# Patient Record
Sex: Female | Born: 1965 | Race: White | Hispanic: No | Smoking: Never smoker
Health system: Southern US, Community
[De-identification: ages and names within clinical notes are randomized; demographics above are authoritative.]

## PROBLEM LIST (undated history)

## (undated) DIAGNOSIS — D649 Anemia, unspecified: Secondary | ICD-10-CM

## (undated) DIAGNOSIS — E119 Type 2 diabetes mellitus without complications: Secondary | ICD-10-CM

## (undated) DIAGNOSIS — E785 Hyperlipidemia, unspecified: Secondary | ICD-10-CM

## (undated) DIAGNOSIS — Z9889 Other specified postprocedural states: Secondary | ICD-10-CM

## (undated) DIAGNOSIS — Z5189 Encounter for other specified aftercare: Secondary | ICD-10-CM

## (undated) DIAGNOSIS — K219 Gastro-esophageal reflux disease without esophagitis: Secondary | ICD-10-CM

## (undated) DIAGNOSIS — E282 Polycystic ovarian syndrome: Secondary | ICD-10-CM

## (undated) DIAGNOSIS — G4730 Sleep apnea, unspecified: Secondary | ICD-10-CM

## (undated) DIAGNOSIS — I10 Essential (primary) hypertension: Secondary | ICD-10-CM

## (undated) DIAGNOSIS — R112 Nausea with vomiting, unspecified: Secondary | ICD-10-CM

## (undated) HISTORY — DX: Hyperlipidemia, unspecified: E78.5

## (undated) HISTORY — DX: Sleep apnea, unspecified: G47.30

## (undated) HISTORY — DX: Gastro-esophageal reflux disease without esophagitis: K21.9

## (undated) HISTORY — PX: CHOLECYSTECTOMY: SHX55

## (undated) HISTORY — DX: Polycystic ovarian syndrome: E28.2

## (undated) HISTORY — DX: Type 2 diabetes mellitus without complications: E11.9

## (undated) HISTORY — DX: Essential (primary) hypertension: I10

## (undated) HISTORY — PX: APPENDECTOMY: SHX54

---

## 1989-05-18 HISTORY — PX: CHOLECYSTECTOMY: SHX55

## 1997-07-01 ENCOUNTER — Observation Stay (HOSPITAL_COMMUNITY): Admission: AD | Admit: 1997-07-01 | Discharge: 1997-07-01 | Payer: Self-pay | Admitting: Obstetrics and Gynecology

## 1997-09-11 ENCOUNTER — Encounter (HOSPITAL_COMMUNITY): Admission: RE | Admit: 1997-09-11 | Discharge: 1997-10-10 | Payer: Self-pay | Admitting: Obstetrics and Gynecology

## 1997-10-09 ENCOUNTER — Inpatient Hospital Stay (HOSPITAL_COMMUNITY): Admission: AD | Admit: 1997-10-09 | Discharge: 1997-10-12 | Payer: Self-pay | Admitting: Obstetrics and Gynecology

## 2001-08-14 ENCOUNTER — Encounter: Payer: Self-pay | Admitting: Emergency Medicine

## 2001-08-14 ENCOUNTER — Emergency Department (HOSPITAL_COMMUNITY): Admission: EM | Admit: 2001-08-14 | Discharge: 2001-08-14 | Payer: Self-pay | Admitting: Emergency Medicine

## 2005-08-05 ENCOUNTER — Encounter: Admission: RE | Admit: 2005-08-05 | Discharge: 2005-09-03 | Payer: Self-pay | Admitting: Orthopedic Surgery

## 2007-11-07 ENCOUNTER — Emergency Department (HOSPITAL_COMMUNITY): Admission: EM | Admit: 2007-11-07 | Discharge: 2007-11-07 | Payer: Self-pay | Admitting: Emergency Medicine

## 2007-12-07 ENCOUNTER — Emergency Department (HOSPITAL_COMMUNITY): Admission: EM | Admit: 2007-12-07 | Discharge: 2007-12-07 | Payer: Self-pay | Admitting: Family Medicine

## 2011-02-12 LAB — POCT PREGNANCY, URINE
Operator id: 239701
Preg Test, Ur: NEGATIVE

## 2013-04-17 ENCOUNTER — Encounter: Payer: Self-pay | Admitting: Obstetrics and Gynecology

## 2013-04-17 ENCOUNTER — Ambulatory Visit (INDEPENDENT_AMBULATORY_CARE_PROVIDER_SITE_OTHER): Payer: No Typology Code available for payment source | Admitting: Obstetrics and Gynecology

## 2013-04-17 ENCOUNTER — Other Ambulatory Visit (HOSPITAL_COMMUNITY)
Admission: RE | Admit: 2013-04-17 | Discharge: 2013-04-17 | Disposition: A | Discharge status: Home / Self Care | Payer: No Typology Code available for payment source | Source: Ambulatory Visit | Attending: Obstetrics and Gynecology | Admitting: Obstetrics and Gynecology

## 2013-04-17 VITALS — BP 136/90 | HR 85 | Temp 97.6°F | Ht 61.0 in | Wt 221.5 lb

## 2013-04-17 DIAGNOSIS — N939 Abnormal uterine and vaginal bleeding, unspecified: Secondary | ICD-10-CM | POA: Insufficient documentation

## 2013-04-17 DIAGNOSIS — N926 Irregular menstruation, unspecified: Secondary | ICD-10-CM | POA: Insufficient documentation

## 2013-04-17 DIAGNOSIS — N946 Dysmenorrhea, unspecified: Secondary | ICD-10-CM | POA: Insufficient documentation

## 2013-04-17 DIAGNOSIS — Z23 Encounter for immunization: Secondary | ICD-10-CM

## 2013-04-17 LAB — POCT PREGNANCY, URINE: Preg Test, Ur: NEGATIVE

## 2013-04-17 MED ORDER — MEGESTROL ACETATE 20 MG PO TABS: 20.0000 mg | ORAL_TABLET | Freq: Every day | ORAL | Status: DC

## 2013-04-17 NOTE — Addendum Note (Signed)
Addended by: Louanna Raw on: 04/17/2013 04:05 PM   Modules accepted: Orders

## 2013-04-17 NOTE — Progress Notes (Signed)
   Subjective:    Patient ID: Teresa Camacho, female    DOB: 03/28/1966, 47 y.o.   MRN: [id number]  HPI 47 yo G4P5 with BMI 41 presenting today for evaluation of abnormal vaginal bleeding. Patient states that over the past several months her menses has lasted longer (7-10 days) and are much heavier with passage of large clots accompanied by severe cramping pain. Patient states her last period lasted 3 weeks. Patient reports being diagnosed with PCOS in the past and required infertility treatment to conceive.   Past Medical History  Diagnosis Date  . PCOS (polycystic ovarian syndrome)   . Sleep apnea    Past Surgical History  Procedure Laterality Date  . Appendectomy    . Cholecystectomy    . Cesarean section      3x   Family History  Problem Relation Age of Onset  . Cancer Mother   . Depression Mother   . Diabetes Mother   . Arthritis Mother   . Heart disease Mother   . Learning disabilities Mother   . Mental illness Mother   . Cancer Father   . Arthritis Father   . Cancer Maternal Aunt     x 2 Aunts   History  Substance Use Topics  . Smoking status: Never Smoker   . Smokeless tobacco: Not on file  . Alcohol Use: No      Review of Systems     Objective:   Physical Exam  GENERAL: Well-developed, well-nourished female in no acute distress. obese  ABDOMEN: Soft, nontender, nondistended. Obese PELVIC: Normal external female genitalia. Vagina is pink and rugated.  Normal discharge. Normal appearing cervix. Bimanual exam limted secondary to size EXTREMITIES: No cyanosis, clubbing, or edema, 2+ distal pulses.      Assessment & Plan:  47 yo with abnormal uterine bleeding - Discussed the need for endometrial biopsy ENDOMETRIAL BIOPSY     The indications for endometrial biopsy were reviewed.   Risks of the biopsy including cramping, bleeding, infection, uterine perforation, inadequate specimen and need for additional procedures  were discussed. The patient states she  understands and agrees to undergo procedure today. Consent was signed. Time out was performed. Urine HCG was negative. A sterile speculum was placed in the patient's vagina and the cervix was prepped with Betadine. A single-toothed tenaculum was placed on the anterior lip of the cervix to stabilize it. The uterine cavity was sounded to a depth of 7 cm using the uterine sound. The 3 mm pipelle was introduced into the endometrial cavity without difficulty, 2 passes were made.  A  moderate amount of tissue was  sent to pathology. The instruments were removed from the patient's vagina. Minimal bleeding from the cervix was noted. The patient tolerated the procedure well.  Routine post-procedure instructions were given to the patient.   - Pelvic ultrasound ordered - RX megace provided - RTC in 2 weeks for results and further management

## 2013-04-17 NOTE — Progress Notes (Signed)
Pt states she bleed for 3 weeks on last period

## 2013-04-18 ENCOUNTER — Ambulatory Visit (HOSPITAL_COMMUNITY)
Admission: RE | Admit: 2013-04-18 | Discharge: 2013-04-18 | Disposition: A | Discharge status: Home / Self Care | Payer: No Typology Code available for payment source | Source: Ambulatory Visit | Attending: Obstetrics and Gynecology | Admitting: Obstetrics and Gynecology

## 2013-04-18 DIAGNOSIS — N938 Other specified abnormal uterine and vaginal bleeding: Secondary | ICD-10-CM | POA: Insufficient documentation

## 2013-04-18 DIAGNOSIS — N939 Abnormal uterine and vaginal bleeding, unspecified: Secondary | ICD-10-CM

## 2013-04-18 DIAGNOSIS — N925 Other specified irregular menstruation: Secondary | ICD-10-CM | POA: Insufficient documentation

## 2013-04-18 DIAGNOSIS — N949 Unspecified condition associated with female genital organs and menstrual cycle: Secondary | ICD-10-CM | POA: Insufficient documentation

## 2013-04-18 DIAGNOSIS — D250 Submucous leiomyoma of uterus: Secondary | ICD-10-CM | POA: Insufficient documentation

## 2013-04-19 ENCOUNTER — Encounter: Payer: Self-pay | Admitting: *Deleted

## 2013-04-19 ENCOUNTER — Telehealth: Payer: Self-pay | Admitting: *Deleted

## 2013-04-19 NOTE — Telephone Encounter (Signed)
[employer] and explained results per Dr. Jolayne Panther and reviewed appointment with her to discuss plan of care for her bleeding.  Teresa Camacho voices understanding. Teresa Camacho also states she did start taking the megace because she started bleeding, but was still bleeding. Reviewed with her to continue taking one a day as prescribed and if that does not help reduce her bleeding within a few days to call back as we may need to adjust dosage. She also asked if she would need to have another biopsy and I informed her to discuss that with Dr. Jolayne Panther at her visit as it may depend on how her bleeding has been ,etc.

## 2013-04-19 NOTE — Telephone Encounter (Signed)
Message copied by Gerome Apley on Wed Apr 19, 2013  4:21 PM ------      Message from: CONSTANT, PEGGY      Created: Wed Apr 19, 2013 12:16 PM       Please inform patient of inadequate biopsy results- no evidence of cancer but too small of a sample to be fully evaluated.            Ultrasound demonstrated the presence of a small fibroid which may be responsible for her abnormal bleeding.            Patient should keep follow up appointment to discuss further management of her bleeding            Thanks            Peggy ------

## 2013-04-20 ENCOUNTER — Telehealth: Payer: Self-pay

## 2013-04-20 NOTE — Telephone Encounter (Signed)
Pt. Called front desk. Called pt. Back. Pt. Stated she wants to have endometrial biopsy repeated prior to her next appointment. Informed pt. That unfortunately we are booked and the best option would be to keep her appointment, talk with Dr. Jolayne Panther and that appointment and discuss doing another endometrial biopsy then-- stated there would probably be no problem with repeating it. Pt. Wondered if there was a cancellation list. Informed pt. That I can put her on one, however, it is best for her to see Dr. Jolayne Panther whose true availability for GYN clinic is not until the date of pt. Appointment. Explained we would do what we could. Pt. Verbalized understanding and gratitude and had no other questions or concerns.

## 2013-04-20 NOTE — Telephone Encounter (Signed)
Called pt. Home phone. Unable to leave message. Left message on cell phone stating we are returning her call and to call clinic.

## 2013-04-20 NOTE — Telephone Encounter (Signed)
Pt. Called stating that a nurse had called her about some results but she has some more questions. Requesting a nurse to call back.

## 2013-04-27 ENCOUNTER — Other Ambulatory Visit: Payer: Self-pay | Admitting: Obstetrics and Gynecology

## 2013-04-27 ENCOUNTER — Ambulatory Visit (HOSPITAL_COMMUNITY)
Admission: RE | Admit: 2013-04-27 | Discharge: 2013-04-27 | Disposition: A | Discharge status: Home / Self Care | Payer: No Typology Code available for payment source | Source: Ambulatory Visit | Attending: Obstetrics and Gynecology | Admitting: Obstetrics and Gynecology

## 2013-04-27 DIAGNOSIS — N939 Abnormal uterine and vaginal bleeding, unspecified: Secondary | ICD-10-CM

## 2013-04-27 DIAGNOSIS — Z1231 Encounter for screening mammogram for malignant neoplasm of breast: Secondary | ICD-10-CM

## 2013-05-03 ENCOUNTER — Encounter: Payer: Self-pay | Admitting: Obstetrics and Gynecology

## 2013-05-03 ENCOUNTER — Ambulatory Visit (INDEPENDENT_AMBULATORY_CARE_PROVIDER_SITE_OTHER): Payer: No Typology Code available for payment source | Admitting: Obstetrics and Gynecology

## 2013-05-03 ENCOUNTER — Other Ambulatory Visit (HOSPITAL_COMMUNITY)
Admission: RE | Admit: 2013-05-03 | Discharge: 2013-05-03 | Disposition: A | Discharge status: Home / Self Care | Payer: No Typology Code available for payment source | Source: Ambulatory Visit | Attending: Obstetrics and Gynecology | Admitting: Obstetrics and Gynecology

## 2013-05-03 VITALS — BP 119/73 | HR 91 | Temp 98.2°F | Ht 65.0 in | Wt 214.7 lb

## 2013-05-03 DIAGNOSIS — D250 Submucous leiomyoma of uterus: Secondary | ICD-10-CM

## 2013-05-03 DIAGNOSIS — N926 Irregular menstruation, unspecified: Secondary | ICD-10-CM

## 2013-05-03 DIAGNOSIS — Z3202 Encounter for pregnancy test, result negative: Secondary | ICD-10-CM

## 2013-05-03 DIAGNOSIS — N939 Abnormal uterine and vaginal bleeding, unspecified: Secondary | ICD-10-CM

## 2013-05-03 HISTORY — DX: Submucous leiomyoma of uterus: D25.0

## 2013-05-03 LAB — POCT PREGNANCY, URINE: Preg Test, Ur: NEGATIVE

## 2013-05-03 MED ORDER — MEGESTROL ACETATE 20 MG PO TABS: 20.0000 mg | ORAL_TABLET | Freq: Two times a day (BID) | ORAL | Status: DC

## 2013-05-03 NOTE — Patient Instructions (Addendum)
Uterine Fibroid A uterine fibroid is a growth (tumor) that occurs in a woman's uterus. This type of tumor is not cancerous and does not spread out of the uterus. A woman can have one or many fibroids, and the fiboid(s) can become quite large. A fibroid can vary in size, weight, and where it grows in the uterus. Most fibroids do not require medical treatment, but some can cause pain or heavy bleeding during and between periods. CAUSES  A fibroid is the result of a single uterine cell that keeps growing (unregulated), which is different than most cells in the human body. Most cells have a control mechanism that keeps them from reproducing without control.  SYMPTOMS   Bleeding.  Pelvic pain and pressure.  Bladder problems due to the size of the fibroid.  Infertility and miscarriages depending on the size and location of the fibroid. DIAGNOSIS  A diagnosis is made by physical exam. Your caregiver may feel the lumpy tumors during a pelvic exam. Important information regarding size, location, and number of tumors can be gained by having an ultrasound. It is rare that other tests, such as a CT scan or MRI, are needed. TREATMENT   Your caregiver may recommend watchful waiting. This involves getting the fibroid checked by your caregiver to see if the fibroids grow or shrink.   Hormonal treatment or an intrauterine device (IUD) may be prescribed.   Surgery may be needed to remove the fibroids (myomectomy) or the uterus (hysterectomy). This depends on your situation. When fibroids interfere with fertility and a woman wants to become pregnant, a caregiver may recommend having the fibroids removed.  HOME CARE INSTRUCTIONS  Home care depends on how you were treated. In general:   Keep all follow-up appointments with your caregiver.   Only take medicine as told by your caregiver. Do not take aspirin. It can cause bleeding.   If you have excessive periods and soak tampons or pads in a half hour or  less, contact your caregiver immediately. If your periods are troublesome but not so heavy, lie down with your feet raised slightly above your heart. Place cold packs on your lower abdomen.   If your periods are heavy, write down the number of pads or tampons you use per month. Bring this information to your caregiver.   Talk to your caregiver about taking iron pills.   Include green vegetables in your diet.   If you were prescribed a hormonal treatment, take the hormonal medicines as directed.   If you need surgery, ask your caregiver for information on your specific surgery.  SEEK IMMEDIATE MEDICAL CARE IF:  You have pelvic pain or cramps not controlled with medicines.   You have a sudden increase in pelvic pain.   You have an increase of bleeding between and during periods.   You feel lightheaded or have fainting episodes.  MAKE SURE YOU:  Understand these instructions.  Will watch your condition.  Will get help right away if you are not doing well or get worse. Document Released: 05/01/2000 Document Revised: 07/27/2011 Document Reviewed: 12/01/2012 [facility] Patient Information 2014 [place], [place]. Medroxyprogesterone injection [Contraceptive] What is this medicine? MEDROXYPROGESTERONE (me DROX ee proe JES te rone) contraceptive injections prevent pregnancy. They provide effective birth control for 3 months. Depo-subQ Provera 104 is also used for treating pain related to endometriosis. This medicine may be used for other purposes; ask your health care provider or pharmacist if you have questions. COMMON BRAND NAME(S): Depo-Provera, Depo-subQ Provera 104 What  should I tell my health care provider before I take this medicine? They need to know if you have any of these conditions: -frequently drink alcohol -asthma -blood vessel disease or a history of a blood clot in the lungs or legs -bone disease such as osteoporosis -breast cancer -diabetes -eating disorder  (anorexia nervosa or bulimia) -high blood pressure -HIV infection or AIDS -kidney disease -liver disease -mental depression -migraine -seizures (convulsions) -stroke -tobacco smoker -vaginal bleeding -an unusual or allergic reaction to medroxyprogesterone, other hormones, medicines, foods, dyes, or preservatives -pregnant or trying to get pregnant -breast-feeding How should I use this medicine? Depo-Provera Contraceptive injection is given into a muscle. Depo-subQ Provera 104 injection is given under the skin. These injections are given by a health care professional. You must not be pregnant before getting an injection. The injection is usually given during the first 5 days after the start of a menstrual period or 6 weeks after delivery of a baby. Talk to your pediatrician regarding the use of this medicine in children. Special care may be needed. These injections have been used in female children who have started having menstrual periods. Overdosage: If you think you have taken too much of this medicine contact a poison control center or emergency room at once. NOTE: This medicine is only for you. Do not share this medicine with others. What if I miss a dose? Try not to miss a dose. You must get an injection once every 3 months to maintain birth control. If you cannot keep an appointment, call and reschedule it. If you wait longer than 13 weeks between Depo-Provera contraceptive injections or longer than 14 weeks between Depo-subQ Provera 104 injections, you could get pregnant. Use another method for birth control if you miss your appointment. You may also need a pregnancy test before receiving another injection. What may interact with this medicine? Do not take this medicine with any of the following medications: -bosentan This medicine may also interact with the following medications: -aminoglutethimide -antibiotics or medicines for infections, especially rifampin, rifabutin,  rifapentine, and griseofulvin -aprepitant -barbiturate medicines such as phenobarbital or primidone -bexarotene -carbamazepine -medicines for seizures like ethotoin, felbamate, oxcarbazepine, phenytoin, topiramate -modafinil -St. John's wort This list may not describe all possible interactions. Give your health care provider a list of all the medicines, herbs, non-prescription drugs, or dietary supplements you use. Also tell them if you smoke, drink alcohol, or use illegal drugs. Some items may interact with your medicine. What should I watch for while using this medicine? This drug does not protect you against HIV infection (AIDS) or other sexually transmitted diseases. Use of this product may cause you to lose calcium from your bones. Loss of calcium may cause weak bones (osteoporosis). Only use this product for more than 2 years if other forms of birth control are not right for you. The longer you use this product for birth control the more likely you will be at risk for weak bones. Ask your health care professional how you can keep strong bones. You may have a change in bleeding pattern or irregular periods. Many females stop having periods while taking this drug. If you have received your injections on time, your chance of being pregnant is very low. If you think you may be pregnant, see your health care professional as soon as possible. Tell your health care professional if you want to get pregnant within the next year. The effect of this medicine may last a long time after you get your  last injection. What side effects may I notice from receiving this medicine? Side effects that you should report to your doctor or health care professional as soon as possible: -allergic reactions like skin rash, itching or hives, swelling of the face, lips, or tongue -breast tenderness or discharge -breathing problems -changes in vision -depression -feeling faint or lightheaded, falls -fever -pain in  the abdomen, chest, groin, or leg -problems with balance, talking, walking -unusually weak or tired -yellowing of the eyes or skin Side effects that usually do not require medical attention (report to your doctor or health care professional if they continue or are bothersome): -acne -fluid retention and swelling -headache -irregular periods, spotting, or absent periods -temporary pain, itching, or skin reaction at site where injected -weight gain This list may not describe all possible side effects. Call your doctor for medical advice about side effects. You may report side effects to FDA at 1-800-FDA-1088. Where should I keep my medicine? This does not apply. The injection will be given to you by a health care professional. NOTE: This sheet is a summary. It may not cover all possible information. If you have questions about this medicine, talk to your doctor, pharmacist, or health care provider.  2014, Elsevier/Gold Standard. (2008-05-25 18:37:56) Levonorgestrel intrauterine device (IUD) What is this medicine? LEVONORGESTREL IUD (LEE voe nor jes trel) is a contraceptive (birth control) device. The device is placed inside the uterus by a healthcare professional. It is used to prevent pregnancy and can also be used to treat heavy bleeding that occurs during your period. Depending on the device, it can be used for 3 to 5 years. This medicine may be used for other purposes; ask your health care provider or pharmacist if you have questions. COMMON BRAND NAME(S): Gretta Cool What should I tell my health care provider before I take this medicine? They need to know if you have any of these conditions: -abnormal Pap smear -cancer of the breast, uterus, or cervix -diabetes -endometritis -genital or pelvic infection now or in the past -have more than one sexual partner or your partner has more than one partner -heart disease -history of an ectopic or tubal pregnancy -immune system  problems -IUD in place -liver disease or tumor -problems with blood clots or take blood-thinners -use intravenous drugs -uterus of unusual shape -vaginal bleeding that has not been explained -an unusual or allergic reaction to levonorgestrel, other hormones, silicone, or polyethylene, medicines, foods, dyes, or preservatives -pregnant or trying to get pregnant -breast-feeding How should I use this medicine? This device is placed inside the uterus by a health care professional. Talk to your pediatrician regarding the use of this medicine in children. Special care may be needed. Overdosage: If you think you have taken too much of this medicine contact a poison control center or emergency room at once. NOTE: This medicine is only for you. Do not share this medicine with others. What if I miss a dose? This does not apply. What may interact with this medicine? Do not take this medicine with any of the following medications: -amprenavir -bosentan -fosamprenavir This medicine may also interact with the following medications: -aprepitant -barbiturate medicines for inducing sleep or treating seizures -bexarotene -griseofulvin -medicines to treat seizures like carbamazepine, ethotoin, felbamate, oxcarbazepine, phenytoin, topiramate -modafinil -pioglitazone -rifabutin -rifampin -rifapentine -some medicines to treat HIV infection like atazanavir, indinavir, lopinavir, nelfinavir, tipranavir, ritonavir -St. John's wort -warfarin This list may not describe all possible interactions. Give your health care provider a list of all the  medicines, herbs, non-prescription drugs, or dietary supplements you use. Also tell them if you smoke, drink alcohol, or use illegal drugs. Some items may interact with your medicine. What should I watch for while using this medicine? Visit your doctor or health care professional for regular check ups. See your doctor if you or your partner has sexual contact with  others, becomes HIV positive, or gets a sexual transmitted disease. This product does not protect you against HIV infection (AIDS) or other sexually transmitted diseases. You can check the placement of the IUD yourself by reaching up to the top of your vagina with clean fingers to feel the threads. Do not pull on the threads. It is a good habit to check placement after each menstrual period. Call your doctor right away if you feel more of the IUD than just the threads or if you cannot feel the threads at all. The IUD may come out by itself. You may become pregnant if the device comes out. If you notice that the IUD has come out use a backup birth control method like condoms and call your health care provider. Using tampons will not change the position of the IUD and are okay to use during your period. What side effects may I notice from receiving this medicine? Side effects that you should report to your doctor or health care professional as soon as possible: -allergic reactions like skin rash, itching or hives, swelling of the face, lips, or tongue -fever, flu-like symptoms -genital sores -high blood pressure -no menstrual period for 6 weeks during use -pain, swelling, warmth in the leg -pelvic pain or tenderness -severe or sudden headache -signs of pregnancy -stomach cramping -sudden shortness of breath -trouble with balance, talking, or walking -unusual vaginal bleeding, discharge -yellowing of the eyes or skin Side effects that usually do not require medical attention (report to your doctor or health care professional if they continue or are bothersome): -acne -breast pain -change in sex drive or performance -changes in weight -cramping, dizziness, or faintness while the device is being inserted -headache -irregular menstrual bleeding within first 3 to 6 months of use -nausea This list may not describe all possible side effects. Call your doctor for medical advice about side  effects. You may report side effects to FDA at 1-800-FDA-1088. Where should I keep my medicine? This does not apply. NOTE: This sheet is a summary. It may not cover all possible information. If you have questions about this medicine, talk to your doctor, pharmacist, or health care provider.  2014, Elsevier/Gold Standard. (2011-06-04 13:54:04) Endometrial Ablation Endometrial ablation removes the lining of the uterus (endometrium). It is usually a same-day, outpatient treatment. Ablation helps avoid major surgery, such as surgery to remove the cervix and uterus (hysterectomy). After endometrial ablation, you will have little or no menstrual bleeding and may not be able to have children. However, if you are premenopausal, you will need to use a reliable method of birth control following the procedure because of the small chance that pregnancy can occur. There are different reasons to have this procedure, which include:  Heavy periods.  Bleeding that is causing anemia.  Irregular bleeding.  Bleeding fibroids on the lining inside the uterus if they are smaller than 3 centimeters. This procedure should not be done if:  You want children in the future.  You have severe cramps with your menstrual period.  You have precancerous or cancerous cells in your uterus.  You were recently pregnant.  You have gone through  menopause.  You have had major surgery on the uterus, such as a cesarean delivery. LET [facility] CARE PROVIDER KNOW ABOUT:  Any allergies you have.  All medicines you are taking, including vitamins, herbs, eye drops, creams, and over-the-counter medicines.  Previous problems you or members of your family have had with the use of anesthetics.  Any blood disorders you have.  Previous surgeries you have had.  Medical conditions you have. RISKS AND COMPLICATIONS  Generally, this is a safe procedure. However, as with any procedure, complications can occur. Possible  complications include:  Perforation of the uterus.  Bleeding.  Infection of the uterus, bladder, or vagina.  Injury to surrounding organs.  An air bubble to the lung (air embolus).  Pregnancy following the procedure.  Failure of the procedure to help the problem, requiring hysterectomy.  Decreased ability to diagnose cancer in the lining of the uterus. BEFORE THE PROCEDURE  The lining of the uterus must be tested to make sure there is no pre-cancerous or cancer cells present.  An ultrasound may be performed to look at the size of the uterus and to check for abnormalities.  Medicines may be given to thin the lining of the uterus. PROCEDURE  During the procedure, your health care provider will use a tool called a resectoscope to help see inside your uterus. There are different ways to remove the lining of your uterus.   Radiofrequency  This method uses a radiofrequency-alternating electric current to remove the lining of the uterus.  Cryotherapy This method uses extreme cold to freeze the lining of the uterus.  Heated-Free Liquid  This method uses heated salt (saline) solution to remove the lining of the uterus.  Microwave This method uses high-energy microwaves to heat up the lining of the uterus to remove it.  Thermal balloon  This method involves inserting a catheter with a balloon tip into the uterus. The balloon tip is filled with heated fluid to remove the lining of the uterus. AFTER THE PROCEDURE  After your procedure, do not have sexual intercourse or insert anything into your vagina until permitted by your health care provider. After the procedure, you may experience:  Cramps.  Vaginal discharge.  Frequent urination. Document Released: 03/13/2004 Document Revised: 01/04/2013 Document Reviewed: 10/05/2012 [facility] Patient Information 2014 [place], [place].

## 2013-05-03 NOTE — Progress Notes (Signed)
Patient ID: Teresa Camacho, female   DOB: 1965/08/27, 47 y.o.   MRN: [id number] Patient here for repeat endometrial biopsy secondary to insufficient specimen last time. Results of the ultrasound were also reviewed and explained  FINDINGS:  Uterus  Measurements: 13.4 x 8.1 x 8.2 cm. Prior C-section scar noted. A  submucosal fibroid is seen in the right uterine corpus which indents  the endometrial stripe. This measures 2.7 x 2.7 x 2.1 cm.  Endometrium  Thickness: 16 mm. No focal abnormality visualized other than  submucosal fibroid noted above.  Right ovary  Measurements: Not directly visualized by transabdominal or  transvaginal sonography, however no adnexal mass identified.  Left ovary  Measurements: Not directly visualized by transabdominal or  transvaginal sonography, however no adnexal mass identified.  Other findings  No free fluid.  IMPRESSION:  2.7 cm submucosal fibroid in the right uterine corpus, which indents  the endometrial stripe.  Endometrial thickness measures 16 mm. If bleeding remains  unresponsive to hormonal or medical therapy, focal lesion work-up  with sonohysterogram should be considered. Endometrial biopsy should  also be considered in pre-menopausal patients at high risk for  endometrial carcinoma. (Ref: Radiological Reasoning: Algorithmic  Workup of Abnormal Vaginal Bleeding with Endovaginal Sonography and  Sonohysterography. AJR 2008; 295:M84-13).  Nonvisualization of ovaries, however no adnexal mass identified.   ENDOMETRIAL BIOPSY     The indications for endometrial biopsy were reviewed.   Risks of the biopsy including cramping, bleeding, infection, uterine perforation, inadequate specimen and need for additional procedures  were discussed. The patient states she understands and agrees to undergo procedure today. Consent was signed. Time out was performed. Urine HCG was negative. A sterile speculum was placed in the patient's vagina and the cervix was  prepped with Betadine. A single-toothed tenaculum was placed on the anterior lip of the cervix to stabilize it. The uterine cavity was sounded to a depth of 12 cm using the uterine sound. The 3 mm pipelle was introduced into the endometrial cavity without difficulty, 2 passes were made.  A  moderate amount of tissue was  sent to pathology. The instruments were removed from the patient's vagina. Minimal bleeding from the cervix was noted. The patient tolerated the procedure well.  Routine post-procedure instructions were given to the patient. The patient will follow up in two weeks to review the results and for further management.   A/P 47 yo with abnormal uterine bleeding and submucosal fibroid - Repeat endometrial biopsy performed today - Discussed medical management with depo-Provera, Mirena IUD or endometrial ablation. Reading information provided - patient will be contacted with any abnormal results and will let us know what her management plan will be after reviewing her options

## 2013-05-05 ENCOUNTER — Telehealth: Payer: Self-pay | Admitting: *Deleted

## 2013-05-05 ENCOUNTER — Encounter: Payer: Self-pay | Admitting: *Deleted

## 2013-05-05 NOTE — Telephone Encounter (Addendum)
Message copied by Jill Side on Fri May 05, 2013 12:12 PM ------      Message from: Catalina Antigua      Created: Fri May 05, 2013  7:57 AM       Please inform patient of negative endometrial biopsy results. Patient was counseled last time regarding medical management (depo-provera or Mirena IUD) vs surgical management with endometrial ablation.      If patient voices a preference for endometrial ablation, please let me know so that I can schedule her with [place]      If she chooses depo-provera, please schedule her for nursing visit at her convenience      If she choosed IUD, she may keep her 2 week f/u appointment for IUD insertion            Thanks            Peggy            ----- Message -----         From: [facility] [place] [facility]         Sent: 05/03/2013   2:01 PM           To: Catalina Antigua, MD                   ------ Called pt and informed her of negative endometrial biopsy results. I asked if she has decided on a treatment option for her abnormal bleeding. She stated that she is still thinking about it. I reviewed the options again per Dr. Deretha Emory note and pt had no questions. She will call for appt after making her decision.   Diane Day RNC

## 2013-05-05 NOTE — Telephone Encounter (Signed)
Erroneous encounter

## 2013-05-15 ENCOUNTER — Encounter: Payer: Self-pay | Admitting: *Deleted

## 2013-05-25 ENCOUNTER — Other Ambulatory Visit: Payer: Self-pay | Admitting: Advanced Practice Midwife

## 2013-05-25 ENCOUNTER — Encounter (HOSPITAL_COMMUNITY): Payer: Self-pay | Admitting: Emergency Medicine

## 2013-05-25 ENCOUNTER — Observation Stay (HOSPITAL_COMMUNITY)
Admission: EM | Admit: 2013-05-25 | Discharge: 2013-05-27 | Disposition: A | Discharge status: Home / Self Care | Payer: No Typology Code available for payment source | Attending: Obstetrics & Gynecology | Admitting: Obstetrics & Gynecology

## 2013-05-25 DIAGNOSIS — N390 Urinary tract infection, site not specified: Secondary | ICD-10-CM | POA: Insufficient documentation

## 2013-05-25 DIAGNOSIS — N939 Abnormal uterine and vaginal bleeding, unspecified: Secondary | ICD-10-CM

## 2013-05-25 DIAGNOSIS — D250 Submucous leiomyoma of uterus: Secondary | ICD-10-CM | POA: Insufficient documentation

## 2013-05-25 DIAGNOSIS — N920 Excessive and frequent menstruation with regular cycle: Principal | ICD-10-CM | POA: Insufficient documentation

## 2013-05-25 DIAGNOSIS — N949 Unspecified condition associated with female genital organs and menstrual cycle: Secondary | ICD-10-CM | POA: Insufficient documentation

## 2013-05-25 DIAGNOSIS — N925 Other specified irregular menstruation: Secondary | ICD-10-CM | POA: Insufficient documentation

## 2013-05-25 DIAGNOSIS — D649 Anemia, unspecified: Secondary | ICD-10-CM | POA: Insufficient documentation

## 2013-05-25 DIAGNOSIS — R55 Syncope and collapse: Secondary | ICD-10-CM | POA: Insufficient documentation

## 2013-05-25 DIAGNOSIS — N938 Other specified abnormal uterine and vaginal bleeding: Secondary | ICD-10-CM | POA: Diagnosis present

## 2013-05-25 LAB — COMPREHENSIVE METABOLIC PANEL
ALT: 15 U/L (ref 0–35)
AST: 15 U/L (ref 0–37)
Albumin: 3.8 g/dL (ref 3.5–5.2)
Alkaline Phosphatase: 61 U/L (ref 39–117)
BILIRUBIN TOTAL: 0.7 mg/dL (ref 0.3–1.2)
BUN: 11 mg/dL (ref 6–23)
CHLORIDE: 104 meq/L (ref 96–112)
CO2: 21 mEq/L (ref 19–32)
Calcium: 9 mg/dL (ref 8.4–10.5)
Creatinine, Ser: 0.59 mg/dL (ref 0.50–1.10)
GFR calc non Af Amer: >90 mL/min (ref >90)
GLUCOSE: 201 mg/dL — AB (ref 70–99)
Potassium: 3.6 mEq/L — ABNORMAL LOW (ref 3.7–5.3)
Sodium: 141 mEq/L (ref 137–147)
TOTAL PROTEIN: 7.5 g/dL (ref 6.0–8.3)

## 2013-05-25 LAB — CBC WITH DIFFERENTIAL/PLATELET
BASOS PCT: 0 % (ref 0–1)
Basophils Absolute: 0 10*3/uL (ref 0.0–0.1)
EOS ABS: 0.3 10*3/uL (ref 0.0–0.7)
Eosinophils Relative: 2 % (ref 0–5)
HCT: 24.8 % — ABNORMAL LOW (ref 36.0–46.0)
HEMOGLOBIN: 7.6 g/dL — AB (ref 12.0–15.0)
Lymphocytes Relative: 30 % (ref 12–46)
Lymphs Abs: 3.3 10*3/uL (ref 0.7–4.0)
MCH: 23.4 pg — AB (ref 26.0–34.0)
MCHC: 30.6 g/dL (ref 30.0–36.0)
MCV: 76.3 fL — AB (ref 78.0–100.0)
MONO ABS: 0.6 10*3/uL (ref 0.1–1.0)
MONOS PCT: 6 % (ref 3–12)
Neutro Abs: 6.9 10*3/uL (ref 1.7–7.7)
Neutrophils Relative %: 62 % (ref 43–77)
Platelets: 480 10*3/uL — ABNORMAL HIGH (ref 150–400)
RBC: 3.25 MIL/uL — ABNORMAL LOW (ref 3.87–5.11)
RDW: 15 % (ref 11.5–15.5)
WBC: 11 10*3/uL — ABNORMAL HIGH (ref 4.0–10.5)

## 2013-05-25 LAB — TYPE AND SCREEN
ABO/RH(D): O POS
Antibody Screen: NEGATIVE

## 2013-05-25 LAB — URINALYSIS, ROUTINE W REFLEX MICROSCOPIC
BILIRUBIN URINE: NEGATIVE
GLUCOSE, UA: 100 mg/dL — AB
KETONES UR: NEGATIVE mg/dL
Nitrite: POSITIVE — AB
PROTEIN: NEGATIVE mg/dL
Specific Gravity, Urine: 1.022 (ref 1.005–1.030)
Urobilinogen, UA: 0.2 mg/dL (ref 0.0–1.0)
pH: 5 (ref 5.0–8.0)

## 2013-05-25 LAB — URINE MICROSCOPIC-ADD ON

## 2013-05-25 LAB — PREGNANCY, URINE: Preg Test, Ur: NEGATIVE

## 2013-05-25 LAB — ABO/RH: ABO/RH(D): O POS

## 2013-05-25 MED ORDER — SODIUM CHLORIDE 0.9 % IV SOLN
INTRAVENOUS | Status: DC
  Administered 2013-05-26: 01:00:00 via INTRAVENOUS

## 2013-05-25 MED ORDER — ZOLPIDEM TARTRATE 5 MG PO TABS: 5.0000 mg | ORAL_TABLET | Freq: Every evening | ORAL | Status: DC | PRN

## 2013-05-25 MED ORDER — SULFAMETHOXAZOLE-TMP DS 800-160 MG PO TABS
1.0000 | ORAL_TABLET | Freq: Two times a day (BID) | ORAL | Status: DC
  Administered 2013-05-26 – 2013-05-27 (×4): 1 via ORAL
  Filled 2013-05-25 (×4): qty 1

## 2013-05-25 MED ORDER — ACETAMINOPHEN 325 MG PO TABS
650.0000 mg | ORAL_TABLET | ORAL | Status: DC | PRN
  Administered 2013-05-26: 650 mg via ORAL
  Filled 2013-05-25: qty 2

## 2013-05-25 MED ORDER — MEGESTROL ACETATE 40 MG PO TABS
40.0000 mg | ORAL_TABLET | Freq: Three times a day (TID) | ORAL | Status: DC
  Administered 2013-05-26 – 2013-05-27 (×5): 40 mg via ORAL
  Filled 2013-05-25 (×6): qty 1

## 2013-05-25 MED ORDER — SODIUM CHLORIDE 0.9 % IV BOLUS (SEPSIS)
1000.0000 mL | Freq: Once | INTRAVENOUS | Status: AC
  Administered 2013-05-25: 1000 mL via INTRAVENOUS

## 2013-05-25 NOTE — ED Provider Notes (Signed)
CSN: [id number]     Arrival date & time 05/25/13  1726 History   First MD Initiated Contact with Patient 05/25/13 2048     Chief Complaint  Patient presents with  . Vaginal Bleeding   (Consider location/radiation/quality/duration/timing/severity/associated sxs/prior Treatment) HPI Comments: 48 year old female presents with worsening vaginal bleeding for the past 3 months. Before this she was having heavy irregular periods. She's been seen by 2 different gynecologists, one on [address] and 1 at [facility]. She had 2 endometrial biopsies which were both inconclusive and she has not received the results of the second test. She's also been nauseous recently and has had multiple syncopal episodes. He seemed to be what she is getting up or walking around. She feels constantly lightheaded, worse with standing. Friend states she's been paler. She's not any diarrhea. No urinary symptoms. She's having clots with the vaginal bleeding. She's currently using tampons because the clots and rolling off of her pads. She states she is between 10-16 tampons per day. She's currently on Megace but does not seem to notice any difference. She is also on iron. She's not know what her normal hemoglobin is. She has a new appointment to with a new gynecologist but not for another couple weeks.   Past Medical History  Diagnosis Date  . PCOS (polycystic ovarian syndrome)   . Sleep apnea    Past Surgical History  Procedure Laterality Date  . Appendectomy    . Cholecystectomy    . Cesarean section      3x   Family History  Problem Relation Age of Onset  . Cancer Mother   . Depression Mother   . Diabetes Mother   . Arthritis Mother   . Heart disease Mother   . Learning disabilities Mother   . Mental illness Mother   . Cancer Father   . Arthritis Father   . Cancer Maternal Aunt     x 2 Aunts   History  Substance Use Topics  . Smoking status: Never Smoker   . Smokeless tobacco: Not on file  . Alcohol  Use: No   OB History   Grav Para Term Preterm Abortions TAB SAB Ect Mult Living                 Review of Systems  Constitutional: Positive for fatigue. Negative for fever.  Respiratory: Negative for shortness of breath.   Cardiovascular: Negative for chest pain.  Gastrointestinal: Positive for nausea and abdominal pain. Negative for vomiting.  Genitourinary: Positive for vaginal bleeding. Negative for dysuria and frequency.  Neurological: Positive for syncope, weakness and light-headedness.  All other systems reviewed and are negative.    Allergies  Codeine  Home Medications   Current Outpatient Rx  Name  Route  Sig  Dispense  Refill  . Cyanocobalamin (VITAMIN B-12 IJ)   Injection   Inject 1 Applicatorful as directed See admin instructions. Had one shot on 05/19/13 and 05/23/13.         . ferrous fumarate (HEMOCYTE - 106 MG FE) 325 (106 FE) MG TABS tablet   Oral   Take 1 tablet by mouth daily.         . megestrol (MEGACE) 20 MG tablet   Oral   Take 1 tablet (20 mg total) by mouth 2 (two) times daily.   60 tablet   1   . Multiple Vitamins-Minerals (MULTIVITAMIN WITH MINERALS) tablet   Oral   Take 1 tablet by mouth daily.  BP 131/65  Pulse 106  Temp(Src) 97.1 F (36.2 C) (Oral)  Resp 28  Wt 215 lb (97.523 kg)  SpO2 99%  LMP 04/17/2013 Physical Exam  Nursing note and vitals reviewed. Constitutional: She is oriented to person, place, and time. She appears well-developed and well-nourished. No distress.  HENT:  Head: Normocephalic and atraumatic.  Right Ear: External ear normal.  Left Ear: External ear normal.  Nose: Nose normal.  Eyes: Right eye exhibits no discharge. Left eye exhibits no discharge.  Cardiovascular: Regular rhythm and normal heart sounds.  Tachycardia present.   No murmur heard. Pulmonary/Chest: Effort normal and breath sounds normal.  Abdominal: Soft. There is tenderness in the suprapubic area.  Genitourinary: Uterus is not  tender. Cervix exhibits no motion tenderness. There is bleeding (minimal) around the vagina.  Neurological: She is alert and oriented to person, place, and time.  Skin: Skin is warm and dry. There is pallor.    ED Course  Procedures (including critical care time) Labs Review Labs Reviewed  URINALYSIS, ROUTINE W REFLEX MICROSCOPIC - Abnormal; Notable for the following:    APPearance HAZY (*)    Glucose, UA 100 (*)    Hgb urine dipstick LARGE (*)    Nitrite POSITIVE (*)    Leukocytes, UA SMALL (*)    All other components within normal limits  CBC WITH DIFFERENTIAL - Abnormal; Notable for the following:    WBC 11.0 (*)    RBC 3.25 (*)    Hemoglobin 7.6 (*)    HCT 24.8 (*)    MCV 76.3 (*)    MCH 23.4 (*)    Platelets 480 (*)    All other components within normal limits  COMPREHENSIVE METABOLIC PANEL - Abnormal; Notable for the following:    Potassium 3.6 (*)    Glucose, Bld 201 (*)    All other components within normal limits  URINE MICROSCOPIC-ADD ON - Abnormal; Notable for the following:    Squamous Epithelial / LPF FEW (*)    Bacteria, UA FEW (*)    All other components within normal limits  URINE CULTURE  PREGNANCY, URINE  TYPE AND SCREEN  ABO/RH   Imaging Review No results found.  EKG Interpretation   None       MDM   1. Abnormal uterine bleeding   2. Anemia   3. Syncope    Patient is symptomatically anemic with her multiple syncopal and near-syncopal episodes. Does not perfuse bleeding on my exam, but given her history I feel she needs urgent gynecologic eval and likely blood transfusion. Her hemoglobin may be falsely elevated she's also likely dehydrated. We'll give fluids and after talking with Dr. Purvis Kilts will transfer to [facility] hospital for their evaluation.    Ephraim Hamburger, MD 05/25/13 [phone]

## 2013-05-25 NOTE — ED Notes (Signed)
Vaginal bleeding for several months.lmp normal unable to say.  Nausea diarrhea

## 2013-05-25 NOTE — ED Notes (Signed)
Pt c/o vaginal bleeding x2 weeks, condition worsening and energy level decreased

## 2013-05-26 ENCOUNTER — Encounter (HOSPITAL_COMMUNITY): Payer: Self-pay

## 2013-05-26 DIAGNOSIS — N949 Unspecified condition associated with female genital organs and menstrual cycle: Secondary | ICD-10-CM

## 2013-05-26 DIAGNOSIS — D250 Submucous leiomyoma of uterus: Secondary | ICD-10-CM

## 2013-05-26 DIAGNOSIS — N925 Other specified irregular menstruation: Secondary | ICD-10-CM

## 2013-05-26 DIAGNOSIS — N920 Excessive and frequent menstruation with regular cycle: Secondary | ICD-10-CM

## 2013-05-26 DIAGNOSIS — N938 Other specified abnormal uterine and vaginal bleeding: Secondary | ICD-10-CM

## 2013-05-26 DIAGNOSIS — D649 Anemia, unspecified: Secondary | ICD-10-CM

## 2013-05-26 LAB — CBC
HCT: 21.5 % — ABNORMAL LOW (ref 36.0–46.0)
HCT: 28.2 % — ABNORMAL LOW (ref 36.0–46.0)
Hemoglobin: 6.6 g/dL — CL (ref 12.0–15.0)
Hemoglobin: 8.9 g/dL — ABNORMAL LOW (ref 12.0–15.0)
MCH: 23.1 pg — AB (ref 26.0–34.0)
MCH: 23.9 pg — ABNORMAL LOW (ref 26.0–34.0)
MCHC: 30.7 g/dL (ref 30.0–36.0)
MCHC: 31.6 g/dL (ref 30.0–36.0)
MCV: 75.2 fL — ABNORMAL LOW (ref 78.0–100.0)
MCV: 75.6 fL — AB (ref 78.0–100.0)
PLATELETS: 376 10*3/uL (ref 150–400)
Platelets: 379 10*3/uL (ref 150–400)
RBC: 2.86 MIL/uL — ABNORMAL LOW (ref 3.87–5.11)
RBC: 3.73 MIL/uL — ABNORMAL LOW (ref 3.87–5.11)
RDW: 14.9 % (ref 11.5–15.5)
RDW: 15.1 % (ref 11.5–15.5)
WBC: 10.2 10*3/uL (ref 4.0–10.5)
WBC: 9 10*3/uL (ref 4.0–10.5)

## 2013-05-26 LAB — HEMOGLOBIN A1C
Hgb A1c MFr Bld: 6.6 % — ABNORMAL HIGH (ref <5.7)
Mean Plasma Glucose: 143 mg/dL — ABNORMAL HIGH (ref <117)

## 2013-05-26 LAB — PREPARE RBC (CROSSMATCH)

## 2013-05-26 LAB — ABO/RH: ABO/RH(D): O POS

## 2013-05-26 LAB — GLUCOSE, CAPILLARY: GLUCOSE-CAPILLARY: 138 mg/dL — AB (ref 70–99)

## 2013-05-26 MED ORDER — ESTROGENS CONJUGATED 25 MG IJ SOLR
25.0000 mg | Freq: Once | INTRAMUSCULAR | Status: AC
  Administered 2013-05-26: 25 mg via INTRAVENOUS
  Filled 2013-05-26: qty 25

## 2013-05-26 MED ORDER — OXYCODONE-ACETAMINOPHEN 5-325 MG PO TABS
2.0000 | ORAL_TABLET | Freq: Four times a day (QID) | ORAL | Status: DC | PRN
  Administered 2013-05-26 (×3): 1 via ORAL
  Filled 2013-05-26 (×4): qty 2

## 2013-05-26 MED ORDER — ACETAMINOPHEN 325 MG PO TABS
650.0000 mg | ORAL_TABLET | Freq: Once | ORAL | Status: AC
  Administered 2013-05-26: 650 mg via ORAL
  Filled 2013-05-26 (×2): qty 2

## 2013-05-26 NOTE — Progress Notes (Signed)
UR completed 

## 2013-05-26 NOTE — H&P (Signed)
Teresa Camacho is an 48 y.o. female.[id number] (twin pregnancy with death of 46 and 2nd twin pregnancy) Presents with c/o heavy bleeding for 2 years that became much worse over the past 2 1/2 months.  During the last 2 1/2 months the bleeding has been continuous with clots.  Last night pt presented to [facility] due to passing out.  She reports weakness and partial fainting spells for 2 months.  She was seen intermittently by different providers.  End of Oct she reports 'full out hemorrhaging.'   Pt had Endobx in Dec of 2014.  She has been on Megace.  Has a f/u appt with Dr. Corinna Capra on 05/31/2013.  The bleeding is assoc with pain.   Pertinent Gynecological History: Menses: continuous for the past 2 1/2 months Bleeding: dysfunctional uterine bleeding Contraception: vasectomy DES exposure: unknown Blood transfusions: possibly after a c-section Sexually transmitted diseases: no past history Previous GYN Procedures: c-section x3  Last mammogram: normal Date: 04/2013 Last pap: normal Date: 2000   Menstrual History:  Patient's last menstrual period was 04/17/2013.    Past Medical History  Diagnosis Date  . PCOS (polycystic ovarian syndrome)   . Sleep apnea     Past Surgical History  Procedure Laterality Date  . Appendectomy    . Cholecystectomy    . Cesarean section      3x  . Cholecystectomy  1991    Family History  Problem Relation Age of Onset  . Cancer Mother   . Depression Mother   . Diabetes Mother   . Arthritis Mother   . Heart disease Mother   . Learning disabilities Mother   . Mental illness Mother   . Cancer Father   . Arthritis Father   . Cancer Maternal Aunt     x 2 Aunts    Social History:  reports that she has never smoked. She does not have any smokeless tobacco history on file. She reports that she does not drink alcohol or use illicit drugs.  Allergies:  Allergies  Allergen Reactions  . Codeine Nausea And Vomiting    Prescriptions prior to admission   Medication Sig Dispense Refill  . Cyanocobalamin (VITAMIN B-12 IJ) Inject 1 Applicatorful as directed See admin instructions. Had one shot on 05/19/13 and 05/23/13.      . ferrous fumarate (HEMOCYTE - 106 MG FE) 325 (106 FE) MG TABS tablet Take 1 tablet by mouth daily.      . megestrol (MEGACE) 20 MG tablet Take 1 tablet (20 mg total) by mouth 2 (two) times daily.  60 tablet  1  . Multiple Vitamins-Minerals (MULTIVITAMIN WITH MINERALS) tablet Take 1 tablet by mouth daily.        ROS  Blood pressure 106/66, pulse 88, temperature 98.3 F (36.8 C), temperature source Oral, resp. rate 18, height 5\' 1"  (1.549 m), weight 215 lb (97.523 kg), last menstrual period 04/17/2013, SpO2 99.00%. Physical ExamPt in NAD Lungs: CTA CV:  RRR Abd: obese, multiple well healed incisions; NT, ND GU: not done   Results for orders placed during the hospital encounter of 05/25/13 (from the past 24 hour(s))  CBC WITH DIFFERENTIAL     Status: Abnormal   Collection Time    05/25/13  5:38 PM      Result Value Range   WBC 11.0 (*) 4.0 - 10.5 K/uL   RBC 3.25 (*) 3.87 - 5.11 MIL/uL   Hemoglobin 7.6 (*) 12.0 - 15.0 g/dL   HCT 24.8 (*) 36.0 - 46.0 %  MCV 76.3 (*) 78.0 - 100.0 fL   MCH 23.4 (*) 26.0 - 34.0 pg   MCHC 30.6  30.0 - 36.0 g/dL   RDW 15.0  11.5 - 15.5 %   Platelets 480 (*) 150 - 400 K/uL   Neutrophils Relative % 62  43 - 77 %   Neutro Abs 6.9  1.7 - 7.7 K/uL   Lymphocytes Relative 30  12 - 46 %   Lymphs Abs 3.3  0.7 - 4.0 K/uL   Monocytes Relative 6  3 - 12 %   Monocytes Absolute 0.6  0.1 - 1.0 K/uL   Eosinophils Relative 2  0 - 5 %   Eosinophils Absolute 0.3  0.0 - 0.7 K/uL   Basophils Relative 0  0 - 1 %   Basophils Absolute 0.0  0.0 - 0.1 K/uL  COMPREHENSIVE METABOLIC PANEL     Status: Abnormal   Collection Time    05/25/13  5:38 PM      Result Value Range   Sodium 141  137 - 147 mEq/L   Potassium 3.6 (*) 3.7 - 5.3 mEq/L   Chloride 104  96 - 112 mEq/L   CO2 21  19 - 32 mEq/L   Glucose, Bld  201 (*) 70 - 99 mg/dL   BUN 11  6 - 23 mg/dL   Creatinine, Ser 0.59  0.50 - 1.10 mg/dL   Calcium 9.0  8.4 - 10.5 mg/dL   Total Protein 7.5  6.0 - 8.3 g/dL   Albumin 3.8  3.5 - 5.2 g/dL   AST 15  0 - 37 U/L   ALT 15  0 - 35 U/L   Alkaline Phosphatase 61  39 - 117 U/L   Total Bilirubin 0.7  0.3 - 1.2 mg/dL   GFR calc non Af Amer >90  >90 mL/min   GFR calc Af Amer >90  >90 mL/min  URINALYSIS, ROUTINE W REFLEX MICROSCOPIC     Status: Abnormal   Collection Time    05/25/13  5:43 PM      Result Value Range   Color, Urine YELLOW  YELLOW   APPearance HAZY (*) CLEAR   Specific Gravity, Urine 1.022  1.005 - 1.030   pH 5.0  5.0 - 8.0   Glucose, UA 100 (*) NEGATIVE mg/dL   Hgb urine dipstick LARGE (*) NEGATIVE   Bilirubin Urine NEGATIVE  NEGATIVE   Ketones, ur NEGATIVE  NEGATIVE mg/dL   Protein, ur NEGATIVE  NEGATIVE mg/dL   Urobilinogen, UA 0.2  0.0 - 1.0 mg/dL   Nitrite POSITIVE (*) NEGATIVE   Leukocytes, UA SMALL (*) NEGATIVE  PREGNANCY, URINE     Status: None   Collection Time    05/25/13  5:43 PM      Result Value Range   Preg Test, Ur NEGATIVE  NEGATIVE  URINE MICROSCOPIC-ADD ON     Status: Abnormal   Collection Time    05/25/13  5:43 PM      Result Value Range   Squamous Epithelial / LPF FEW (*) RARE   WBC, UA 7-10  <3 WBC/hpf   RBC / HPF 21-50  <3 RBC/hpf   Bacteria, UA FEW (*) RARE   Urine-Other MUCOUS PRESENT    TYPE AND SCREEN     Status: None   Collection Time    05/25/13  9:08 PM      Result Value Range   ABO/RH(D) O POS     Antibody Screen NEG  Sample Expiration 05/28/2013    ABO/RH     Status: None   Collection Time    05/25/13  9:35 PM      Result Value Range   ABO/RH(D) O POS    CBC     Status: Abnormal   Collection Time    05/26/13 12:59 AM      Result Value Range   WBC 10.2  4.0 - 10.5 K/uL   RBC 2.86 (*) 3.87 - 5.11 MIL/uL   Hemoglobin 6.6 (*) 12.0 - 15.0 g/dL   HCT 21.5 (*) 36.0 - 46.0 %   MCV 75.2 (*) 78.0 - 100.0 fL   MCH 23.1 (*) 26.0 -  34.0 pg   MCHC 30.7  30.0 - 36.0 g/dL   RDW 14.9  11.5 - 15.5 %   Platelets 376  150 - 400 K/uL  PREPARE RBC (CROSSMATCH)     Status: None   Collection Time    05/26/13  2:00 AM      Result Value Range   Order Confirmation ORDER PROCESSED BY BLOOD BANK    TYPE AND SCREEN     Status: None   Collection Time    05/26/13  2:40 AM      Result Value Range   ABO/RH(D) O POS     Antibody Screen NEG     Sample Expiration 05/29/2013     Unit Number [id number]     Blood Component Type RED CELLS,LR     Unit division 00     Status of Unit ISSUED     Transfusion Status OK TO TRANSFUSE     Crossmatch Result Compatible     Unit Number [id number]     Blood Component Type RED CELLS,LR     Unit division 00     Status of Unit ISSUED     Transfusion Status OK TO TRANSFUSE     Crossmatch Result Compatible    ABO/RH     Status: None   Collection Time    05/26/13  2:40 AM      Result Value Range   ABO/RH(D) O POS    05/03/2013 Diagnosis Endometrium, biopsy - INACTIVE ENDOMETRIUM WITH PROGESTATIONAL CHANGES. NO HYPERPLASIA OR CARCINOMA.  04/18/2013 EXAM:  TRANSABDOMINAL AND TRANSVAGINAL ULTRASOUND OF PELVIS  TECHNIQUE:  Both transabdominal and transvaginal ultrasound examinations of the  pelvis were performed. Transabdominal technique was performed for  global imaging of the pelvis including uterus, ovaries, adnexal  regions, and pelvic cul-de-sac. It was necessary to proceed with  endovaginal exam following the transabdominal exam to visualize the  endometrium and ovaries.  COMPARISON: None  FINDINGS:  Uterus  Measurements: 13.4 x 8.1 x 8.2 cm. Prior C-section scar noted. A  submucosal fibroid is seen in the right uterine corpus which indents  the endometrial stripe. This measures 2.7 x 2.7 x 2.1 cm.  Endometrium  Thickness: 16 mm. No focal abnormality visualized other than  submucosal fibroid noted above.  Right ovary  Measurements: Not directly visualized by transabdominal  or  transvaginal sonography, however no adnexal mass identified.  Left ovary  Measurements: Not directly visualized by transabdominal or  transvaginal sonography, however no adnexal mass identified.  Other findings  No free fluid.  IMPRESSION:  2.7 cm submucosal fibroid in the right uterine corpus, which indents  the endometrial stripe.  Endometrial thickness measures 16 mm. If bleeding remains  unresponsive to hormonal or medical therapy, focal lesion work-up  with sonohysterogram should be considered. Endometrial biopsy should  also  be considered in pre-menopausal patients at high risk for  endometrial carcinoma. (Ref: Radiological Reasoning: Algorithmic  Workup of Abnormal Vaginal Bleeding with Endovaginal Sonography and  Sonohysterography. AJR 2008; 355:H74-16).  Nonvisualization of ovaries, however no adnexal mass identified.  No results found.  Assessment/Plan: Menometrorrhagia thought due to submucosal fibroid- d/w pt treatment options including hysteroscopic fibroid resection with endometrial ablation following. Pt is high risk for surgery due to multiple prev surgeries and obesity.    Admit for blood transfusion HgA1C Repeat CBC 4 hours after transfusion Discharge to home later today after transfusion Increase Meagce to 40mg  tid Bactrim for UTI   HARRAWAY-SMITH, Dorthula Bier 05/26/2013, 7:06 AM

## 2013-05-26 NOTE — Progress Notes (Signed)
CRITICAL VALUE ALERT  Critical value received:  Hemoglobin 6.6  Date of notification:   05/26/13  Time of notification:  0115  Critical value read back: yes Nurse who received alert:  Mardene Sayer, RN  MD notified (1st page):   Ria Bush. CNM  Time of first page:   0120  MD notified (2nd page): n/a  Time of second page: n/a  Responding MD:   Ria Bush, CNM  Time MD responded:  [phone]

## 2013-05-26 NOTE — Progress Notes (Signed)
Inpatient Diabetes Program Recommendations  AACE/ADA: New Consensus Statement on Inpatient Glycemic Control (2013)  Target Ranges:  Prepandial:   less than 140 mg/dL      Peak postprandial:   less than 180 mg/dL (1-2 hours)      Critically ill patients:  140 - 180 mg/dL   Results for Teresa Camacho, Teresa Camacho (MRN [id number]) as of 05/26/2013 08:13  Ref. Range 05/25/2013 17:38  Glucose Latest Range: 70-99 mg/dL 201 (H)    Inpatient Diabetes Program Recommendations Correction (SSI): No history of diabetes.  However, initial lab glucose 201 mg/dl on 05/25/13@17 :38.  May want to monitor CBGs and order Novolog correction if necessary. HgbA1C: May want to consider ordering an A1C to determine glycemic control over the past 2-3 months.  Thanks, Barnie Alderman, RN, MSN, CCRN Diabetes Coordinator Inpatient Diabetes Program [phone] (Team Pager) [phone] ([facility] office) [phone] [facility] office)

## 2013-05-27 LAB — CBC
HCT: 30.8 % — ABNORMAL LOW (ref 36.0–46.0)
Hemoglobin: 9.8 g/dL — ABNORMAL LOW (ref 12.0–15.0)
MCH: 24.1 pg — AB (ref 26.0–34.0)
MCHC: 31.8 g/dL (ref 30.0–36.0)
MCV: 75.9 fL — ABNORMAL LOW (ref 78.0–100.0)
PLATELETS: 361 10*3/uL (ref 150–400)
RBC: 4.06 MIL/uL (ref 3.87–5.11)
RDW: 15.2 % (ref 11.5–15.5)
WBC: 11 10*3/uL — ABNORMAL HIGH (ref 4.0–10.5)

## 2013-05-27 LAB — TYPE AND SCREEN
ABO/RH(D): O POS
Antibody Screen: NEGATIVE
UNIT DIVISION: 0
UNIT DIVISION: 0
Unit division: 0

## 2013-05-27 LAB — URINE CULTURE: Colony Count: >=100000

## 2013-05-27 MED ORDER — PROMETHAZINE HCL 25 MG PO TABS
25.0000 mg | ORAL_TABLET | Freq: Once | ORAL | Status: AC
  Administered 2013-05-27: 25 mg via ORAL
  Filled 2013-05-27: qty 1

## 2013-05-27 MED ORDER — MEGESTROL ACETATE 40 MG PO TABS: 40.0000 mg | ORAL_TABLET | Freq: Two times a day (BID) | ORAL | Status: DC

## 2013-05-27 NOTE — Discharge Summary (Signed)
Physician Discharge Summary  Patient ID: Teresa Camacho MRN: [id number] DOB/AGE: [birth year] 47 y.o.  Admit date: 05/25/2013 Discharge date: 05/27/2013  Admission Diagnoses: menometrorrhagia, uterine fibroid, anemia  Discharge Diagnoses: same Active Problems:   Dysfunctional uterine bleeding   Discharged Condition: fair  Hospital Course: 48 y.o. [id number] with heavy DUB with submucosal fibroid despite megace Tx. Scheduled to see Dr. Corinna Capra next week.    Consults: None  Significant Diagnostic Studies: labs:  CBC    Component Value Date/Time   WBC 11.0* 05/27/2013 0515   RBC 4.06 05/27/2013 0515   HGB 9.8* 05/27/2013 0515   HCT 30.8* 05/27/2013 0515   PLT 361 05/27/2013 0515   MCV 75.9* 05/27/2013 0515   MCH 24.1* 05/27/2013 0515   MCHC 31.8 05/27/2013 0515   RDW 15.2 05/27/2013 0515   LYMPHSABS 3.3 05/25/2013 1738   MONOABS 0.6 05/25/2013 1738   EOSABS 0.3 05/25/2013 1738   BASOSABS 0.0 05/25/2013 1738      Treatments: IV hydration and Megace, Premarin and transfusion 3 units PRBC  Discharge Exam: Blood pressure 123/57, pulse 96, temperature 97.7 F (36.5 C), temperature source Oral, resp. rate 18, height 5\' 1"  (1.549 m), weight 215 lb (97.523 kg), last menstrual period 04/17/2013, SpO2 100.00%. NAD, alert not pale  Disposition:   Discharge Orders   Future Orders Complete By Expires   Discharge patient  As directed    Comments:     To home       Medication List         ferrous fumarate 325 (106 FE) MG Tabs tablet  Commonly known as:  HEMOCYTE - 106 mg FE  Take 1 tablet by mouth daily.     megestrol 20 MG tablet  Commonly known as:  MEGACE  Take 1 tablet (20 mg total) by mouth 2 (two) times daily.     multivitamin with minerals tablet  Take 1 tablet by mouth daily.     VITAMIN B-12 IJ  Inject 1 Applicatorful as directed See admin instructions. Had one shot on 05/19/13 and 05/23/13.           Follow-up Information   Follow up with LOWE,DAVID C, MD On 05/31/2013.   Specialty:  Obstetrics and Gynecology   Contact information:   [address], [place] [address] [phone]       Signed: Emeterio Reeve 05/27/2013, 7:14 AM

## 2013-05-27 NOTE — Progress Notes (Signed)
Discharge instructions provided to patient at bedside.  Follow up appointments, activity, medications, when to call the doctor and community resources discussed.  No questions at this time.  Patient left unit in stable condition with all personal belongings accompanied by staff.  Leighton Roach, RN---------------

## 2013-05-27 NOTE — Discharge Instructions (Signed)
Uterine Fibroid A uterine fibroid is a growth (tumor) that occurs in your uterus. This type of tumor is not cancerous and does not spread out of the uterus. You can have one or many fibroids. Fibroids can vary in size, weight, and where they grow in the uterus. Some can become quite large. Most fibroids do not require medical treatment, but some can cause pain or heavy bleeding during and between periods. CAUSES  A fibroid is the result of a single uterine cell that keeps growing (unregulated), which is different than most cells in the human body. Most cells have a control mechanism that keeps them from reproducing without control.  SIGNS AND SYMPTOMS   Bleeding.  Pelvic pain and pressure.  Bladder problems due to the size of the fibroid.  Infertility and miscarriages depending on the size and location of the fibroid. DIAGNOSIS  Uterine fibroids are diagnosed through a physical exam. Your health care provider may feel the lumpy tumors during a pelvic exam. Ultrasonography may be done to get information regarding size, location, and number of tumors.  TREATMENT   Your health care provider may recommend watchful waiting. This involves getting the fibroid checked by your health care provider to see if it grows or shrinks.   Hormone treatment or an intrauterine device (IUD) may be prescribed.   Surgery may be needed to remove the fibroids (myomectomy) or the uterus (hysterectomy). This depends on your situation. When fibroids interfere with fertility and a woman wants to become pregnant, a health care provider may recommend having the fibroids removed.  HOME CARE INSTRUCTIONS  Home care depends on how you were treated. In general:   Keep all follow-up appointments with your health care provider.   Only take over-the-counter or prescription medicines as directed by your health care provider. If you were prescribed a hormone treatment, take the hormone medicines exactly as directed. Do not  take aspirin. It can cause bleeding.   Talk to your health care provider about taking iron pills.  If your periods are troublesome but not so heavy, lie down with your feet raised slightly above your heart. Place cold packs on your lower abdomen.   If your periods are heavy, write down the number of pads or tampons you use per month. Bring this information to your health care provider.   Include green vegetables in your diet.  SEEK IMMEDIATE MEDICAL CARE IF:  You have pelvic pain or cramps not controlled with medicines.   You have a sudden increase in pelvic pain.   You have an increase in bleeding between and during periods.   You have excessive periods and soak tampons or pads in a half hour or less.  You feel lightheaded or have fainting episodes. Document Released: 05/01/2000 Document Revised: 02/22/2013 Document Reviewed: 12/01/2012 Exit[employer] Patient Information 2014 [employer].  

## 2013-06-04 ENCOUNTER — Encounter (HOSPITAL_COMMUNITY): Payer: Self-pay | Admitting: *Deleted

## 2013-06-04 ENCOUNTER — Inpatient Hospital Stay (HOSPITAL_COMMUNITY)
Admission: AD | Admit: 2013-06-04 | Discharge: 2013-06-04 | Disposition: A | Discharge status: Home / Self Care | Payer: No Typology Code available for payment source | Source: Ambulatory Visit | Attending: Obstetrics and Gynecology | Admitting: Obstetrics and Gynecology

## 2013-06-04 DIAGNOSIS — N949 Unspecified condition associated with female genital organs and menstrual cycle: Secondary | ICD-10-CM | POA: Insufficient documentation

## 2013-06-04 DIAGNOSIS — D250 Submucous leiomyoma of uterus: Secondary | ICD-10-CM | POA: Insufficient documentation

## 2013-06-04 DIAGNOSIS — N925 Other specified irregular menstruation: Secondary | ICD-10-CM | POA: Insufficient documentation

## 2013-06-04 DIAGNOSIS — N938 Other specified abnormal uterine and vaginal bleeding: Secondary | ICD-10-CM

## 2013-06-04 HISTORY — DX: Anemia, unspecified: D64.9

## 2013-06-04 HISTORY — DX: Encounter for other specified aftercare: Z51.89

## 2013-06-04 LAB — URINALYSIS, ROUTINE W REFLEX MICROSCOPIC
Bilirubin Urine: NEGATIVE
Glucose, UA: NEGATIVE mg/dL
Ketones, ur: 40 mg/dL — AB
Leukocytes, UA: NEGATIVE
Nitrite: POSITIVE — AB
PH: 5 (ref 5.0–8.0)
Protein, ur: 100 mg/dL — AB
Urobilinogen, UA: 1 mg/dL (ref 0.0–1.0)

## 2013-06-04 LAB — URINE MICROSCOPIC-ADD ON

## 2013-06-04 LAB — HEMOGLOBIN AND HEMATOCRIT, BLOOD
HCT: 34.2 % — ABNORMAL LOW (ref 36.0–46.0)
Hemoglobin: 10.8 g/dL — ABNORMAL LOW (ref 12.0–15.0)

## 2013-06-04 MED ORDER — ESTROGENS CONJUGATED 25 MG IJ SOLR
25.0000 mg | Freq: Once | INTRAMUSCULAR | Status: AC
  Administered 2013-06-04: 25 mg via INTRAVENOUS
  Filled 2013-06-04: qty 25

## 2013-06-04 MED ORDER — KETOROLAC TROMETHAMINE 60 MG/2ML IM SOLN
60.0000 mg | Freq: Once | INTRAMUSCULAR | Status: AC
  Administered 2013-06-04: 60 mg via INTRAMUSCULAR
  Filled 2013-06-04: qty 2

## 2013-06-04 MED ORDER — ONDANSETRON 8 MG PO TBDP
8.0000 mg | ORAL_TABLET | Freq: Once | ORAL | Status: AC
  Administered 2013-06-04: 8 mg via ORAL
  Filled 2013-06-04: qty 1

## 2013-06-04 MED ORDER — LACTATED RINGERS IV BOLUS (SEPSIS)
1000.0000 mL | Freq: Once | INTRAVENOUS | Status: AC
  Administered 2013-06-04: 1000 mL via INTRAVENOUS

## 2013-06-04 MED ORDER — ONDANSETRON 8 MG PO TBDP: 8.0000 mg | ORAL_TABLET | Freq: Three times a day (TID) | ORAL | Status: DC | PRN

## 2013-06-04 NOTE — MAU Provider Note (Signed)
History     CSN: [id number]  Arrival date and time: 06/04/13 1338   First Provider Initiated Contact with Patient 06/04/13 1520      Chief Complaint  Patient presents with  . Vaginal Bleeding   HPI Comments: Teresa Camacho 48 y.o. [id number] presents to MAU for ongoing vaginal bleeding. She has been bleeding abnormally for the last 6 months and in the last 3 months it has become so bad she required a 3 unit transfusion. Her H/H on Jan 10th was 6.6 and today it is 10.8. She has been given estrogen and megace for bleeding that helps somewhat. She has an appointment with Dr Corinna Capra for further studies next week. She has had heavy bleeding changing a pad every 1.5 hours and passing large clots. She was diagnosed with 2.7 submucosal fibroid.    Vaginal Bleeding Associated symptoms include nausea. Pertinent negatives include no headaches.      Past Medical History  Diagnosis Date  . PCOS (polycystic ovarian syndrome)   . Sleep apnea   . Blood transfusion without reported diagnosis   . Anemia     Past Surgical History  Procedure Laterality Date  . Appendectomy    . Cholecystectomy    . Cesarean section      3x  . Cholecystectomy  1991    Family History  Problem Relation Age of Onset  . Cancer Mother   . Depression Mother   . Diabetes Mother   . Arthritis Mother   . Heart disease Mother   . Learning disabilities Mother   . Mental illness Mother   . Cancer Father   . Arthritis Father   . Cancer Maternal Aunt     x 2 Aunts    History  Substance Use Topics  . Smoking status: Never Smoker   . Smokeless tobacco: Not on file  . Alcohol Use: No    Allergies:  Allergies  Allergen Reactions  . Codeine Nausea And Vomiting    Prescriptions prior to admission  Medication Sig Dispense Refill  . ciprofloxacin (CIPRO) 500 MG tablet Take 500 mg by mouth 2 (two) times daily.      . Cyanocobalamin (VITAMIN B-12 IJ) Inject 1 Applicatorful as directed See admin instructions. Had  one shot on 05/19/13 and 05/23/13.      Teresa Camacho Calcium (STOOL SOFTENER PO) Take 1 capsule by mouth daily.      . ferrous fumarate (HEMOCYTE - 106 MG FE) 325 (106 FE) MG TABS tablet Take 1 tablet by mouth daily.      . megestrol (MEGACE) 20 MG tablet Take 1 tablet (20 mg total) by mouth 2 (two) times daily.  60 tablet  1  . MELATONIN PO Take 1 tablet by mouth as needed (sleep).      . Multiple Vitamins-Minerals (MULTIVITAMIN WITH MINERALS) tablet Take 1 tablet by mouth daily.      . ondansetron (ZOFRAN) 4 MG tablet Take 4 mg by mouth every 8 (eight) hours as needed for nausea or vomiting.        Review of Systems  Eyes: Negative.   Respiratory: Negative.   Cardiovascular: Negative.   Gastrointestinal: Positive for nausea.  Genitourinary: Negative.        Heavy vaginal bleeding  Musculoskeletal: Negative.   Skin: Negative.   Neurological: Positive for dizziness and weakness. Negative for headaches.  Psychiatric/Behavioral: Negative.    Physical Exam   Blood pressure 140/87, pulse 93, temperature 98.7 F (37.1 C), temperature source Oral, resp.  rate 16, last menstrual period 03/13/2013.  Physical Exam  Constitutional: She is oriented to person, place, and time. She appears well-developed and well-nourished.  PCOS appearance   HENT:  Head: Normocephalic and atraumatic.  Cardiovascular: Normal rate, regular rhythm and normal heart sounds.   Respiratory: Effort normal and breath sounds normal. No respiratory distress. She has no wheezes. She has no rales. She exhibits no tenderness.  GI: Soft. Bowel sounds are normal. She exhibits no distension. There is no tenderness.  Genitourinary:  Genital: External : negative Vaginal: one large clot in vault Cervix: 1cm Bimanual: slight tender with exam   Musculoskeletal: Normal range of motion.  Neurological: She is alert and oriented to person, place, and time.  Skin: Skin is warm and dry.  Psychiatric: She has a normal mood and affect.  Her behavior is normal. Judgment and thought content normal.    MAU Course  Procedures  MDM  H/H, Toradol 60 mg IM, Zofran 8 mg ODT Spoke with Dr Radene Knee who advised 25 mg Premarin with IVF ( LR) Pt at discharge is feeling much better, pain gone, nausea gone, bleeding feels like its slowing down  Assessment and Plan   A: DUB  P: Premarin 25 mg IV push slowly IV LR Follow up with Dr Corinna Capra Zofran 8 mg BID PRN nausea  Teresa Camacho 06/04/2013, 3:48 PM

## 2013-06-04 NOTE — Discharge Instructions (Signed)

## 2013-06-04 NOTE — MAU Note (Signed)
Pt presents with complaints of vaginal bleeding with clots that started on Friday. Pt received 3 units of blood on January 10th and was sent home to follow up on Wednesday and has an ultrasound scheduled for next week.

## 2013-06-08 ENCOUNTER — Encounter (HOSPITAL_COMMUNITY): Payer: Self-pay | Admitting: Pharmacist

## 2013-06-11 NOTE — H&P (Addendum)
Teresa Camacho presents today for follow-up evaluation of abnormal bleeding, menorrhagia and anemia.  Since I last saw her last week, she presented to the hospital with heavy bleeding and was given IV Premarin which did help stop the bleeding.  Blood count had dropped slightly.  She continues to have bleeding at this time.  It is not as heavy as it was previously.  Teresa Camacho is continuing to have a lot of problems from bleeding, has been in and out of the hospital twice now, having been recently transfused and requiring IV Premarin to stop the bleeding.  After her hemoglobin was down to 6, she got 3 units of blood.  Hemoglobin last week was up to 11.1.  In the hospital, it had dropped to 8.8 and required IV Premarin just three days ago.  Currently her bleeding has slowed slightly but she is continuing to bleed and her hemoglobin stat CBC was done today and hemoglobin is up to 9.2.  Discussed options with her at length.  Obviously she needs surgical evaluation and treatment of these fibroids.   O:  BP 110/72 Wt 211 CV RRR Lungs CTAb Abd Obese, nt, Appx and pfannesteil incisions well healed Uterus AV mobile 8 weeks size She underwent saline infusion ultrasound today which is carried out without difficulty.  She did have some clots noted in the cervix that were easily removed.  Saline infusion ultrasound shows a large intracavitary fibroid measuring 5.3 cm in size.  Normal-appearing ovaries.  She has other small 2-3 cm fibroids within the endometrial wall as well.    A&P: Abnormal bleeding, intracavitary fibroids and other fibroids, anemia.  I had a lengthy discussion with Teresa Camacho and her friend who is with her today.  Discussed the possibility of hysteroscopic resection of the intracavitary fibroid, even potential endometrial ablation versus hysterectomy.  Due to the fact that she has no further childbearing desires and she also has several fibroids, she desires hysterectomy.  We reviewed her previous labs which  showed that she is not menopausal, so at this time she is more inclined to keep her ovaries.  Discussed laparoscopically assisted vaginal hysterectomy versus abdominal hysterectomy.  In the past, she has had a ruptured appendix.  She also had gallbladder problems which required reoperation.  She has also had several Cesarean sections, so most of these would preclude an attempt at laparoscopically assisted vaginal hysterectomy, so she wants to proceed with total abdominal hysterectomy.  Discussed different options with Teresa Camacho as far as timing of this.  Because she continues to bleed on a regular basis and has been to the hospital twice in the last week, I think this needs to be more urgently.  We are going to try to schedule this in the next few days.  Another consideration certainly if she begins frank bleeding, is to simply admit her and possibly transfuse here and proceed on a more urgent or emergent basis.  Discussed the pros and cons, the risks and benefits of hysterectomy including but not limited to risk of infection, bleeding, damage to bowel, bladder, ureters, ovaries, risk of blood transfusion.  She does have a history of complications from most of her surgeries but did do well with her Cesarean section.  She does want to proceed as soon as possible.  I also went ahead and placed her on estradiol 2 mg daily in the meantime to see if we can control the bleeding for the next several days, since she did respond well to Premarin.  In the meantime,  if she does have heavy or uncontrolled bleeding, she is to present immediately to the emergency room.  Continue iron in the meantime and we will    06/13/13 0710 This patient has been seen and examined.   All of her questions were answered.  Labs and vital signs reviewed.  Informed consent has been obtained.  The History and Physical is current. Pt additionally wants two vaginal skin tags evaluated and removed.  Consent updated.  Plan to transfuse 2 U pRBCs due  to hgb 7.7 and fatigue and weakness.  Pt agrees DL

## 2013-06-12 ENCOUNTER — Encounter (HOSPITAL_COMMUNITY)
Admission: RE | Admit: 2013-06-12 | Discharge: 2013-06-12 | Disposition: A | Discharge status: Home / Self Care | Payer: No Typology Code available for payment source | Source: Ambulatory Visit | Attending: Obstetrics and Gynecology | Admitting: Obstetrics and Gynecology

## 2013-06-12 ENCOUNTER — Encounter (HOSPITAL_COMMUNITY): Payer: Self-pay

## 2013-06-12 HISTORY — DX: Nausea with vomiting, unspecified: R11.2

## 2013-06-12 HISTORY — DX: Other specified postprocedural states: Z98.890

## 2013-06-12 LAB — CBC
HEMATOCRIT: 24.9 % — AB (ref 36.0–46.0)
HEMOGLOBIN: 7.7 g/dL — AB (ref 12.0–15.0)
MCH: 23.3 pg — ABNORMAL LOW (ref 26.0–34.0)
MCHC: 30.9 g/dL (ref 30.0–36.0)
MCV: 75.5 fL — ABNORMAL LOW (ref 78.0–100.0)
Platelets: 483 10*3/uL — ABNORMAL HIGH (ref 150–400)
RBC: 3.3 MIL/uL — ABNORMAL LOW (ref 3.87–5.11)
RDW: 15.9 % — AB (ref 11.5–15.5)
WBC: 11.9 10*3/uL — ABNORMAL HIGH (ref 4.0–10.5)

## 2013-06-12 MED ORDER — DEXTROSE 5 % IV SOLN
2.0000 g | INTRAVENOUS | Status: AC
  Administered 2013-06-13: 2 g via INTRAVENOUS
  Filled 2013-06-12: qty 2

## 2013-06-12 NOTE — Patient Instructions (Signed)
20 Teresa Camacho  06/12/2013   Your procedure is scheduled on:  06/13/13  Enter through the Main Entrance of [facility] at [place] up the phone at the desk and dial [phone].   Call this number if you have problems the morning of surgery: [phone]   Remember:   Do not eat food:After Midnight.  Do not drink clear liquids: After Midnight.  Take these medicines the morning of surgery with A SIP OF WATER: NA   Do not wear jewelry, make-up or nail polish.  Do not wear lotions, powders, or perfumes. You may wear deodorant.  Do not shave 48 hours prior to surgery.  Do not bring valuables to the hospital.  [facility] is not   responsible for any belongings or valuables brought to the hospital.  Contacts, dentures or bridgework may not be worn into surgery.  Leave suitcase in the car. After surgery it may be brought to your room.  For patients admitted to the hospital, checkout time is 11:00 AM the day of              discharge.   Patients discharged the day of surgery will not be allowed to drive             home.  Name and phone number of your driver: NA  Special Instructions:   Shower using CHG 2 nights before surgery and the night before surgery.  If you shower the day of surgery use CHG.  Use special wash - you have one bottle of CHG for all showers.  You should use approximately 1/3 of the bottle for each shower.   Please read over the following fact sheets that you were given:   Surgical Site Infection Prevention

## 2013-06-13 ENCOUNTER — Inpatient Hospital Stay (HOSPITAL_COMMUNITY)
Admission: RE | Admit: 2013-06-13 | Discharge: 2013-06-18 | DRG: 743 | Disposition: A | Discharge status: Home / Self Care | Payer: No Typology Code available for payment source | Source: Ambulatory Visit | Attending: Obstetrics and Gynecology | Admitting: Obstetrics and Gynecology

## 2013-06-13 ENCOUNTER — Inpatient Hospital Stay (HOSPITAL_COMMUNITY): Payer: No Typology Code available for payment source | Admitting: Certified Registered Nurse Anesthetist

## 2013-06-13 ENCOUNTER — Encounter (HOSPITAL_COMMUNITY): Payer: No Typology Code available for payment source | Admitting: Certified Registered Nurse Anesthetist

## 2013-06-13 ENCOUNTER — Encounter (HOSPITAL_COMMUNITY): Payer: Self-pay | Admitting: *Deleted

## 2013-06-13 ENCOUNTER — Encounter (HOSPITAL_COMMUNITY)
Admission: RE | Disposition: A | Discharge status: Home / Self Care | Payer: Self-pay | Source: Ambulatory Visit | Attending: Obstetrics and Gynecology

## 2013-06-13 DIAGNOSIS — Z90710 Acquired absence of both cervix and uterus: Secondary | ICD-10-CM | POA: Diagnosis present

## 2013-06-13 DIAGNOSIS — R110 Nausea: Secondary | ICD-10-CM | POA: Diagnosis not present

## 2013-06-13 DIAGNOSIS — D251 Intramural leiomyoma of uterus: Secondary | ICD-10-CM | POA: Diagnosis present

## 2013-06-13 DIAGNOSIS — N72 Inflammatory disease of cervix uteri: Secondary | ICD-10-CM | POA: Diagnosis present

## 2013-06-13 DIAGNOSIS — N8000 Endometriosis of the uterus, unspecified: Secondary | ICD-10-CM | POA: Diagnosis present

## 2013-06-13 DIAGNOSIS — N949 Unspecified condition associated with female genital organs and menstrual cycle: Secondary | ICD-10-CM | POA: Diagnosis present

## 2013-06-13 DIAGNOSIS — N920 Excessive and frequent menstruation with regular cycle: Principal | ICD-10-CM | POA: Diagnosis present

## 2013-06-13 DIAGNOSIS — N842 Polyp of vagina: Secondary | ICD-10-CM | POA: Diagnosis present

## 2013-06-13 DIAGNOSIS — D252 Subserosal leiomyoma of uterus: Secondary | ICD-10-CM | POA: Diagnosis present

## 2013-06-13 DIAGNOSIS — G8918 Other acute postprocedural pain: Secondary | ICD-10-CM | POA: Diagnosis present

## 2013-06-13 DIAGNOSIS — N938 Other specified abnormal uterine and vaginal bleeding: Secondary | ICD-10-CM | POA: Diagnosis present

## 2013-06-13 DIAGNOSIS — D649 Anemia, unspecified: Secondary | ICD-10-CM | POA: Diagnosis present

## 2013-06-13 DIAGNOSIS — R252 Cramp and spasm: Secondary | ICD-10-CM | POA: Diagnosis not present

## 2013-06-13 DIAGNOSIS — N925 Other specified irregular menstruation: Secondary | ICD-10-CM | POA: Diagnosis present

## 2013-06-13 DIAGNOSIS — N289 Disorder of kidney and ureter, unspecified: Secondary | ICD-10-CM | POA: Diagnosis present

## 2013-06-13 DIAGNOSIS — N840 Polyp of corpus uteri: Secondary | ICD-10-CM | POA: Diagnosis present

## 2013-06-13 HISTORY — PX: ABDOMINAL HYSTERECTOMY: SHX81

## 2013-06-13 LAB — PREPARE RBC (CROSSMATCH)

## 2013-06-13 SURGERY — HYSTERECTOMY, ABDOMINAL
Anesthesia: General | Site: Abdomen

## 2013-06-13 MED ORDER — NALOXONE HCL 0.4 MG/ML IJ SOLN: 0.4000 mg | INTRAMUSCULAR | Status: DC | PRN

## 2013-06-13 MED ORDER — HYDROMORPHONE HCL PF 1 MG/ML IJ SOLN
0.2000 mg | INTRAMUSCULAR | Status: DC | PRN
  Administered 2013-06-14: 0.6 mg via INTRAVENOUS
  Filled 2013-06-13: qty 1

## 2013-06-13 MED ORDER — MEPERIDINE HCL 25 MG/ML IJ SOLN
INTRAMUSCULAR | Status: AC
  Filled 2013-06-13: qty 1

## 2013-06-13 MED ORDER — DEXTROSE 5 % IV SOLN
2.0000 g | Freq: Two times a day (BID) | INTRAVENOUS | Status: AC
  Administered 2013-06-13 – 2013-06-14 (×2): 2 g via INTRAVENOUS
  Filled 2013-06-13 (×2): qty 2

## 2013-06-13 MED ORDER — MIDAZOLAM HCL 2 MG/2ML IJ SOLN
INTRAMUSCULAR | Status: DC | PRN
  Administered 2013-06-13: 1 mg via INTRAVENOUS

## 2013-06-13 MED ORDER — SCOPOLAMINE 1 MG/3DAYS TD PT72
MEDICATED_PATCH | TRANSDERMAL | Status: AC
  Filled 2013-06-13: qty 1

## 2013-06-13 MED ORDER — GLYCOPYRROLATE 0.2 MG/ML IJ SOLN
INTRAMUSCULAR | Status: AC
  Filled 2013-06-13: qty 2

## 2013-06-13 MED ORDER — LIDOCAINE HCL (CARDIAC) 20 MG/ML IV SOLN
INTRAVENOUS | Status: DC | PRN
  Administered 2013-06-13: 100 mg via INTRAVENOUS

## 2013-06-13 MED ORDER — ONDANSETRON HCL 4 MG/2ML IJ SOLN
INTRAMUSCULAR | Status: AC
  Filled 2013-06-13: qty 2

## 2013-06-13 MED ORDER — FENTANYL CITRATE 0.05 MG/ML IJ SOLN
INTRAMUSCULAR | Status: AC
  Administered 2013-06-13: 50 ug via INTRAVENOUS
  Filled 2013-06-13: qty 2

## 2013-06-13 MED ORDER — HYDROMORPHONE 0.3 MG/ML IV SOLN
INTRAVENOUS | Status: DC
  Administered 2013-06-13: 6.17 mg via INTRAVENOUS
  Administered 2013-06-13: 18:00:00 via INTRAVENOUS
  Administered 2013-06-14: 0.799 mg via INTRAVENOUS
  Administered 2013-06-14: 2.59 mg via INTRAVENOUS
  Administered 2013-06-14: 2.39 mg via INTRAVENOUS
  Administered 2013-06-14: 07:00:00 via INTRAVENOUS
  Filled 2013-06-13 (×2): qty 25

## 2013-06-13 MED ORDER — FENTANYL CITRATE 0.05 MG/ML IJ SOLN
25.0000 ug | INTRAMUSCULAR | Status: DC | PRN
  Administered 2013-06-13 (×2): 50 ug via INTRAVENOUS

## 2013-06-13 MED ORDER — MEPERIDINE HCL 25 MG/ML IJ SOLN
6.2500 mg | INTRAMUSCULAR | Status: DC | PRN
  Administered 2013-06-13: 12.5 mg via INTRAVENOUS

## 2013-06-13 MED ORDER — LACTATED RINGERS IV SOLN
INTRAVENOUS | Status: DC | PRN
  Administered 2013-06-13: 08:00:00 via INTRAVENOUS

## 2013-06-13 MED ORDER — SODIUM CHLORIDE 0.9 % IV SOLN
INTRAVENOUS | Status: DC | PRN
  Administered 2013-06-13: 08:00:00 via INTRAVENOUS

## 2013-06-13 MED ORDER — KETOROLAC TROMETHAMINE 30 MG/ML IJ SOLN: 15.0000 mg | Freq: Once | INTRAMUSCULAR | Status: DC | PRN

## 2013-06-13 MED ORDER — SUFENTANIL CITRATE 50 MCG/ML IV SOLN
INTRAVENOUS | Status: AC
  Filled 2013-06-13: qty 1

## 2013-06-13 MED ORDER — ONDANSETRON HCL 4 MG/2ML IJ SOLN
INTRAMUSCULAR | Status: DC | PRN
  Administered 2013-06-13 (×2): 2 mg via INTRAVENOUS

## 2013-06-13 MED ORDER — ZOLPIDEM TARTRATE 5 MG PO TABS
5.0000 mg | ORAL_TABLET | Freq: Every evening | ORAL | Status: DC | PRN
  Administered 2013-06-15 – 2013-06-18 (×3): 5 mg via ORAL
  Filled 2013-06-13 (×3): qty 1

## 2013-06-13 MED ORDER — DEXAMETHASONE SODIUM PHOSPHATE 10 MG/ML IJ SOLN
INTRAMUSCULAR | Status: AC
  Filled 2013-06-13: qty 1

## 2013-06-13 MED ORDER — LACTATED RINGERS IV SOLN
INTRAVENOUS | Status: DC
  Administered 2013-06-13 (×2): via INTRAVENOUS

## 2013-06-13 MED ORDER — PROMETHAZINE HCL 25 MG/ML IJ SOLN
INTRAMUSCULAR | Status: AC
  Filled 2013-06-13: qty 1

## 2013-06-13 MED ORDER — SCOPOLAMINE 1 MG/3DAYS TD PT72
1.0000 | MEDICATED_PATCH | TRANSDERMAL | Status: DC
  Administered 2013-06-13: 1.5 mg via TRANSDERMAL

## 2013-06-13 MED ORDER — NEOSTIGMINE METHYLSULFATE 1 MG/ML IJ SOLN
INTRAMUSCULAR | Status: DC | PRN
  Administered 2013-06-13: 5 mg via INTRAVENOUS

## 2013-06-13 MED ORDER — GLYCOPYRROLATE 0.2 MG/ML IJ SOLN
INTRAMUSCULAR | Status: DC | PRN
  Administered 2013-06-13: 0.1 mg via INTRAVENOUS
  Administered 2013-06-13: 0.6 mg via INTRAVENOUS

## 2013-06-13 MED ORDER — PROPOFOL 10 MG/ML IV EMUL
INTRAVENOUS | Status: AC
  Filled 2013-06-13: qty 20

## 2013-06-13 MED ORDER — DIPHENHYDRAMINE HCL 12.5 MG/5ML PO ELIX
12.5000 mg | ORAL_SOLUTION | Freq: Four times a day (QID) | ORAL | Status: DC | PRN
  Filled 2013-06-13: qty 5

## 2013-06-13 MED ORDER — DIPHENHYDRAMINE HCL 12.5 MG/5ML PO ELIX: 12.5000 mg | ORAL_SOLUTION | Freq: Four times a day (QID) | ORAL | Status: DC | PRN

## 2013-06-13 MED ORDER — HYDROMORPHONE 0.3 MG/ML IV SOLN
INTRAVENOUS | Status: DC
  Administered 2013-06-13: 2.19 mg via INTRAVENOUS
  Administered 2013-06-13: 13:00:00 via INTRAVENOUS
  Filled 2013-06-13: qty 25

## 2013-06-13 MED ORDER — DEXTROSE-NACL 5-0.45 % IV SOLN
INTRAVENOUS | Status: DC
  Administered 2013-06-13 – 2013-06-14 (×3): via INTRAVENOUS

## 2013-06-13 MED ORDER — ONDANSETRON HCL 4 MG/2ML IJ SOLN
4.0000 mg | Freq: Four times a day (QID) | INTRAMUSCULAR | Status: DC | PRN
  Administered 2013-06-14: 4 mg via INTRAVENOUS
  Filled 2013-06-13: qty 2

## 2013-06-13 MED ORDER — PROMETHAZINE HCL 25 MG/ML IJ SOLN
6.2500 mg | INTRAMUSCULAR | Status: DC | PRN
Start: 2013-06-13 — End: 2013-06-13
  Administered 2013-06-13: 6.25 mg via INTRAVENOUS

## 2013-06-13 MED ORDER — GLYCOPYRROLATE 0.2 MG/ML IJ SOLN
INTRAMUSCULAR | Status: AC
  Filled 2013-06-13: qty 3

## 2013-06-13 MED ORDER — DEXAMETHASONE SODIUM PHOSPHATE 10 MG/ML IJ SOLN
INTRAMUSCULAR | Status: DC | PRN
  Administered 2013-06-13: 10 mg via INTRAVENOUS

## 2013-06-13 MED ORDER — SODIUM CHLORIDE 0.9 % IJ SOLN: 9.0000 mL | INTRAMUSCULAR | Status: DC | PRN

## 2013-06-13 MED ORDER — ONDANSETRON HCL 4 MG/2ML IJ SOLN
4.0000 mg | Freq: Four times a day (QID) | INTRAMUSCULAR | Status: DC | PRN
  Administered 2013-06-13: 4 mg via INTRAVENOUS
  Filled 2013-06-13: qty 2

## 2013-06-13 MED ORDER — OXYCODONE-ACETAMINOPHEN 5-325 MG PO TABS
1.0000 | ORAL_TABLET | ORAL | Status: DC | PRN
  Administered 2013-06-14 – 2013-06-16 (×7): 2 via ORAL
  Filled 2013-06-13 (×7): qty 2

## 2013-06-13 MED ORDER — MENTHOL 3 MG MT LOZG: 1.0000 | LOZENGE | OROMUCOSAL | Status: DC | PRN

## 2013-06-13 MED ORDER — NEOSTIGMINE METHYLSULFATE 1 MG/ML IJ SOLN
INTRAMUSCULAR | Status: AC
  Filled 2013-06-13: qty 1

## 2013-06-13 MED ORDER — MIDAZOLAM HCL 2 MG/2ML IJ SOLN
INTRAMUSCULAR | Status: AC
  Filled 2013-06-13: qty 2

## 2013-06-13 MED ORDER — PROPOFOL 10 MG/ML IV BOLUS
INTRAVENOUS | Status: DC | PRN
  Administered 2013-06-13: 200 mg via INTRAVENOUS
  Administered 2013-06-13: 100 mg via INTRAVENOUS

## 2013-06-13 MED ORDER — FENTANYL CITRATE 0.05 MG/ML IJ SOLN
INTRAMUSCULAR | Status: AC
  Filled 2013-06-13: qty 2

## 2013-06-13 MED ORDER — ROCURONIUM BROMIDE 100 MG/10ML IV SOLN
INTRAVENOUS | Status: DC | PRN
  Administered 2013-06-13: 60 mg via INTRAVENOUS

## 2013-06-13 MED ORDER — SUFENTANIL CITRATE 50 MCG/ML IV SOLN
INTRAVENOUS | Status: DC | PRN
  Administered 2013-06-13: 10 ug via INTRAVENOUS
  Administered 2013-06-13 (×2): 5 ug via INTRAVENOUS
  Administered 2013-06-13 (×3): 10 ug via INTRAVENOUS

## 2013-06-13 MED ORDER — LIDOCAINE HCL (CARDIAC) 20 MG/ML IV SOLN
INTRAVENOUS | Status: AC
  Filled 2013-06-13: qty 5

## 2013-06-13 MED ORDER — DIPHENHYDRAMINE HCL 50 MG/ML IJ SOLN
12.5000 mg | Freq: Four times a day (QID) | INTRAMUSCULAR | Status: DC | PRN
  Administered 2013-06-13 – 2013-06-14 (×4): 12.5 mg via INTRAVENOUS
  Filled 2013-06-13 (×4): qty 1

## 2013-06-13 MED ORDER — KETOROLAC TROMETHAMINE 30 MG/ML IJ SOLN
INTRAMUSCULAR | Status: AC
  Filled 2013-06-13: qty 1

## 2013-06-13 MED ORDER — IBUPROFEN 600 MG PO TABS
600.0000 mg | ORAL_TABLET | Freq: Four times a day (QID) | ORAL | Status: DC | PRN
  Administered 2013-06-14 – 2013-06-16 (×6): 600 mg via ORAL
  Filled 2013-06-13 (×6): qty 1

## 2013-06-13 MED ORDER — HYDROMORPHONE HCL PF 1 MG/ML IJ SOLN
0.5000 mg | Freq: Once | INTRAMUSCULAR | Status: AC
  Administered 2013-06-13: 0.5 mg via INTRAVENOUS

## 2013-06-13 MED ORDER — DIPHENHYDRAMINE HCL 50 MG/ML IJ SOLN: 12.5000 mg | Freq: Four times a day (QID) | INTRAMUSCULAR | Status: DC | PRN

## 2013-06-13 MED ORDER — HYDROMORPHONE HCL PF 1 MG/ML IJ SOLN
INTRAMUSCULAR | Status: AC
  Administered 2013-06-13: 0.5 mg via INTRAVENOUS
  Filled 2013-06-13: qty 1

## 2013-06-13 SURGICAL SUPPLY — 41 items
CANISTER SUCT 3000ML (MISCELLANEOUS) ×2 IMPLANT
CELLS DAT CNTRL 66122 CELL SVR (MISCELLANEOUS) IMPLANT
CLOTH BEACON ORANGE TIMEOUT ST (SAFETY) ×2 IMPLANT
DECANTER SPIKE VIAL GLASS SM (MISCELLANEOUS) IMPLANT
DRAPE WARM FLUID 44X44 (DRAPE) ×2 IMPLANT
DRSG OPSITE POSTOP 4X10 (GAUZE/BANDAGES/DRESSINGS) ×2 IMPLANT
ELECT LIGASURE LONG (ELECTRODE) ×2 IMPLANT
GLOVE BIO SURGEON STRL SZ8 (GLOVE) ×4 IMPLANT
GLOVE SURG ORTHO 8.0 STRL STRW (GLOVE) ×4 IMPLANT
GOWN PREVENTION PLUS LG XLONG (DISPOSABLE) IMPLANT
GOWN STRL REUS W/ TWL XL LVL3 (GOWN DISPOSABLE) ×2 IMPLANT
GOWN STRL REUS W/TWL LRG LVL3 (GOWN DISPOSABLE) ×4 IMPLANT
GOWN STRL REUS W/TWL XL LVL3 (GOWN DISPOSABLE) ×2
HEMOSTAT SURGICEL 4X8 (HEMOSTASIS) ×2 IMPLANT
NEEDLE HYPO 25X1 1.5 SAFETY (NEEDLE) ×2 IMPLANT
NS IRRIG 1000ML POUR BTL (IV SOLUTION) ×2 IMPLANT
PACK ABDOMINAL GYN (CUSTOM PROCEDURE TRAY) ×2 IMPLANT
PAD OB MATERNITY 4.3X12.25 (PERSONAL CARE ITEMS) ×2 IMPLANT
PROTECTOR NERVE ULNAR (MISCELLANEOUS) ×2 IMPLANT
RETRACTOR WND ALEXIS 25 LRG (MISCELLANEOUS) ×1 IMPLANT
RTRCTR WOUND ALEXIS 18CM MED (MISCELLANEOUS)
RTRCTR WOUND ALEXIS 25CM LRG (MISCELLANEOUS) ×2
SPONGE LAP 18X18 X RAY DECT (DISPOSABLE) ×6 IMPLANT
STAPLER VISISTAT 35W (STAPLE) ×2 IMPLANT
STRIP CLOSURE SKIN 1/4X4 (GAUZE/BANDAGES/DRESSINGS) IMPLANT
SUT CHROMIC 3 0 SH 27 (SUTURE) IMPLANT
SUT MNCRL 0 MO-4 VIOLET 18 CR (SUTURE) ×1 IMPLANT
SUT MNCRL 0 VIOLET 6X18 (SUTURE) ×1 IMPLANT
SUT MONOCRYL 0 6X18 (SUTURE) ×1
SUT MONOCRYL 0 MO 4 18  CR/8 (SUTURE) ×1
SUT PDS AB 0 CT1 27 (SUTURE) IMPLANT
SUT PDS AB 0 CTX 60 (SUTURE) ×2 IMPLANT
SUT VIC AB 0 CT1 18XCR BRD8 (SUTURE) IMPLANT
SUT VIC AB 0 CT1 8-18 (SUTURE)
SUT VIC AB 1 CTX 36 (SUTURE)
SUT VIC AB 1 CTX36XBRD ANBCTRL (SUTURE) IMPLANT
SUT VIC AB 2-0 CT1 (SUTURE) ×2 IMPLANT
SYR CONTROL 10ML LL (SYRINGE) IMPLANT
TOWEL OR 17X24 6PK STRL BLUE (TOWEL DISPOSABLE) ×4 IMPLANT
TRAY FOLEY CATH 14FR (SET/KITS/TRAYS/PACK) ×2 IMPLANT
WATER STERILE IRR 1000ML POUR (IV SOLUTION) IMPLANT

## 2013-06-13 NOTE — Progress Notes (Signed)
Called back to pt room in order to instruct family on how to place nasal mask back on patient when she gets up. RT instructed pt and family on how to use the straps on the mask and get the maximum benefits of the CPAP. Pt has on end title monitor nasal cannula at 2L of oxygen running. Pt was comfortable when RT left the room and family was confident in operating CPAP and mask. RT will monitor.

## 2013-06-13 NOTE — Brief Op Note (Signed)
06/13/2013  8:53 AM  PATIENT:  Teresa Camacho  48 y.o. female  PRE-OPERATIVE DIAGNOSIS:  anemia, AUB, menorrhagia,fibroids  POST-OPERATIVE DIAGNOSIS:  anemia, AUB, menorrhagia,fibroids  PROCEDURE:  Procedure(s): HYSTERECTOMY ABDOMINAL and removal of vaginal skin tag (N/A)  SURGEON:  Surgeon(s) and Role:    * Luz Lex, MD - Primary    * W Evette Cristal, MD - Assisting  PHYSICIAN ASSISTANT:   ASSISTANTS: neal   ANESTHESIA:   general  EBL:  Total I/O In: 8242 [I.V.:1000; Blood:328] Out: 350 [Urine:50; Blood:300]  BLOOD ADMINISTERED:2 units CC PRBC  DRAINS: Urinary Catheter (Foley)   LOCAL MEDICATIONS USED:  NONE  SPECIMEN:  Source of Specimen:  uterus  DISPOSITION OF SPECIMEN:  PATHOLOGY  COUNTS:  YES  TOURNIQUET:  * No tourniquets in log *  DICTATION: .Other Dictation: Dictation Number 1  PLAN OF CARE: Admit to inpatient   PATIENT DISPOSITION:  PACU - hemodynamically stable.   Delay start of Pharmacological VTE agent (>24hrs) due to surgical blood loss or risk of bleeding: no

## 2013-06-13 NOTE — Transfer of Care (Signed)
Immediate Anesthesia Transfer of Care Note  Patient: Teresa Camacho  Procedure(s) Performed: Procedure(s): HYSTERECTOMY ABDOMINAL and removal of vaginal skin tag (N/A)  Patient Location: PACU  Anesthesia Type:General  Level of Consciousness: awake, alert  and oriented  Airway & Oxygen Therapy: Patient Spontanous Breathing, Patient connected to nasal cannula oxygen and Patient connected to face mask oxygen  Post-op Assessment: Report given to PACU RN, Post -op Vital signs reviewed and stable and Patient moving all extremities X 4  Post vital signs: Reviewed and stable  Complications: No apparent anesthesia complications

## 2013-06-13 NOTE — Preoperative (Signed)
Beta Blockers   Reason not to administer Beta Blockers:Not Applicable 

## 2013-06-13 NOTE — Progress Notes (Signed)
Per MD order RT placed pt on nasal CPAP of +8. Pt wears CPAP at home with home machine but did not know her settings. RT tried different settings until pt was comfortable with the +8 CPAP. Pt is ready to rest and RT instructed family and pt on how to cut machine on and off if needed and gave family instructions to call RT if there are any problems. RT will monitor.

## 2013-06-13 NOTE — Anesthesia Preprocedure Evaluation (Signed)
Anesthesia Evaluation  Patient identified by MRN, date of birth, ID band Patient awake    Reviewed: Allergy & Precautions, H&P , NPO status , Patient's Chart, lab work & pertinent test results  Airway Mallampati: I TM Distance: >3 FB Neck ROM: full    Dental no notable dental hx. (+) Teeth Intact   Pulmonary  breath sounds clear to auscultation  Pulmonary exam normal       Cardiovascular negative cardio ROS      Neuro/Psych negative neurological ROS  negative psych ROS   GI/Hepatic negative GI ROS, Neg liver ROS,   Endo/Other    Renal/GU negative Renal ROS     Musculoskeletal   Abdominal Normal abdominal exam  (+)   Peds  Hematology   Anesthesia Other Findings   Reproductive/Obstetrics negative OB ROS                           Anesthesia Physical Anesthesia Plan  ASA: II  Anesthesia Plan: General   Post-op Pain Management:    Induction: Intravenous  Airway Management Planned: Oral ETT  Additional Equipment:   Intra-op Plan:   Post-operative Plan: Extubation in OR  Informed Consent: I have reviewed the patients History and Physical, chart, labs and discussed the procedure including the risks, benefits and alternatives for the proposed anesthesia with the patient or authorized representative who has indicated his/her understanding and acceptance.   Dental Advisory Given  Plan Discussed with: CRNA, Anesthesiologist and Surgeon  Anesthesia Plan Comments: (Scop disc for PONV)        Anesthesia Quick Evaluation

## 2013-06-13 NOTE — Anesthesia Postprocedure Evaluation (Signed)
  Anesthesia Post Note  Patient: Teresa Camacho  Procedure(s) Performed: Procedure(s) (LRB): HYSTERECTOMY ABDOMINAL and removal of vaginal skin tag (N/A)  Anesthesia type: GA  Patient location: PACU  Post pain: Pain level controlled  Post assessment: Post-op Vital signs reviewed  Last Vitals:  Filed Vitals:   06/13/13 1006  BP:   Pulse: 98  Temp:   Resp: 19    Post vital signs: Reviewed  Level of consciousness: sedated  Complications: No apparent anesthesia complications

## 2013-06-13 NOTE — Anesthesia Postprocedure Evaluation (Signed)
  Anesthesia Post-op Note  Patient: Teresa Camacho  Procedure(s) Performed: Procedure(s): HYSTERECTOMY ABDOMINAL and removal of vaginal skin tag (N/A)  Patient Location: PACU and Women's Unit  Anesthesia Type:General  Level of Consciousness: awake, alert  and oriented  Airway and Oxygen Therapy: Patient Spontanous Breathing  Post-op Pain: mild  Post-op Assessment: Patient's Cardiovascular Status Stable, Respiratory Function Stable, No signs of Nausea or vomiting and Pain level controlled  Post-op Vital Signs: stable  Complications: No apparent anesthesia complications

## 2013-06-13 NOTE — OR Nursing (Signed)
09:50 blood infusion complete. Blood set d/c> [employer],

## 2013-06-14 ENCOUNTER — Inpatient Hospital Stay (HOSPITAL_COMMUNITY): Payer: No Typology Code available for payment source

## 2013-06-14 ENCOUNTER — Encounter (HOSPITAL_COMMUNITY): Payer: Self-pay | Admitting: Obstetrics and Gynecology

## 2013-06-14 LAB — CBC WITH DIFFERENTIAL/PLATELET
Basophils Absolute: 0 10*3/uL (ref 0.0–0.1)
Basophils Relative: 0 % (ref 0–1)
EOS ABS: 0.4 10*3/uL (ref 0.0–0.7)
EOS PCT: 3 % (ref 0–5)
HEMATOCRIT: 25 % — AB (ref 36.0–46.0)
HEMOGLOBIN: 7.8 g/dL — AB (ref 12.0–15.0)
LYMPHS PCT: 25 % (ref 12–46)
Lymphs Abs: 3.2 10*3/uL (ref 0.7–4.0)
MCH: 24.5 pg — AB (ref 26.0–34.0)
MCHC: 31.2 g/dL (ref 30.0–36.0)
MCV: 78.6 fL (ref 78.0–100.0)
MONO ABS: 1 10*3/uL (ref 0.1–1.0)
MONOS PCT: 8 % (ref 3–12)
Neutro Abs: 8.5 10*3/uL — ABNORMAL HIGH (ref 1.7–7.7)
Neutrophils Relative %: 65 % (ref 43–77)
Platelets: 311 10*3/uL (ref 150–400)
RBC: 3.18 MIL/uL — AB (ref 3.87–5.11)
RDW: 16.8 % — ABNORMAL HIGH (ref 11.5–15.5)
WBC: 13.1 10*3/uL — AB (ref 4.0–10.5)

## 2013-06-14 LAB — COMPREHENSIVE METABOLIC PANEL
ALT: 11 U/L (ref 0–35)
AST: 11 U/L (ref 0–37)
Albumin: 2.5 g/dL — ABNORMAL LOW (ref 3.5–5.2)
Alkaline Phosphatase: 47 U/L (ref 39–117)
BUN: 5 mg/dL — AB (ref 6–23)
CALCIUM: 7.7 mg/dL — AB (ref 8.4–10.5)
CO2: 25 meq/L (ref 19–32)
Chloride: 102 mEq/L (ref 96–112)
Creatinine, Ser: 0.75 mg/dL (ref 0.50–1.10)
GFR calc non Af Amer: >90 mL/min (ref >90)
GLUCOSE: 189 mg/dL — AB (ref 70–99)
Potassium: 3.5 mEq/L — ABNORMAL LOW (ref 3.7–5.3)
Sodium: 138 mEq/L (ref 137–147)
TOTAL PROTEIN: 5.5 g/dL — AB (ref 6.0–8.3)
Total Bilirubin: 0.4 mg/dL (ref 0.3–1.2)

## 2013-06-14 LAB — TYPE AND SCREEN
ABO/RH(D): O POS
Antibody Screen: NEGATIVE
UNIT DIVISION: 0
Unit division: 0

## 2013-06-14 LAB — CBC
HCT: 24.3 % — ABNORMAL LOW (ref 36.0–46.0)
HEMATOCRIT: 24.7 % — AB (ref 36.0–46.0)
Hemoglobin: 7.7 g/dL — ABNORMAL LOW (ref 12.0–15.0)
Hemoglobin: 7.9 g/dL — ABNORMAL LOW (ref 12.0–15.0)
MCH: 24.6 pg — AB (ref 26.0–34.0)
MCH: 24.9 pg — ABNORMAL LOW (ref 26.0–34.0)
MCHC: 31.7 g/dL (ref 30.0–36.0)
MCHC: 32 g/dL (ref 30.0–36.0)
MCV: 77.6 fL — ABNORMAL LOW (ref 78.0–100.0)
MCV: 77.9 fL — ABNORMAL LOW (ref 78.0–100.0)
PLATELETS: 338 10*3/uL (ref 150–400)
Platelets: 322 10*3/uL (ref 150–400)
RBC: 3.13 MIL/uL — AB (ref 3.87–5.11)
RBC: 3.17 MIL/uL — AB (ref 3.87–5.11)
RDW: 16.6 % — ABNORMAL HIGH (ref 11.5–15.5)
RDW: 16.6 % — ABNORMAL HIGH (ref 11.5–15.5)
WBC: 12.1 10*3/uL — AB (ref 4.0–10.5)
WBC: 14.4 10*3/uL — ABNORMAL HIGH (ref 4.0–10.5)

## 2013-06-14 MED ORDER — FENTANYL 10 MCG/ML IV SOLN
INTRAVENOUS | Status: DC
  Administered 2013-06-14: 22:00:00 via INTRAVENOUS
  Administered 2013-06-15: 165 ug via INTRAVENOUS
  Administered 2013-06-15: 04:00:00 via INTRAVENOUS
  Administered 2013-06-15 (×2): 300 ug via INTRAVENOUS
  Filled 2013-06-14 (×2): qty 50

## 2013-06-14 MED ORDER — DIPHENHYDRAMINE HCL 12.5 MG/5ML PO ELIX: 12.5000 mg | ORAL_SOLUTION | Freq: Four times a day (QID) | ORAL | Status: DC | PRN

## 2013-06-14 MED ORDER — ONDANSETRON HCL 4 MG/2ML IJ SOLN
4.0000 mg | Freq: Four times a day (QID) | INTRAMUSCULAR | Status: DC | PRN
Start: 2013-06-14 — End: 2013-06-15

## 2013-06-14 MED ORDER — DOCUSATE SODIUM 100 MG PO CAPS
100.0000 mg | ORAL_CAPSULE | Freq: Every day | ORAL | Status: DC
  Administered 2013-06-15 – 2013-06-17 (×3): 100 mg via ORAL
  Filled 2013-06-14 (×3): qty 1

## 2013-06-14 MED ORDER — DIPHENHYDRAMINE HCL 12.5 MG/5ML PO ELIX
12.5000 mg | ORAL_SOLUTION | Freq: Four times a day (QID) | ORAL | Status: DC | PRN
  Administered 2013-06-15: 12.5 mg via ORAL
  Filled 2013-06-14: qty 5

## 2013-06-14 MED ORDER — SODIUM CHLORIDE 0.9 % IJ SOLN: 9.0000 mL | INTRAMUSCULAR | Status: DC | PRN

## 2013-06-14 MED ORDER — ONDANSETRON HCL 4 MG/2ML IJ SOLN
4.0000 mg | Freq: Four times a day (QID) | INTRAMUSCULAR | Status: DC | PRN
  Administered 2013-06-14: 4 mg via INTRAVENOUS
  Filled 2013-06-14: qty 2

## 2013-06-14 MED ORDER — HYDROMORPHONE 0.3 MG/ML IV SOLN
INTRAVENOUS | Status: DC
  Administered 2013-06-14: 1.39 mg via INTRAVENOUS
  Administered 2013-06-14: 15:00:00 via INTRAVENOUS
  Administered 2013-06-14: 4.79 mg via INTRAVENOUS
  Filled 2013-06-14: qty 25

## 2013-06-14 MED ORDER — HYDROMORPHONE HCL 2 MG PO TABS
2.0000 mg | ORAL_TABLET | ORAL | Status: DC | PRN
  Administered 2013-06-14: 2 mg via ORAL
  Filled 2013-06-14: qty 1

## 2013-06-14 MED ORDER — IOHEXOL 300 MG/ML  SOLN
50.0000 mL | INTRAMUSCULAR | Status: AC
  Administered 2013-06-14 (×2): 50 mL via ORAL

## 2013-06-14 MED ORDER — NALOXONE HCL 0.4 MG/ML IJ SOLN: 0.4000 mg | INTRAMUSCULAR | Status: DC | PRN

## 2013-06-14 MED ORDER — DIPHENHYDRAMINE HCL 50 MG/ML IJ SOLN
12.5000 mg | Freq: Four times a day (QID) | INTRAMUSCULAR | Status: DC | PRN
  Administered 2013-06-14 (×3): 12.5 mg via INTRAVENOUS
  Filled 2013-06-14 (×2): qty 1

## 2013-06-14 MED ORDER — DIPHENHYDRAMINE HCL 50 MG/ML IJ SOLN
12.5000 mg | Freq: Four times a day (QID) | INTRAMUSCULAR | Status: DC | PRN
  Administered 2013-06-15 (×3): 12.5 mg via INTRAVENOUS
  Filled 2013-06-14 (×2): qty 1

## 2013-06-14 NOTE — Op Note (Signed)
NAMEHONORA, Teresa Camacho                 ACCOUNT NO.:  [id number]  MEDICAL RECORD NO.:  [id number]  LOCATION:  9317                          FACILITY:  [facility]  PHYSICIAN:  Monia Sabal. Corinna Capra, M.D.    DATE OF BIRTH:  08-28-1965  DATE OF PROCEDURE:  06/13/2013 DATE OF DISCHARGE:                              OPERATIVE REPORT   PREOPERATIVE DIAGNOSIS: Abnormal uterine bleeding, menorrhagia, uterine fibroids, anemia, and vaginal skin tag which should be removed  POSTOPERATIVE DIAGNOSIS:Abnormal uterine bleeding, menorrhagia,  uterine fibroids, anemia and vaginal skin tag which should be removed  PROCEDURE:  Total abdominal hysterectomy and excision of vaginal skin tag.  SURGEON:  Monia Sabal. Corinna Capra, M.D.  ASSISTANTMaisie Fus, M.D.  ANESTHESIA:  General.  INDICATIONS:  Ms. Haughton presented with vaginal bleeding and fibroids, she presented two weeks ago, she was transferred from another doctor having been transfused 3 units of blood with severe anemia to hgb 6 after given IV Premarin. She presents to office for evaluation.  She had a 2nd admission to women's for heavy bleeding requiring IV premarin.   She desired definitive surgical intervention and requested hysterectomy.  Also has large vaginal skin tag, which she wants excised.  Since planning the surgery, she had one other evaluation in the emergency room with increasing vaginal bleeding, which required IV premarin  She is otherwise stable as on 3 times daily birth control pills for the last 5 days.  She Underwent US with largest fibroid measured 7.7, mild vaginal bleeding.  We plan to procede with the procedure and transfuse with 2 more units of packed red blood cells.  The patient given written informed consent for this.  We also discussed the risks and benefits of the hysterectomy including, but not limited to, infection, bleeding, damage to adjacent organs, possibility of bleeding,risks of infection, complications associated with anesthesia,  she gives informed consent and wished to proceed.  FINDINGS AT THE TIME OF SURGERY:  At the time of surgery 8 weeks sized fibroid uterus, scar tissue from previous C/S  to the bladder and the pelvic sidewall, otherwise normal-appearing ovaries.  DESCRIPTION OF PROCEDURE:  After adequate analgesia, the patient was placed in supine position.  She was sterilely prepped and draped.  A large 65mm skin tag removed from the left labia minora minimal bleeding was noted and this was sent to pathology.  A Pfannenstiel skin incision was made 2 fingerbreadths above the pubic symphysis, taken down sharply.  The fascia was incised transversely, extended superiorly and inferiorly off the bellies of rectus muscle. Rectus muscle were separated sharply in the midline.  Peritoneum was then grasped sharply entered a and a retractor was placed the uterus  was elevated with a thyroid tenaculum.  LigaSure instrument was used to ligate across the left and right utero-ovarian ligaments bilaterally down across the round ligament down to the uterine vasculature bilaterally with the ovaries falling laterally.  Uterine vasculature was then ligated bilaterally with the LigaSure instrument. LigaSure instrument was used to down to the utero sacral ligament.  The uterus and cervix were noted to be intact and cervix completely removed with the specimen. The vagina was then closed also  with figure-of-eight with 0-Monocryl suture and plicated in the midline.  A small amount of bleeding noted on the right side.  Good hemostasis achieved with noted cautery. bilaterally due to scar tissue in that area Small edges of the adhesions did not coagulate with pressure Bovie cautery.  So, A small amount of Surgicel was placed in the with good hemostasis was achieved.  After careful  Evaluation and irrigation the retractor was As was the  packing.  The peritoneum was then closed with 2-0 Vicryl in running fashion.  The rectus muscle  was plicated in the midline.  The fascia was closed with 0-PDS double loop suture with good hemostasis, skin stapled. Steri-Strips applied. Sponge, instrument, and needle count was normal x3.  Estimated blood loss was 500 mL. The patient received 2 g of cefotetan preoperatively.     Monia Sabal Corinna Capra, M.D.     DCL/MEDQ  D:  06/13/2013  T:  06/14/2013  Job:  [id number]

## 2013-06-14 NOTE — Progress Notes (Signed)
Patient continues to c/o pain after receiving 2 percocet @ 1115.  Vital signs stable.  Abd. Soft to palpation.  Dr. Corinna Capra notified and orders received.

## 2013-06-14 NOTE — Progress Notes (Signed)
Patient ID: Teresa Camacho, female   DOB: Feb 20, 1966, 48 y.o.   MRN: [id number] Pt c/o worsening abd pain, intermittant and stabbing.  Voiding without difficulty No bleeding, tolerating po but not hungry No flatus VSSAF UOP good Repeat CBC hgb increased to 7.9  Abd  Obese, diffusely tender (mild)  Incision CD and Intact BS +  Bil flank nt  Post op pain - doesn't seem to be improving.  Now requiring IV pain med.  Plan CBC, C met pkg, and discussed with Radiology and plan CT pelvis with contrast to evaluate pelvis, bladder and ureters. DL

## 2013-06-14 NOTE — Progress Notes (Signed)
1 Day Post-Op Procedure(s) (LRB): HYSTERECTOMY ABDOMINAL and removal of vaginal skin tag (N/A)  Subjective: Patient reports nausea and no problems voiding.  Some itching Pain well controlled.  Has only nippled at breakfast  Objective: I have reviewed patient's vital signs, intake and output, medications and labs.  General: alert, cooperative, appears stated age and no distress GI: soft, non-tender; bowel sounds normal; no masses,  no organomegaly and incision: bandage dry Vaginal Bleeding: none  Assessment: s/p Procedure(s): HYSTERECTOMY ABDOMINAL and removal of vaginal skin tag (N/A): stable and progressing well  Plan: Advance diet Encourage ambulation Advance to PO medication  LOS: 1 day    Tyjae Shvartsman C 06/14/2013, 8:56 AM

## 2013-06-14 NOTE — Progress Notes (Signed)
Ur chart review completed.  

## 2013-06-14 NOTE — Progress Notes (Signed)
Patient continues to c/o abd. Pain. Vital signs remain stable.  Dr. Corinna Capra notified and orders received.

## 2013-06-15 ENCOUNTER — Encounter (HOSPITAL_COMMUNITY): Payer: Self-pay

## 2013-06-15 MED ORDER — OXYCODONE HCL 5 MG PO TABS
10.0000 mg | ORAL_TABLET | ORAL | Status: DC | PRN
  Administered 2013-06-15 (×2): 10 mg via ORAL
  Filled 2013-06-15 (×2): qty 2

## 2013-06-15 MED ORDER — IOHEXOL 300 MG/ML  SOLN
100.0000 mL | Freq: Once | INTRAMUSCULAR | Status: AC | PRN
  Administered 2013-06-15: 100 mL via INTRAVENOUS

## 2013-06-15 MED ORDER — OXYCODONE HCL 5 MG PO TABS
5.0000 mg | ORAL_TABLET | ORAL | Status: DC | PRN
  Administered 2013-06-15 – 2013-06-16 (×3): 10 mg via ORAL
  Filled 2013-06-15 (×3): qty 2

## 2013-06-15 NOTE — Progress Notes (Signed)
2 Days Post-Op Procedure(s) (LRB): HYSTERECTOMY ABDOMINAL and removal of vaginal skin tag (N/A)  Subjective: Patient reports nausea and no problems voiding. Pain improved from last night.  Fentanyl much better relief and no itching. I discussed results of CT with patient from last night.  Objective: I have reviewed patient's vital signs, intake and output, medications and CT results.  General: alert, cooperative, appears stated age and no distress Resp: clear to auscultation bilaterally GI: soft, non-tender; bowel sounds normal; no masses,  no organomegaly Vaginal Bleeding: none Incision, CDI - tegaderm removed which had small to moderate blood on honeycomb.  No evidence of bleeding or seperation noted  Assessment: s/p Procedure(s): HYSTERECTOMY ABDOMINAL and removal of vaginal skin tag (N/A): stable  Plan: Advance diet Encourage ambulation Advance to PO medication CT showed normal post op changes without ureter or bladder or bowel complications Did show right renal lesion which will need MRI for better clarification at some point.  Family aware and will schedule as outpatient  LOS: 2 days    Teresa Camacho 06/15/2013, 11:00 AM

## 2013-06-15 NOTE — Progress Notes (Signed)
Pt states after switching to Fentanyl PCA from Dilaudid, more comfortable with pain and itching is less severe at this time

## 2013-06-16 MED ORDER — PROMETHAZINE HCL 25 MG/ML IJ SOLN
12.5000 mg | Freq: Four times a day (QID) | INTRAMUSCULAR | Status: DC | PRN
  Administered 2013-06-16 (×2): 25 mg via INTRAVENOUS
  Filled 2013-06-16 (×2): qty 1

## 2013-06-16 MED ORDER — DIPHENHYDRAMINE HCL 25 MG PO CAPS
25.0000 mg | ORAL_CAPSULE | Freq: Four times a day (QID) | ORAL | Status: DC | PRN
  Administered 2013-06-16 – 2013-06-18 (×4): 25 mg via ORAL
  Filled 2013-06-16 (×4): qty 1

## 2013-06-16 MED ORDER — METOCLOPRAMIDE HCL 10 MG PO TABS
10.0000 mg | ORAL_TABLET | Freq: Three times a day (TID) | ORAL | Status: DC
  Administered 2013-06-16 – 2013-06-18 (×6): 10 mg via ORAL
  Filled 2013-06-16 (×6): qty 1

## 2013-06-16 MED ORDER — FAMOTIDINE IN NACL 20-0.9 MG/50ML-% IV SOLN
20.0000 mg | Freq: Two times a day (BID) | INTRAVENOUS | Status: DC
  Administered 2013-06-16: 20 mg via INTRAVENOUS
  Filled 2013-06-16 (×2): qty 50

## 2013-06-16 MED ORDER — ONDANSETRON 8 MG PO TBDP
8.0000 mg | ORAL_TABLET | Freq: Three times a day (TID) | ORAL | Status: DC | PRN
  Administered 2013-06-16: 8 mg via ORAL
  Filled 2013-06-16: qty 1

## 2013-06-16 MED ORDER — LACTATED RINGERS IV SOLN
INTRAVENOUS | Status: DC
  Administered 2013-06-16: 16:00:00 via INTRAVENOUS

## 2013-06-16 MED ORDER — LACTATED RINGERS IV BOLUS (SEPSIS)
500.0000 mL | Freq: Once | INTRAVENOUS | Status: AC
  Administered 2013-06-16: 500 mL via INTRAVENOUS

## 2013-06-16 MED ORDER — KETOROLAC TROMETHAMINE 30 MG/ML IJ SOLN
30.0000 mg | Freq: Four times a day (QID) | INTRAMUSCULAR | Status: DC
  Administered 2013-06-16 – 2013-06-17 (×3): 30 mg via INTRAVENOUS
  Filled 2013-06-16 (×3): qty 1

## 2013-06-16 NOTE — Progress Notes (Signed)
3 Days Post-Op Procedure(s) (LRB): HYSTERECTOMY ABDOMINAL and removal of vaginal skin tag (N/A)  Subjective: Patient reports nausea, + flatus and no problems voiding.    Objective: I have reviewed patient's vital signs, intake and output, medications and labs.  General: alert, cooperative, appears stated age and mild distress GI: soft, non-tender; bowel sounds normal; no masses,  no organomegaly and incision: clean, dry and serous drainage present  Assessment: s/p Procedure(s): HYSTERECTOMY ABDOMINAL and removal of vaginal skin tag (N/A): stable, not tolerating diet and nausea is worseining  Plan: I had a long discussion wht the Sina with her sister present Her main pain and problem seems to be intermittant Abdominal pain, with occassional stabbing pains.  Incisional pain is sore, but not the problem.  She passes flatus but doesn't have much of an appetite.  She is very sensitvive to pain meds, and has only really had relief with fentanyl PCA.   I discussed different pain meds, she can't take ultram, and the oxycodone makes her itch and doesn't provide much relief.  Dilaudid IV and po had similar responses. I suggested we discontinue all pain meds and start Toradol, IV pepcid, change from zofran to Phenergan, and begin reglan q AC.  I want to aggressively manage the nausea, but avoid anything that may make her nauseated..  If nausea and abd pain persists, consider GI referral.   LOS: 3 days    Teresa Camacho 06/16/2013, 3:12 PM

## 2013-06-16 NOTE — Progress Notes (Signed)
Went to check on patient. RN said she is sleeping but family member in room. Went to room (patient asleep) and quietly Introduced myself and let family know i can be called any time tonight for any issue with Respiratory.

## 2013-06-16 NOTE — Progress Notes (Signed)
06/16/13 1200  Clinical Encounter Type  Visited With Patient and family together  Visit Type Initial  Referral From Nurse  Spiritual Encounters  Spiritual Needs (not yet assessed)   Attempted visit per RN referral, but, per pt's sister, pt and sister were busy ordering lunch and this was not a good time.  Briefly introduced chaplain availability.  Plan to attempt follow-up visit later this afternoon, but please also page as timing would be appropriate (this afternoon or over weekend if needed):  [phone].  Thank you.  [place], [place]

## 2013-06-16 NOTE — Progress Notes (Signed)
Pt and sister voiced concern about beeping pulse ox machine... Pt and sister both stated that machine is bothersome due to the constant beeping it makes when pt falls asleep... Sister states that pt, "drops her sats into the 80's and the machine constantly beeps." Pt and sister both requesting for respiratory to come and check the settings on the CPAP machine, and pulse ox machine... Respiratory notified and Kennyth Lose, RT on floor at 1015 to assess pt.

## 2013-06-16 NOTE — Progress Notes (Signed)
Called at approx 10 am by RN Teresa Camacho) to assess [name] for CPAP settings and desaturation episodes.  I entered the room to find pt with her CPAP properly placed on her face with nasal pillows in proper position and the NELCOR pulse oximeter turned off.  I introduced myself and asked the pt to describe to me what problems she was having.  Her family members immediately interrupted and told me that over the course of the night and this morning, her pulse oximeter had been alarming constantly with saturations in the upper 80s and that they had turned it off for annoyance reasons.  They asked me if I could make adjustments to her CPAP settings or start her back on oxygen.  I responded by turning on her pulse oximeter and listening to her breath sounds.  Her Sats were consistently >91%, even while off of the CPAP to speak and her breath sounds were slightly diminished in the bases but clear bilaterally.  I told her that I could not start oxygen or make setting changes without physician orders but would consult with RN and physician about a plan.  I asked pt the name of her Pulmonologist that is following her regarding her home CPAP.  She stated that she hasn't followed up with the Pulmonologist since the inception of her CPAP 8 years ago because she does not have the money to do so.  I explained to her the importance of good deep breathing and ambulatory activity to keep her lung fields open and clear and also to leave the pulse oximeter on at all times in order for [place] to track desaturation trends.  From what I understand from the RN and the family members, her desaturations occur when she is still and in a deep sleep....possibly secondary to pain medications.  Will continue to follow.

## 2013-06-17 ENCOUNTER — Inpatient Hospital Stay (HOSPITAL_COMMUNITY): Payer: No Typology Code available for payment source

## 2013-06-17 DIAGNOSIS — M79609 Pain in unspecified limb: Secondary | ICD-10-CM

## 2013-06-17 MED ORDER — FAMOTIDINE 20 MG PO TABS
20.0000 mg | ORAL_TABLET | Freq: Two times a day (BID) | ORAL | Status: DC
  Administered 2013-06-17 – 2013-06-18 (×3): 20 mg via ORAL
  Filled 2013-06-17 (×3): qty 1

## 2013-06-17 MED ORDER — KETOROLAC TROMETHAMINE 10 MG PO TABS
10.0000 mg | ORAL_TABLET | Freq: Four times a day (QID) | ORAL | Status: DC
  Administered 2013-06-17 – 2013-06-18 (×5): 10 mg via ORAL
  Filled 2013-06-17 (×11): qty 1

## 2013-06-17 MED ORDER — PROMETHAZINE HCL 25 MG RE SUPP: 25.0000 mg | Freq: Four times a day (QID) | RECTAL | Status: DC | PRN

## 2013-06-17 MED ORDER — PROMETHAZINE HCL 25 MG PO TABS
25.0000 mg | ORAL_TABLET | Freq: Four times a day (QID) | ORAL | Status: DC | PRN
  Administered 2013-06-17 – 2013-06-18 (×5): 25 mg via ORAL
  Filled 2013-06-17 (×5): qty 1

## 2013-06-17 MED ORDER — HYDROMORPHONE HCL PF 1 MG/ML IJ SOLN
2.0000 mg | INTRAMUSCULAR | Status: DC | PRN
  Administered 2013-06-17: 2 mg via INTRAVENOUS
  Filled 2013-06-17 (×2): qty 2

## 2013-06-17 MED ORDER — HYDROMORPHONE HCL 2 MG PO TABS
2.0000 mg | ORAL_TABLET | ORAL | Status: DC | PRN
  Administered 2013-06-17 – 2013-06-18 (×8): 2 mg via ORAL
  Filled 2013-06-17 (×8): qty 1

## 2013-06-17 MED ORDER — PROMETHAZINE HCL 25 MG/ML IJ SOLN: 25.0000 mg | Freq: Four times a day (QID) | INTRAMUSCULAR | Status: DC | PRN

## 2013-06-17 MED ORDER — DIPHENHYDRAMINE HCL 50 MG/ML IJ SOLN
12.5000 mg | Freq: Four times a day (QID) | INTRAMUSCULAR | Status: DC | PRN
  Administered 2013-06-17: 12.5 mg via INTRAVENOUS
  Filled 2013-06-17: qty 1

## 2013-06-17 NOTE — Progress Notes (Signed)
Pt c/o pain in the back of both of her calves. Rates the pain 3 out of 10 and constant. Describes it as a "straight down the back of her calves and feels like its tight like swelling, and that it  hurts more when I am walking.". Repositioned pt and elevated her lower extremeties. +1 BLE pitting edema, +2 dorsalis pedal pulses, negative Homan's sign,  skin warm to touch, no redness noted. Dr. Corinna Capra notified and orders given for BLE venous doppler studies. Will continue to monitor.

## 2013-06-17 NOTE — Progress Notes (Signed)
VASCULAR LAB PRELIMINARY  PRELIMINARY  PRELIMINARY  PRELIMINARY  Bilateral lower extremity venous Dopplers completed.    Preliminary report:  There is no DVT or SVT noted in the bilateral lower extremities.  Chade Pitner, RVT 06/17/2013, 6:11 PM

## 2013-06-17 NOTE — Progress Notes (Signed)
4 Days Post-Op Procedure(s) (LRB): HYSTERECTOMY ABDOMINAL and removal of vaginal skin tag (N/A)  Subjective: Patient reports nausea, vomiting, + flatus and + BM.   Pt reports still with some constant and some stabbing abd pain.  Not incisional.  Nausea has improved since off percocet but pain relief at first was better with toradol but required IV dilaudid this am for pain and had some relief.  Has been able to eat, +flatus and +BM    Objective: I have reviewed patient's vital signs, intake and output and medications.  General: alert, cooperative, appears stated age and mild distress GI: soft, non-tender; bowel sounds normal; no masses,  no organomegaly and incision: clean, dry and intact Area of pain is both R and L upper abd and not reproducable.  Incision and area around it is not tender, and not red  Assessment: s/p Procedure(s): HYSTERECTOMY ABDOMINAL and removal of vaginal skin tag (N/A): stable and Continued pain management issues  Plan: Plan to switch IV meds back to oralToradol, Pepcid, Reglan, and the patient and her family want to try oral dilaudid as needed for pain today Consider GI eval.  Plan Abd xray for ? Partial SBO (but nausea better and pain not worse with Reglan and she wishes to continue with it)   LOS: 4 days    Akoni Parton C 06/17/2013, 10:02 AM

## 2013-06-17 NOTE — Progress Notes (Signed)
Dr. Corinna Capra notified of doppler results, negative for DVT. No new orders at this time. Will continue to monitor.

## 2013-06-17 NOTE — Progress Notes (Signed)
Dr. Corinna Capra notified of x-ray results. No new orders given. Will continue to monitor pt.

## 2013-06-17 NOTE — Progress Notes (Signed)
Pt's sister complains that pt has been in pain all night. When I go in to assess the pt, pt is sleeping and has to be woken up to assess pain and to be given pain medication. Pt constantly rates pain as a six even when she is falling asleep while I am present in the room. Hourly rounding was done throughout the night and pt was sleeping through the night. Pt woke up once at 0140 and states she was in pain. I gave the patient options and she decided to wait until Toradol was scheduled. Multiple pain medications have been given throughout the pt's stay due to sister's request to try something else for pain. Dr. Helane Rima was contacted at [phone]. See orders

## 2013-06-17 NOTE — Progress Notes (Signed)
Pt requesting her next does of Toradol 10 mg PO for pain, rates pain 8 out of 10. Stated she did get some mild relief from the Dilaudid 2 mg PO given @ 13:23.  Advised pt that the earliest I could give her the next does was @ 1545. Pt stated that was fine, offered to call Dr. Corinna Capra for additional pain medication. Patient stated that was not necessary. Will continue to monitor.

## 2013-06-18 MED ORDER — KETOROLAC TROMETHAMINE 10 MG PO TABS: 10.0000 mg | ORAL_TABLET | Freq: Four times a day (QID) | ORAL | Status: DC

## 2013-06-18 MED ORDER — METOCLOPRAMIDE HCL 10 MG PO TABS: 10.0000 mg | ORAL_TABLET | Freq: Three times a day (TID) | ORAL | Status: DC

## 2013-06-18 MED ORDER — PROMETHAZINE HCL 25 MG PO TABS: 25.0000 mg | ORAL_TABLET | Freq: Four times a day (QID) | ORAL | Status: DC | PRN

## 2013-06-18 MED ORDER — AMOXICILLIN 875 MG PO TABS: 875.0000 mg | ORAL_TABLET | Freq: Two times a day (BID) | ORAL | Status: DC

## 2013-06-18 MED ORDER — ONDANSETRON 8 MG PO TBDP: 8.0000 mg | ORAL_TABLET | Freq: Three times a day (TID) | ORAL | Status: DC | PRN

## 2013-06-18 MED ORDER — FAMOTIDINE 20 MG PO TABS: 20.0000 mg | ORAL_TABLET | Freq: Two times a day (BID) | ORAL | Status: DC

## 2013-06-18 MED ORDER — HYDROMORPHONE HCL 2 MG PO TABS: 2.0000 mg | ORAL_TABLET | ORAL | Status: DC | PRN

## 2013-06-18 MED ORDER — DIPHENHYDRAMINE HCL 25 MG PO CAPS: 25.0000 mg | ORAL_CAPSULE | Freq: Four times a day (QID) | ORAL | Status: DC | PRN

## 2013-06-18 NOTE — Discharge Summary (Signed)
Physician Discharge Summary  Patient ID: Teresa Camacho MRN: [id number] DOB/AGE: 1965-11-26 48 y.o.  Admit date: 06/13/2013 Discharge date: 06/18/2013  Admission Diagnoses:Anemia, fibroids, AUB  Discharge Diagnoses: Same plus Incisional separation, abdominal pain Active Problems:   S/P hysterectomy   Discharged Condition: good  Hospital Course: Pt underwent an uncomplicated TAH and received 2 u PRBCs during surgery due to her anemia preop.  Her post op care was complicated by intolerance or side effects to most pain medicines and refractory nausea.  On post op D 1 her Hgb was stable at 7.7 (same as preop but have been given 2 U prbcs).    Nausea and abdominal pain had worsened and was not controlled with PCA dilaudid, so a CT of abd and pelvis was done, the results were normal (except for ? Renal lesion that will require MRI at some point).  Fentanyl PCA was then used with good results and improvement in nausea.  The next day, we transitioned her to po percocet which caused itching but benadrl helped but she required additional oxycodone on top to get some pain relief.  She continued to have severe nausea but had normal BS and passed flatus.  POD 4 we d/c'd the narcotics and switched to toradol since most of her pain was upper GI and not incisional, and got improvement in nausea and patient could tolerate po well, pass flatus and had BM.  But, family wanted Abd Xray to R/O partial SBO, which we did, and it was negative.  She also wanted to go to dilaudid oral for pain relief, and it seemed to help.  Pt had bil lower calf cramping, so dopplers were performed and they were negative (homans were negative) and her cramping has resolved.  POD 5, patient ready to try this at home, with phenergan, reglan, pepcid, toradaol (oral) and dilaudid (oral).  Staples were removed and seperation with mild to moderate serous fluid c/w seroma.  Fascia intact, and patients sister was shown how to clean and pack incision, and  she was comfortable with this.  Consults: none  Significant Diagnostic Studies: labs: post op Hgb 7.7 (normal cmet) and radiology: KUB: normal and CT scan: normal post op but right renal lesion rec MRI  Treatments: IV hydration, antibiotics: cefotetan and surgery: TAH  Discharge Exam: Blood pressure 126/81, pulse 87, temperature 98.5 F (36.9 C), temperature source Oral, resp. rate 20, height 5\' 1"  (1.549 m), weight 95.255 kg (210 lb), last menstrual period 03/13/2013, SpO2 95.00%. General appearance: alert, cooperative, appears stated age and no distress GI: soft, non-tender; bowel sounds normal; no masses,  no organomegaly Incision/Wound:sepatation superficial with fascia intact.  Cleaned and packed  Disposition: 01-Home or Self Care  Discharge Orders   Future Orders Complete By Expires   Call MD for:  difficulty breathing, headache or visual disturbances  As directed    Call MD for:  persistant nausea and vomiting  As directed    Call MD for:  redness, tenderness, or signs of infection (pain, swelling, redness, odor or green/yellow discharge around incision site)  As directed    Call MD for:  severe uncontrolled pain  As directed    Call MD for:  temperature >100.4  As directed    Diet general  As directed    Discharge instructions  As directed    Comments:     Twice daily clean and change the incision dressing as directed Call for office appointment Tuesday this week [phone])   Driving Restrictions  As directed    Comments:     No driving for 2 weeks   Increase activity slowly  As directed    Lifting restrictions  As directed    Comments:     No lifting anything greater than 10 pounds (if you have to ask, don't lift it)   Sexual Activity Restrictions  As directed    Comments:     Nothing in the vagina for 6 weeks       Medication List    STOP taking these medications       ibuprofen 200 MG tablet  Commonly known as:  ADVIL,MOTRIN     MINASTRIN 24 FE PO      naproxen 250 MG tablet  Commonly known as:  NAPROSYN     tranexamic acid 650 MG Tabs tablet  Commonly known as:  LYSTEDA      TAKE these medications       acetaminophen 500 MG tablet  Commonly known as:  TYLENOL  Take 1,000 mg by mouth every 6 (six) hours as needed for mild pain.     amoxicillin 875 MG tablet  Commonly known as:  AMOXIL  Take 1 tablet (875 mg total) by mouth 2 (two) times daily.     diphenhydrAMINE 25 mg capsule  Commonly known as:  BENADRYL  Take 1 capsule (25 mg total) by mouth every 6 (six) hours as needed for itching.     docusate sodium 100 MG capsule  Commonly known as:  COLACE  Take 100 mg by mouth 2 (two) times daily.     famotidine 20 MG tablet  Commonly known as:  PEPCID  Take 1 tablet (20 mg total) by mouth 2 (two) times daily.     HYDROmorphone 2 MG tablet  Commonly known as:  DILAUDID  Take 1 tablet (2 mg total) by mouth every 4 (four) hours as needed for moderate pain or severe pain.     IRON PO  Take 3 tablets by mouth daily.     ketorolac 10 MG tablet  Commonly known as:  TORADOL  Take 1 tablet (10 mg total) by mouth every 6 (six) hours.     MELATONIN PO  Take 1 tablet by mouth as needed (sleep).     metoCLOPramide 10 MG tablet  Commonly known as:  REGLAN  Take 1 tablet (10 mg total) by mouth 3 (three) times daily before meals.     multivitamin with minerals tablet  Take 1 tablet by mouth daily.     ondansetron 8 MG disintegrating tablet  Commonly known as:  ZOFRAN-ODT  Take 1 tablet (8 mg total) by mouth every 8 (eight) hours as needed for nausea or vomiting.     promethazine 25 MG tablet  Commonly known as:  PHENERGAN  Take 1 tablet (25 mg total) by mouth every 6 (six) hours as needed for nausea, vomiting or refractory nausea / vomiting (may take 1/2 to 1 every 6 hours as needed).         Signed: Kahealani Yankovich C 06/18/2013, 10:01 AM

## 2013-06-18 NOTE — Progress Notes (Signed)
Pt and family educated extensively on dressing changes and wound care. Family assisted RN with dressing change. 4x4 wet to dry guaze packed with normal saline, ABD pad placed on top of incision with paper tape. Pt tolerated well. Pt sent home with a weeks supply of 4x4, Normal Saline  ABD pads, paper tape to last a week. MD recommended dressing changes x2 daily. Pt will call and schedule a f/u appt with MD on Tuesday 2-3. Discharge and prescriptions reviewed with pt and family. Pt states she will be ready to leave this afternoon. No further questions at this time.

## 2013-06-18 NOTE — Progress Notes (Signed)
D/C with family via [place], stable to private car with personal belongings.

## 2013-06-28 ENCOUNTER — Inpatient Hospital Stay (HOSPITAL_COMMUNITY)
Admission: AD | Admit: 2013-06-28 | Discharge: 2013-06-28 | Disposition: A | Discharge status: Home / Self Care | Payer: No Typology Code available for payment source | Source: Ambulatory Visit | Attending: Obstetrics and Gynecology | Admitting: Obstetrics and Gynecology

## 2013-06-28 ENCOUNTER — Encounter (HOSPITAL_COMMUNITY): Payer: Self-pay | Admitting: *Deleted

## 2013-06-28 DIAGNOSIS — Y92009 Unspecified place in unspecified non-institutional (private) residence as the place of occurrence of the external cause: Secondary | ICD-10-CM | POA: Insufficient documentation

## 2013-06-28 DIAGNOSIS — Z90710 Acquired absence of both cervix and uterus: Secondary | ICD-10-CM | POA: Insufficient documentation

## 2013-06-28 DIAGNOSIS — R42 Dizziness and giddiness: Secondary | ICD-10-CM | POA: Insufficient documentation

## 2013-06-28 DIAGNOSIS — D500 Iron deficiency anemia secondary to blood loss (chronic): Secondary | ICD-10-CM | POA: Diagnosis present

## 2013-06-28 DIAGNOSIS — R5381 Other malaise: Secondary | ICD-10-CM | POA: Insufficient documentation

## 2013-06-28 DIAGNOSIS — Y93F1 Activity, caregiving, bathing: Secondary | ICD-10-CM | POA: Insufficient documentation

## 2013-06-28 DIAGNOSIS — R5383 Other fatigue: Secondary | ICD-10-CM

## 2013-06-28 DIAGNOSIS — D509 Iron deficiency anemia, unspecified: Secondary | ICD-10-CM | POA: Insufficient documentation

## 2013-06-28 DIAGNOSIS — W010XXA Fall on same level from slipping, tripping and stumbling without subsequent striking against object, initial encounter: Secondary | ICD-10-CM | POA: Insufficient documentation

## 2013-06-28 MED ORDER — LACTATED RINGERS IV BOLUS (SEPSIS)
1000.0000 mL | Freq: Once | INTRAVENOUS | Status: AC
  Administered 2013-06-28: 1000 mL via INTRAVENOUS

## 2013-06-28 MED ORDER — PROMETHAZINE HCL 25 MG/ML IJ SOLN
12.5000 mg | INTRAMUSCULAR | Status: AC
  Administered 2013-06-28: 12.5 mg via INTRAVENOUS
  Filled 2013-06-28: qty 1

## 2013-06-28 MED ORDER — FERUMOXYTOL INJECTION 510 MG/17 ML
510.0000 mg | Freq: Once | INTRAVENOUS | Status: AC
  Administered 2013-06-28: 510 mg via INTRAVENOUS
  Filled 2013-06-28: qty 17

## 2013-06-28 NOTE — MAU Note (Signed)
Pt states she has been very poorly and is feeling weak and has not been able to keep anything down

## 2013-06-28 NOTE — Progress Notes (Signed)
Pt states she fell in the shower this after noon

## 2013-06-28 NOTE — MAU Provider Note (Signed)
Chief Complaint: Fatigue   None    SUBJECTIVE HPI: Teresa Camacho is a 48 y.o. [id number] pt 15 days post complete abdominal hysterectomy who presents to maternity admissions sent from the office for iron infusion.  She reports she feels fatigued and has passed out several times before and after her surgery, including in the shower this morning.  She had heavy bleeding with uterine fibroid prior to her surgery, and reports her bleeding has completely resolved and she has no bleeding today.  She has trouble keeping down her iron and other PO medications related to daily nausea.  She reports her hemoglobin was 8.1 in the office today.  She denies vaginal itching/burning, urinary symptoms, h/a, or fever/chills.    Pt did have an episode of dizziness while walking to the bathroom in MAU but remained conscious, did not fall, and symptoms improved when pt returned to bed.  Past Medical History  Diagnosis Date  . PCOS (polycystic ovarian syndrome)   . Sleep apnea   . Blood transfusion without reported diagnosis   . Anemia   . PONV (postoperative nausea and vomiting)    Past Surgical History  Procedure Laterality Date  . Appendectomy    . Cholecystectomy    . Cesarean section      3x  . Cholecystectomy  1991  . Abdominal hysterectomy N/A 06/13/2013    Procedure: HYSTERECTOMY ABDOMINAL and removal of vaginal skin tag;  Surgeon: Luz Lex, MD;  Location: [facility];  Service: Gynecology;  Laterality: N/A;   History   Social History  . Marital Status: Married    Spouse Name: N/A    Number of Children: N/A  . Years of Education: N/A   Occupational History  . Not on file.   Social History Main Topics  . Smoking status: Never Smoker   . Smokeless tobacco: Not on file  . Alcohol Use: No  . Drug Use: No  . Sexual Activity: No   Other Topics Concern  . Not on file   Social History Narrative  . No narrative on file   No current facility-administered medications on file prior to encounter.    Current Outpatient Prescriptions on File Prior to Encounter  Medication Sig Dispense Refill  . acetaminophen (TYLENOL) 500 MG tablet Take 1,000 mg by mouth every 6 (six) hours as needed for mild pain.      Marland Kitchen amoxicillin (AMOXIL) 875 MG tablet Take 1 tablet (875 mg total) by mouth 2 (two) times daily.  14 tablet  0  . diphenhydrAMINE (BENADRYL) 25 mg capsule Take 1 capsule (25 mg total) by mouth every 6 (six) hours as needed for itching.  30 capsule  0  . docusate sodium (COLACE) 100 MG capsule Take 100 mg by mouth 2 (two) times daily.      . famotidine (PEPCID) 20 MG tablet Take 1 tablet (20 mg total) by mouth 2 (two) times daily.  60 tablet  0  . HYDROmorphone (DILAUDID) 2 MG tablet Take 1 tablet (2 mg total) by mouth every 4 (four) hours as needed for moderate pain or severe pain.  30 tablet  0  . IRON PO Take 3 tablets by mouth daily.      Marland Kitchen ketorolac (TORADOL) 10 MG tablet Take 1 tablet (10 mg total) by mouth every 6 (six) hours.  30 tablet  0  . MELATONIN PO Take 1 tablet by mouth as needed (sleep).      . metoCLOPramide (REGLAN) 10 MG tablet Take  1 tablet (10 mg total) by mouth 3 (three) times daily before meals.  50 tablet  0  . Multiple Vitamins-Minerals (MULTIVITAMIN WITH MINERALS) tablet Take 1 tablet by mouth daily.      . ondansetron (ZOFRAN-ODT) 8 MG disintegrating tablet Take 1 tablet (8 mg total) by mouth every 8 (eight) hours as needed for nausea or vomiting.  20 tablet  0  . promethazine (PHENERGAN) 25 MG tablet Take 1 tablet (25 mg total) by mouth every 6 (six) hours as needed for nausea, vomiting or refractory nausea / vomiting (may take 1/2 to 1 every 6 hours as needed).  30 tablet  0   Allergies  Allergen Reactions  . Codeine Nausea And Vomiting    Pt is able to take percocet & vicodin    ROS: Pertinent items in HPI  OBJECTIVE Blood pressure 126/58, pulse 81, resp. rate 20, last menstrual period 03/13/2013, SpO2 100.00%. BP 126/58  Pulse 81  Resp 20  SpO2 100%   LMP 03/13/2013  GENERAL: Well-developed, well-nourished female in no acute distress.  HEENT: Normocephalic HEART: normal rate RESP: normal effort ABDOMEN: Soft, non-tender EXTREMITIES: Nontender, no edema NEURO: Alert and oriented SPECULUM EXAM: Deferred  ASSESSMENT 1. Iron deficiency anemia due to chronic blood loss   2. S/P hysterectomy     PLAN Consult Dr Virgilio Frees injection 510 mg, Phenergan 12.5 mg IV, and LR 1000 ml in MAU Discharge home Return to MAU on Saturday for second Feraheme injection F/U in office Return to MAU as needed    Medication List         acetaminophen 500 MG tablet  Commonly known as:  TYLENOL  Take 1,000 mg by mouth every 6 (six) hours as needed for mild pain.     amoxicillin 875 MG tablet  Commonly known as:  AMOXIL  Take 1 tablet (875 mg total) by mouth 2 (two) times daily.     diphenhydrAMINE 25 mg capsule  Commonly known as:  BENADRYL  Take 1 capsule (25 mg total) by mouth every 6 (six) hours as needed for itching.     docusate sodium 100 MG capsule  Commonly known as:  COLACE  Take 100 mg by mouth 2 (two) times daily.     famotidine 20 MG tablet  Commonly known as:  PEPCID  Take 1 tablet (20 mg total) by mouth 2 (two) times daily.     HYDROmorphone 2 MG tablet  Commonly known as:  DILAUDID  Take 1 tablet (2 mg total) by mouth every 4 (four) hours as needed for moderate pain or severe pain.     IRON PO  Take 3 tablets by mouth daily.     ketorolac 10 MG tablet  Commonly known as:  TORADOL  Take 1 tablet (10 mg total) by mouth every 6 (six) hours.     MELATONIN PO  Take 1 tablet by mouth as needed (sleep).     metoCLOPramide 10 MG tablet  Commonly known as:  REGLAN  Take 1 tablet (10 mg total) by mouth 3 (three) times daily before meals.     multivitamin with minerals tablet  Take 1 tablet by mouth daily.     ondansetron 8 MG disintegrating tablet  Commonly known as:  ZOFRAN-ODT  Take 1 tablet (8 mg total) by  mouth every 8 (eight) hours as needed for nausea or vomiting.     promethazine 25 MG tablet  Commonly known as:  PHENERGAN  Take 1 tablet (25 mg total)  by mouth every 6 (six) hours as needed for nausea, vomiting or refractory nausea / vomiting (may take 1/2 to 1 every 6 hours as needed).       Follow-up Information   Follow up with [facility]. (Return on Saturday for another iron infusion.  Return to MAU sooner as needed.)    Contact information:   [address] [id number] [place] [address] [phone]      Fatima Blank Certified Nurse-Midwife 06/28/2013  6:46 PM

## 2013-06-28 NOTE — Discharge Instructions (Signed)
Iron Deficiency Anemia, Adult  Anemia is a condition in which there are less red blood cells or hemoglobin in the blood than normal. Hemoglobin is this part of red blood cells that carries oxygen. Iron deficiency anemia is anemia caused by too little iron. It is the most common type of anemia. It may leave you tired and short of breath.  CAUSES    Lack of iron in the diet.   Poor absorption of iron, as seen with intestinal disorders.   Intestinal bleeding.   Heavy periods.  SIGNS AND SYMPTOMS   Mild anemia may not be noticeable. Symptoms may include:   Fatigue.   Headache.   Pale skin.   Weakness.   Tiredness.   Shortness of breath.   Dizziness.   Cold hands and feet.   Fast or irregular heartbeat.  DIAGNOSIS   Diagnosis requires a thorough evaluation and physical exam by your health care provider. Blood tests are generally used to confirm iron deficiency anemia. Additional tests may be done to find the underlying cause of your anemia. These may include:   Testing for blood in the stool (fecal occult blood test).   A procedure to see inside the colon and rectum (colonoscopy).   A procedure to see inside the esophagus and stomach (endoscopy).  TREATMENT   Iron deficiency anemia is treated by correcting the cause of the deficiency. Treatment may involve:   Adding iron-rich foods to your diet.   Taking iron supplements. Pregnant or breastfeeding women need to take extra iron, because their normal diet usually does not provide the required amount.   Taking vitamins. Vitamin C improves the absorption of iron. Your health care provider may recommend taking your iron tablets with a glass of orange juice or vitamin C supplement.   Medicines to make heavy menstrual flow lighter.   Surgery.  HOME CARE INSTRUCTIONS    Take iron as directed by your health care provider.   If you cannot tolerate taking iron supplements by mouth, talk to your health care provider about taking them through a vein  (intravenously) or an injection into a muscle.   For the best iron absorption, iron supplements should be taken on an empty stomach. If you cannot tolerate them on an empty stomach, you may need to take them with food.   Do not drink milk or take antacids at the same time as your iron supplements. Milk and antacids may interfere with the absorption of iron.   Iron supplements can cause constipation. Make sure to include fiber in your diet to prevent constipation. A stool softener may also be recommended.   Take vitamins as directed by your health care provider.   Eat a diet rich in iron. Foods high in iron include liver, lean beef, whole-grain bread, eggs, dried fruit, and dark green, leafy vegetables.  SEEK IMMEDIATE MEDICAL CARE IF:    You faint. If this happens, do not drive. Call your local emergency services (911 in [place] if no other help is available.   You have chest pain.   You feel nauseous or vomit.   You have severe or increased shortness of breath with activity.   You feel weak.   You have a rapid heartbeat.   You have unexplained sweating.   You become lightheaded when getting up from a chair or bed.  MAKE SURE YOU:    Understand these instructions.   Will watch your condition.   Will get help right away if you are not 

## 2013-07-01 ENCOUNTER — Inpatient Hospital Stay (HOSPITAL_COMMUNITY)
Admission: AD | Admit: 2013-07-01 | Discharge: 2013-07-01 | Disposition: A | Discharge status: Home / Self Care | Payer: No Typology Code available for payment source | Source: Ambulatory Visit | Attending: Obstetrics and Gynecology | Admitting: Obstetrics and Gynecology

## 2013-07-01 DIAGNOSIS — D509 Iron deficiency anemia, unspecified: Secondary | ICD-10-CM | POA: Insufficient documentation

## 2013-07-01 MED ORDER — FERUMOXYTOL INJECTION 510 MG/17 ML
510.0000 mg | Freq: Once | INTRAVENOUS | Status: AC
  Administered 2013-07-01: 510 mg via INTRAVENOUS
  Filled 2013-07-01: qty 17

## 2013-07-01 MED ORDER — LACTATED RINGERS IV BOLUS (SEPSIS)
1000.0000 mL | Freq: Once | INTRAVENOUS | Status: AC
  Administered 2013-07-01: 1000 mL via INTRAVENOUS

## 2013-07-01 NOTE — MAU Note (Signed)
Pt presents for repeat iron infusion. Pt states that she still feels weak but some better than Wednesday when she receive infusion

## 2013-09-19 ENCOUNTER — Other Ambulatory Visit: Payer: Self-pay | Admitting: Dermatology

## 2014-03-19 ENCOUNTER — Encounter (HOSPITAL_COMMUNITY): Payer: Self-pay | Admitting: *Deleted

## 2014-07-07 IMAGING — CT CT ABD-PELV W/ CM
1 of 3 series · 13 of 32 positions shown, 18 images · IV contrast (OMNIPAQUE)
Comparison: None.

CLINICAL DATA: Hematuria, abdominal pain status post total
abdominal hysterectomy.

EXAM:
CT ABDOMEN AND PELVIS WITH CONTRAST
TECHNIQUE: Multidetector CT imaging of the abdomen and pelvis was performed
using the standard protocol following bolus administration of
intravenous contrast.
CONTRAST:  100mL OMNIPAQUE IOHEXOL 300 MG/ML  SOLN

[Series 2: routine abdomen/pelvis with · axial · 0.82mm/px · z∈[-375,+5]mm · 13 of 88 slices shown, 18 images]
[im 6/88  soft-tissue]
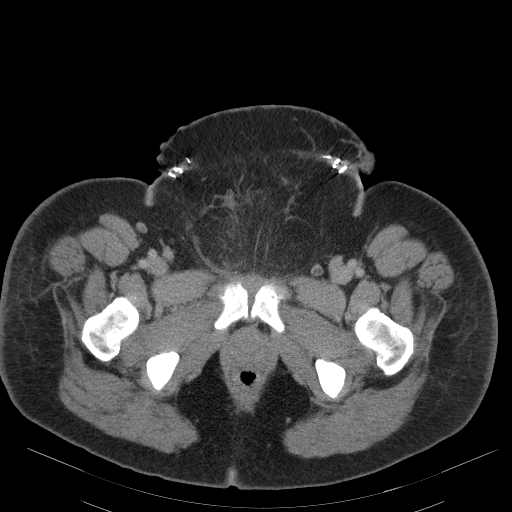
[im 6/88  bone]
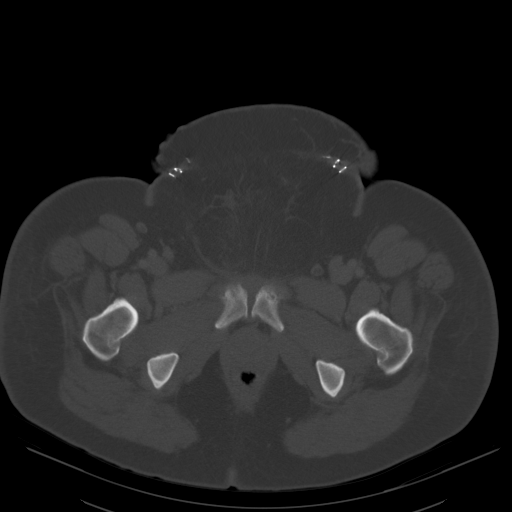
[im 11/88  soft-tissue]
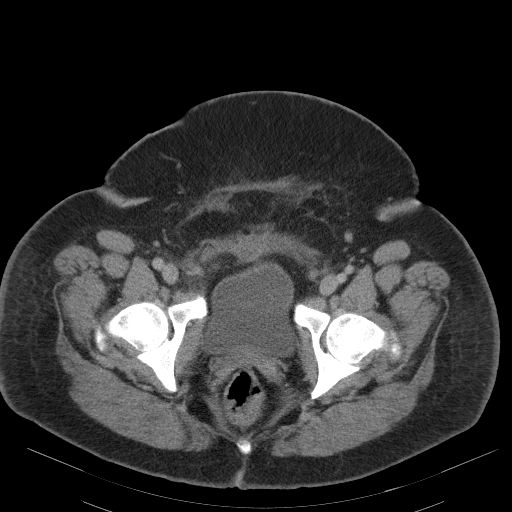
[im 22/88  soft-tissue]
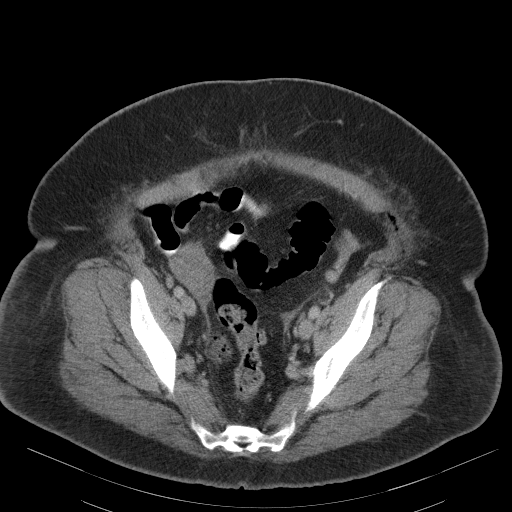
[im 28/88  soft-tissue]
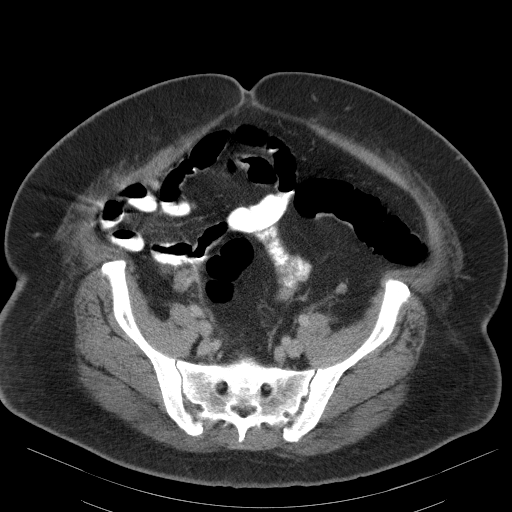
[im 33/88  soft-tissue]
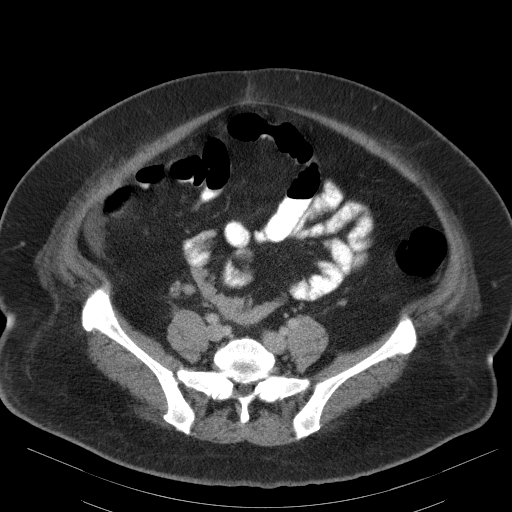
[im 39/88  soft-tissue]
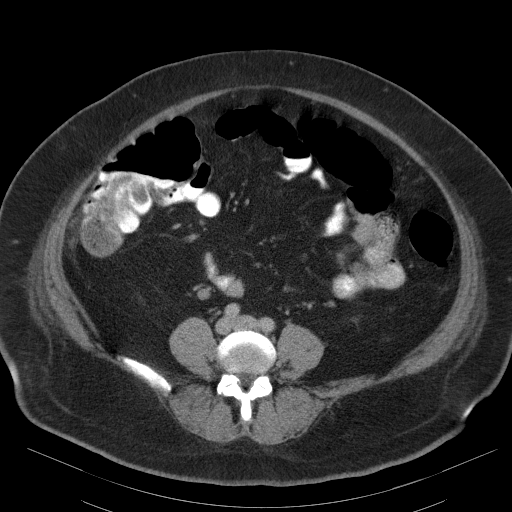
[im 49/88  soft-tissue]
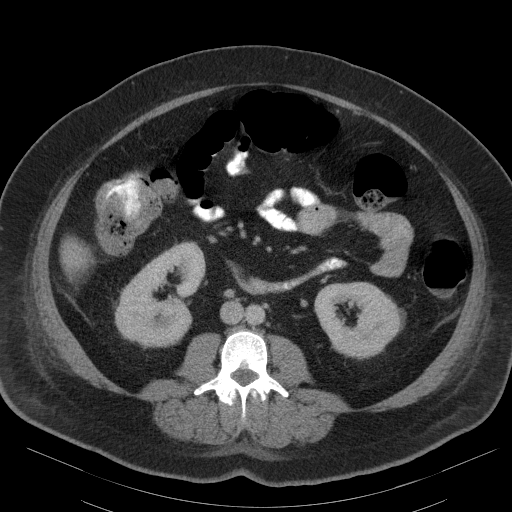
[im 55/88  soft-tissue]
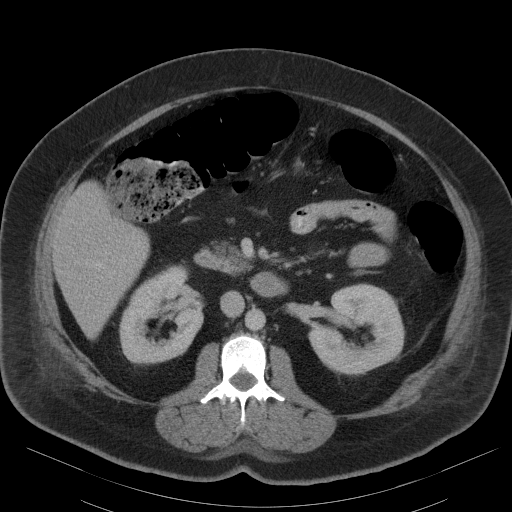
[im 60/88  soft-tissue]
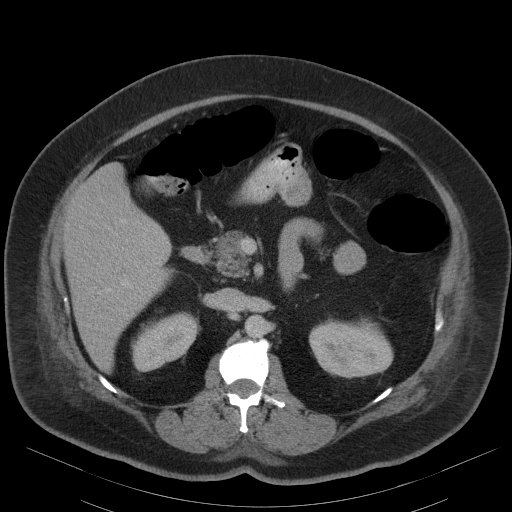
[im 60/88  bone]
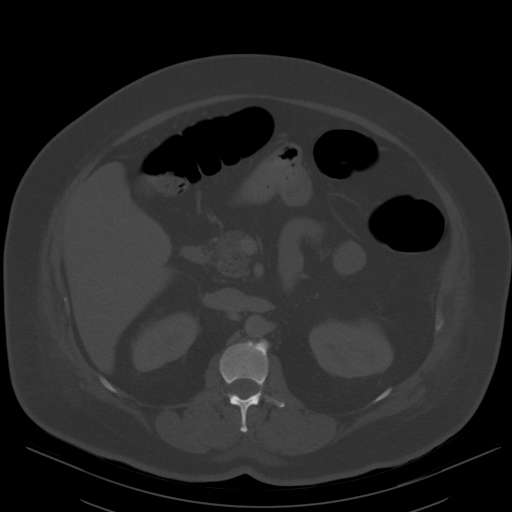
[im 66/88  soft-tissue]
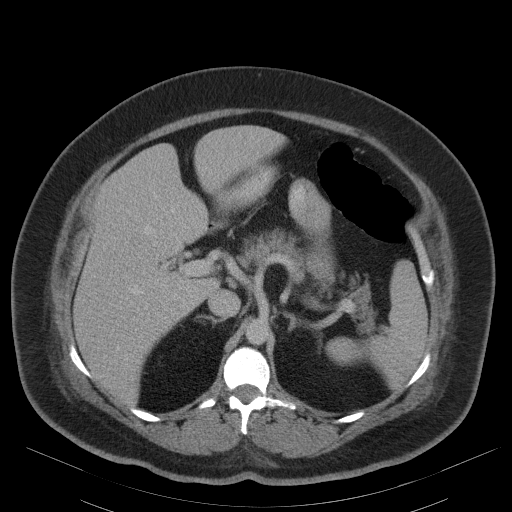
[im 66/88  lung]
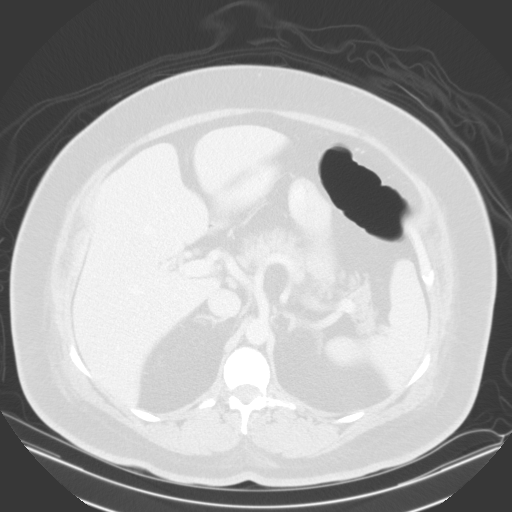
[im 71/88  lung]
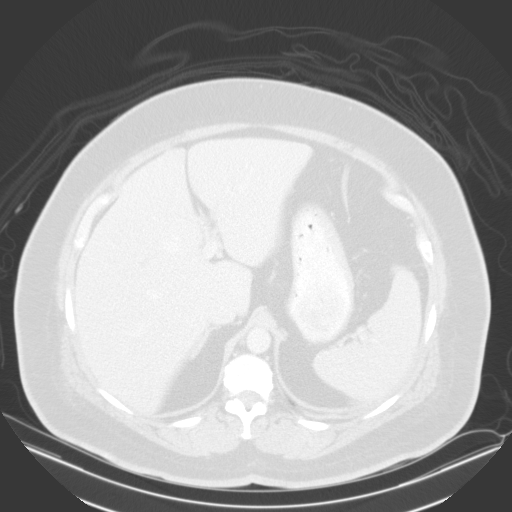
[im 77/88  soft-tissue]
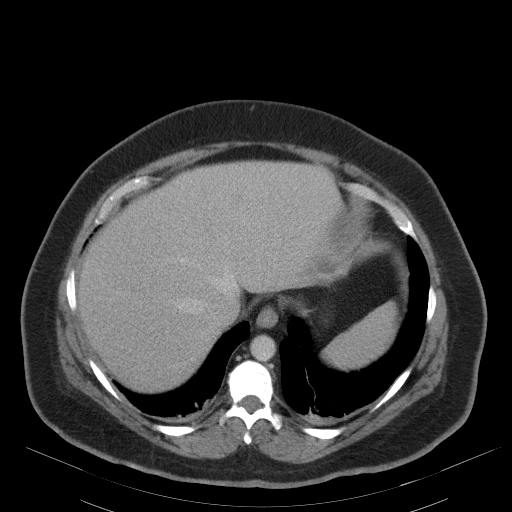
[im 77/88  lung]
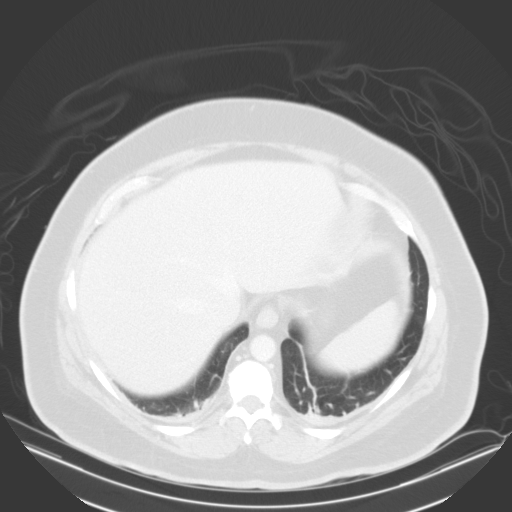
[im 82/88  soft-tissue]
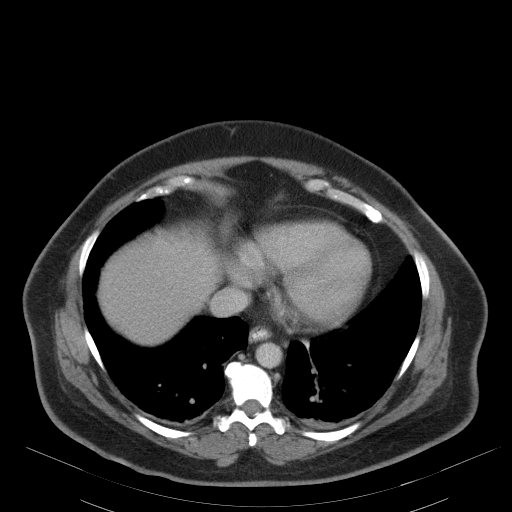
[im 82/88  lung]
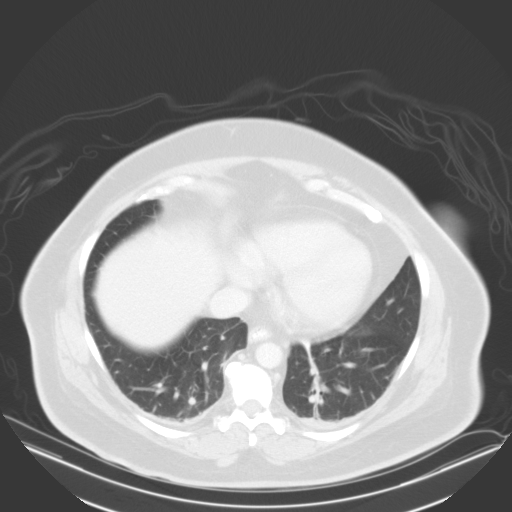

[13 of 32 positions shown; findings below may reference images not displayed]

FINDINGS: Heart size upper normal to mildly enlarged. Mild bibasilar
opacities, favored to reflect atelectasis.

Mild decreased hepatic attenuation, nonspecific post contrast
however can be seen in the setting of fatty infiltration.
Cholecystectomy. No biliary ductal dilatation. Unremarkable spleen,
pancreas, adrenal glands. 1.6 x 1.7 cm lesion arising from the lower
pole right kidney anteriorly is centrally hypovascular with
peripheral attenuation similar to that of renal parenchyma
suggesting enhancement. A couple hypodensities on the right are too
small to further characterize. No hydroureteronephrosis. Symmetric
bilateral excretion. No extravasation of excreted contrast.

No CT evidence for colitis. Appendix not identified. No right lower
quadrant inflammation. There is fluid within the right paracolic
gutter. Small bowel loops are of normal course and caliber. No free
intraperitoneal air. No lymphadenopathy. Normal caliber aorta and
branch vessels.

Thin walled bladder. Interval hysterectomy. 1 cm hyperdense focus at
the vaginal cuff is nonspecific and may reflect a small amount of
blood products. This is adjacent to a mixed fat, fluid, and air
pocket as seen on series 2, image 71. Small mild free fluid within
the pelvis. Loculated gas and ill-defined fluid along the anterior
pelvic wall. Skin staples over the anterior pelvis.

No adnexal mass.

No acute osseous finding.
IMPRESSION: 1.8 cm lower pole right renal lesion anteriorly is suspicious for a
renal neoplasm. Recommend renal MRI.

Postoperative changes of recent hysterectomy. Air-fat-fluid
collection adjacent to the vaginal cuff is favored to reflect
Gel-Foam or Surgicel. Confirm clinically. Otherwise, small amount of
intraperitoneal and abdominal wall fluid and locules of gas are
nonspecific however not unexpected in the recently postoperative
state.

Bilateral symmetric renal excretion. No extravasation of excreted
contrast to suggest a leak.

Bibasilar atelectasis.

## 2015-11-05 ENCOUNTER — Ambulatory Visit (HOSPITAL_COMMUNITY)
Admission: EM | Admit: 2015-11-05 | Discharge: 2015-11-05 | Disposition: A | Discharge status: Home / Self Care | Payer: No Typology Code available for payment source | Attending: Emergency Medicine | Admitting: Emergency Medicine

## 2015-11-05 ENCOUNTER — Encounter (HOSPITAL_COMMUNITY): Payer: Self-pay | Admitting: Emergency Medicine

## 2015-11-05 DIAGNOSIS — B9789 Other viral agents as the cause of diseases classified elsewhere: Principal | ICD-10-CM

## 2015-11-05 DIAGNOSIS — J029 Acute pharyngitis, unspecified: Secondary | ICD-10-CM

## 2015-11-05 DIAGNOSIS — H66002 Acute suppurative otitis media without spontaneous rupture of ear drum, left ear: Secondary | ICD-10-CM

## 2015-11-05 DIAGNOSIS — J069 Acute upper respiratory infection, unspecified: Secondary | ICD-10-CM

## 2015-11-05 MED ORDER — AMOXICILLIN-POT CLAVULANATE 875-125 MG PO TABS: 1.0000 | ORAL_TABLET | Freq: Two times a day (BID) | ORAL | Status: DC

## 2015-11-05 NOTE — ED Provider Notes (Signed)
CSN: [id number]     Arrival date & time 11/05/15  1337 History   First MD Initiated Contact with Patient 11/05/15 1421     Chief Complaint  Patient presents with  . Otalgia  . URI   (Consider location/radiation/quality/duration/timing/severity/associated sxs/prior Treatment) HPI  She is a 50 year old woman here for evaluation of ear pain. She states her symptoms started about a week ago when she woke up with sore throat, body aches, and fatigue. Over the next several days she developed nasal congestion and sinus pressure as well as a productive cough. She reports fevers up to 101 with the illness. Last night, she developed a left earache, which prompted her to come in. She has been taking multiple over-the-counter medications without improvement. She does report being exposed to a relative that was diagnosed with strep throat. She also states she was at a graduation and a funeral the week before symptoms started. 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  Past Medical History  Diagnosis Date  . PCOS (polycystic ovarian syndrome)   . Sleep apnea   . Blood transfusion without reported diagnosis   . Anemia   . PONV (postoperative nausea and vomiting)    Past Surgical History  Procedure Laterality Date  . Appendectomy    . Cholecystectomy    . Cesarean section      3x  . Cholecystectomy  1991  . Abdominal hysterectomy N/A 06/13/2013    Procedure: HYSTERECTOMY ABDOMINAL and removal of vaginal skin tag;  Surgeon: Luz Lex, MD;  Location: [facility];  Service: Gynecology;  Laterality: N/A;   Family History  Problem Relation Age of Onset  . Cancer Mother   . Depression Mother   . Diabetes Mother   . Arthritis Mother   . Heart disease Mother   . Learning disabilities Mother   . Mental illness Mother   . Cancer Father   . Arthritis Father   . Cancer Maternal Aunt     x 2 Aunts   Social History  Substance Use Topics  . Smoking  status: Never Smoker   . Smokeless tobacco: None  . Alcohol Use: No   OB History    Gravida Para Term Preterm AB TAB SAB Ectopic Multiple Living   4 3 3       5      Review of Systems As in history of present illness Allergies  Codeine  Home Medications   Prior to Admission medications   Medication Sig Start Date End Date Taking? Authorizing Provider  acetaminophen (TYLENOL) 500 MG tablet Take 1,000 mg by mouth every 6 (six) hours as needed for mild pain.    Historical Provider, MD  amoxicillin-clavulanate (AUGMENTIN) 875-125 MG tablet Take 1 tablet by mouth 2 (two) times daily. 11/05/15   Melony Overly, MD  Dexlansoprazole (DEXILANT PO) Take 1 capsule by mouth daily.    Historical Provider, MD  docusate sodium (COLACE) 100 MG capsule Take 100 mg by mouth 2 (two) times daily.    Historical Provider, MD  famotidine (PEPCID) 20 MG tablet Take 1 tablet (20 mg total) by mouth 2 (two) times daily. 06/18/13   Louretta Shorten, MD  HYDROmorphone (DILAUDID) 2 MG tablet Take 1 tablet (2 mg total) by mouth every 4 (four) hours as needed for moderate pain or severe pain. 06/18/13   Louretta Shorten, MD  IRON PO Take 3 tablets by mouth daily.    Historical Provider, MD  ketorolac (TORADOL) 10 MG tablet Take 1 tablet (10 mg total) by mouth every 6 (six) hours. 06/18/13   Louretta Shorten, MD  MELATONIN PO Take 1 tablet by mouth as needed (sleep).    Historical Provider, MD  metoCLOPramide (REGLAN) 10 MG tablet Take 1 tablet (10 mg total) by mouth 3 (three) times daily before meals. 06/18/13   Louretta Shorten, MD  Multiple Vitamins-Minerals (MULTIVITAMIN WITH MINERALS) tablet Take 1 tablet by mouth daily.    Historical Provider, MD  ondansetron (ZOFRAN-ODT) 8 MG disintegrating tablet Take 1 tablet (8 mg total) by mouth every 8 (eight) hours as needed for nausea or vomiting. 06/18/13   Louretta Shorten, MD   Meds Ordered and Administered this Visit  Medications - No data to display  BP 154/90 mmHg  Pulse 80  Temp(Src) 98.3 F (36.8 C)  (Oral)  Resp 16  Ht 5\' 1"  (1.549 m)  Wt 230 lb (104.327 kg)  BMI 43.48 kg/m2  SpO2 96%  LMP 03/13/2013 No data found.   Physical Exam  Constitutional: She is oriented to person, place, and time. She appears well-developed and well-nourished. No distress.  HENT:  Mouth/Throat: No oropharyngeal exudate.  Mild oropharyngeal erythema. No exudates. Nasal discharge is present. Nasal mucosa is erythematous and slightly edematous. Right TM has a clear effusion. Left TM is erythematous with a purulent appearing effusion.  Neck: Neck supple.  Cardiovascular: Normal rate, regular rhythm and normal heart sounds.   No murmur heard. Pulmonary/Chest: Effort normal and breath sounds normal. No respiratory distress. She has no wheezes. She has no rales.  Coarse sounding cough  Lymphadenopathy:    She has no cervical adenopathy.  Neurological: She is alert and oriented to person, place, and time.    ED Course  Procedures (including critical care time)  Labs Review Labs Reviewed - No data to display  Imaging Review No results found.   MDM   1. Viral URI with cough   2. Pharyngitis   3. Acute suppurative otitis media of left ear without spontaneous rupture of tympanic membrane, recurrence not specified    We'll treat with Augmentin for 10 days. Recommended daily allergy pill as well. Follow-up as needed.    Melony Overly, MD 11/05/15 [phone]

## 2015-11-05 NOTE — ED Notes (Signed)
PT reports sore throat, sinus pain, congestion, body aches, and now ear pain. PT reports similar symptoms a year ago resulted in both ear drums rupturing.

## 2015-11-05 NOTE — Discharge Instructions (Signed)
This likely started out as a viral illness, but you have developed an ear infection in the left ear. Take Augmentin twice a day for 10 days. You can continue the over-the-counter medications if they are helping. I would recommend taking a daily allergy pill such as Claritin, Zyrtec, or Allegra. Follow-up as needed.

## 2015-11-27 ENCOUNTER — Ambulatory Visit (HOSPITAL_COMMUNITY)
Admission: EM | Admit: 2015-11-27 | Discharge: 2015-11-27 | Disposition: A | Discharge status: Home / Self Care | Payer: Self-pay | Attending: Family Medicine | Admitting: Family Medicine

## 2015-11-27 ENCOUNTER — Encounter (HOSPITAL_COMMUNITY): Payer: Self-pay | Admitting: *Deleted

## 2015-11-27 DIAGNOSIS — L03311 Cellulitis of abdominal wall: Secondary | ICD-10-CM

## 2015-11-27 MED ORDER — DOXYCYCLINE HYCLATE 100 MG PO CAPS: 100.0000 mg | ORAL_CAPSULE | Freq: Two times a day (BID) | ORAL | Status: DC

## 2015-11-27 NOTE — Discharge Instructions (Signed)
Cellulitis Cellulitis is an infection of the skin and the tissue under the skin. The infected area is usually red and tender. This happens most often in the arms and lower legs. HOME CARE   Take your antibiotic medicine as told. Finish the medicine even if you start to feel better.  Keep the infected arm or leg raised (elevated).  Put a warm cloth on the area up to 4 times per day.  Only take medicines as told by your doctor.  Keep all doctor visits as told. GET HELP IF:  You see red streaks on the skin coming from the infected area.  Your red area gets bigger or turns a dark color.  Your bone or joint under the infected area is painful after the skin heals.  Your infection comes back in the same area or different area.  You have a puffy (swollen) bump in the infected area.  You have new symptoms.  You have a fever. GET HELP RIGHT AWAY IF:   You feel very sleepy.  You throw up (vomit) or have watery poop (diarrhea).  You feel sick and have muscle aches and pains.   This information is not intended to replace advice given to you by your health care provider. Make sure you discuss any questions you have with your health care provider.   Document Released: 10/21/2007 Document Revised: 01/23/2015 Document Reviewed: 07/20/2011 Elsevier Interactive Patient Education 2016 Elsevier Inc[employer] Lyme Disease Lyme disease is an infection that affects many parts of the body, including the skin, joints, and nervous system. CAUSES Lyme disease is caused by bacteria called Borrelia burgdorferi. You can get Lyme disease by being bitten by an infected tick. The tick must be attached to your skin for at least 36 hours to transmit the infection. Deer often carry infected ticks. RISK FACTORS  Living in or visiting [place] states, or the [place].  Spending time in wooded or grassy areas.  Being outdoors with exposed skin.  Failing to remove a tick from your skin  within 3-4 days. SIGNS AND SYMPTOMS  A round, red rash that comes out from the center of the tick bite. This is the first sign of infection. The center of the rash may be blood colored or have tiny blisters.  Fatigue.  Headache.  Chills and fever.  General achiness.  Joint pain, often in the knee.  Swollen lymph glands. DIAGNOSIS Lyme disease is diagnosed with a medical history, physical exam, and blood test. TREATMENT The main treatment is antibiotic medicine, usually taken by mouth. The length of treatment depends on how soon after a tick bite you begin taking the medicine. In some cases, treatment is necessary for several weeks. If the infection is severe, IV antibiotics may be necessary. HOME CARE INSTRUCTIONS  Take your antibiotic medicine as directed by your health care provider. Finish the antibiotic even if you start to feel better.  You may take a probiotic in between doses of your antibiotic to help avoid stomach upset or diarrhea.  Check with your health care provider before supplementing your treatment. Many alternative therapies have not been proven and may be harmful to you.  Keep all follow-up visits as directed by your health care provider. This is important. PREVENTION Reinfection is possible with another tick bite by an infected tick. Take these precautions to prevent an infection:  Cover your skin with light-colored clothing when outdoors in the spring and summer months.  Spray clothing and skin with bug spray.  The spray should be 20-30% DEET.  Avoided wooded, grassy, and shaded areas.  Remove yard litter, brush, trash, and plants that attract deer and rodents.  Check yourself for ticks when you come indoors.  Wash clothing worn each day.  Check your pets for ticks before they come inside.  If you find a tick:  Remove it with tweezers.  Clean your hands and the bite area with rubbing alcohol or soap and water. Pregnant women should take special  care to avoid tick bites because the infection can be passed along to the fetus. SEEK MEDICAL CARE IF:  You have symptoms after treatment.  You have removed a tick and want to bring it to your health care provider for testing. SEEK IMMEDIATE MEDICAL CARE IF:  You have an irregular heartbeat.  You have nerve pain.  Your face feels numb. MAKE SURE YOU:  Understand these instructions.  Will watch your condition.  Will get help right away if you are not doing well or get worse.   This information is not intended to replace advice given to you by your health care provider. Make sure you discuss any questions you have with your health care provider.   Document Released: 08/10/2000 Document Revised: 05/25/2014 Document Reviewed: 09/19/2013 Elsevier Interactive Patient Education [employer].

## 2015-11-27 NOTE — ED Notes (Addendum)
Pt  Reports  Headache   Body  Aches   And  Joint pain   Pt  Has  A  Bright  Red         Rash     Lower  abd     X  3  Days        Pt  Reports     Had  A   Bug  Bite  About   sev  Weeks  Ago         While   On a   Camping  Trip       Re[orts headache  As  Well

## 2015-11-27 NOTE — ED Provider Notes (Signed)
CSN: [id number]     Arrival date & time 11/27/15  1023 History   First MD Initiated Contact with Patient 11/27/15 1126     No chief complaint on file.  (Consider location/radiation/quality/duration/timing/severity/associated sxs/prior Treatment) HPI History obtained from patient:  Pt presents with the cc of:  Not feeling well Duration of symptoms: One week Treatment prior to arrival: Over-the-counter medications without good resolution of symptoms Context: Gradual onset of not feeling well and then development of a rash on her lower abdomen. She is concerned that she may have Lyme disease. She has been camping but does not admit to a tick bite of or known tick bite. She states that a friend of hers was just diagnosed with Lyme disease. Other symptoms include: Gen. malaise Pain score: No pain FAMILY HISTORY: Diabetes    Past Medical History  Diagnosis Date  . PCOS (polycystic ovarian syndrome)   . Sleep apnea   . Blood transfusion without reported diagnosis   . Anemia   . PONV (postoperative nausea and vomiting)    Past Surgical History  Procedure Laterality Date  . Appendectomy    . Cholecystectomy    . Cesarean section      3x  . Cholecystectomy  1991  . Abdominal hysterectomy N/A 06/13/2013    Procedure: HYSTERECTOMY ABDOMINAL and removal of vaginal skin tag;  Surgeon: Luz Lex, MD;  Location: [facility];  Service: Gynecology;  Laterality: N/A;   Family History  Problem Relation Age of Onset  . Cancer Mother   . Depression Mother   . Diabetes Mother   . Arthritis Mother   . Heart disease Mother   . Learning disabilities Mother   . Mental illness Mother   . Cancer Father   . Arthritis Father   . Cancer Maternal Aunt     x 2 Aunts   Social History  Substance Use Topics  . Smoking status: Never Smoker   . Smokeless tobacco: None  . Alcohol Use: No   OB History    Gravida Para Term Preterm AB TAB SAB Ectopic Multiple Living   4 3 3       5      Review of  Systems  Denies: HEADACHE, NAUSEA, ABDOMINAL PAIN, CHEST PAIN, CONGESTION, DYSURIA, SHORTNESS OF BREATH  Allergies  Codeine  Home Medications   Prior to Admission medications   Medication Sig Start Date End Date Taking? Authorizing Provider  acetaminophen (TYLENOL) 500 MG tablet Take 1,000 mg by mouth every 6 (six) hours as needed for mild pain.    Historical Provider, MD  amoxicillin-clavulanate (AUGMENTIN) 875-125 MG tablet Take 1 tablet by mouth 2 (two) times daily. 11/05/15   Melony Overly, MD  Dexlansoprazole (DEXILANT PO) Take 1 capsule by mouth daily.    Historical Provider, MD  docusate sodium (COLACE) 100 MG capsule Take 100 mg by mouth 2 (two) times daily.    Historical Provider, MD  doxycycline (VIBRAMYCIN) 100 MG capsule Take 1 capsule (100 mg total) by mouth 2 (two) times daily. 11/27/15   Konrad Felix, PA  famotidine (PEPCID) 20 MG tablet Take 1 tablet (20 mg total) by mouth 2 (two) times daily. 06/18/13   Louretta Shorten, MD  HYDROmorphone (DILAUDID) 2 MG tablet Take 1 tablet (2 mg total) by mouth every 4 (four) hours as needed for moderate pain or severe pain. 06/18/13   Louretta Shorten, MD  IRON PO Take 3 tablets by mouth daily.    Historical Provider, MD  ketorolac (  TORADOL) 10 MG tablet Take 1 tablet (10 mg total) by mouth every 6 (six) hours. 06/18/13   Louretta Shorten, MD  MELATONIN PO Take 1 tablet by mouth as needed (sleep).    Historical Provider, MD  metoCLOPramide (REGLAN) 10 MG tablet Take 1 tablet (10 mg total) by mouth 3 (three) times daily before meals. 06/18/13   Louretta Shorten, MD  Multiple Vitamins-Minerals (MULTIVITAMIN WITH MINERALS) tablet Take 1 tablet by mouth daily.    Historical Provider, MD  ondansetron (ZOFRAN-ODT) 8 MG disintegrating tablet Take 1 tablet (8 mg total) by mouth every 8 (eight) hours as needed for nausea or vomiting. 06/18/13   Louretta Shorten, MD   Meds Ordered and Administered this Visit  Medications - No data to display  BP 128/64 mmHg  Pulse 87  Temp(Src) 98.4  F (36.9 C) (Oral)  Resp 16  SpO2 97%  LMP 03/13/2013 No data found.   Physical Exam NURSES NOTES AND VITAL SIGNS REVIEWED. CONSTITUTIONAL: Well developed, well nourished, no acute distress HEENT: normocephalic, atraumatic EYES: Conjunctiva normal NECK:normal ROM, supple, no adenopathy PULMONARY:No respiratory distress, normal effort ABDOMINAL: Soft, ND, NT BS+, No CVAT abdomen is obese there is a abdominal apron noted just above the suprapubic region. And this is red with indurated tissue. No lymphangitic streaking noted. Bull's eye type lesion noted. MUSCULOSKELETAL: Normal ROM of all extremities,  SKIN: warm and dry without rash PSYCHIATRIC: Mood and affect, behavior are normal   ED Course  Procedures (including critical care time)  Labs Review Labs Reviewed - No data to display  Imaging Review No results found.   Visual Acuity Review  Right Eye Distance:   Left Eye Distance:   Bilateral Distance:    Right Eye Near:   Left Eye Near:    Bilateral Near:       Prescription for doxycycline and instructions of care only and instructions for Lyme disease are provided to the patient  MDM   1. Cellulitis of abdominal wall     Patient is reassured that there are no issues that require transfer to higher level of care at this time or additional tests. Patient is advised to continue home symptomatic treatment. Patient is advised that if there are new or worsening symptoms to attend the emergency department, contact primary care provider, or return to UC. Instructions of care provided discharged home in stable condition.    THIS NOTE WAS GENERATED USING A VOICE RECOGNITION SOFTWARE PROGRAM. ALL REASONABLE EFFORTS  WERE MADE TO PROOFREAD THIS DOCUMENT FOR ACCURACY.  I have verbally reviewed the discharge instructions with the patient. A printed AVS was given to the patient.  All questions were answered prior to discharge.      Konrad Felix, [place] 11/27/15 1151

## 2017-03-04 ENCOUNTER — Ambulatory Visit (HOSPITAL_COMMUNITY)
Admission: EM | Admit: 2017-03-04 | Discharge: 2017-03-04 | Disposition: A | Discharge status: Home / Self Care | Payer: Self-pay | Attending: Emergency Medicine | Admitting: Emergency Medicine

## 2017-03-04 ENCOUNTER — Encounter (HOSPITAL_COMMUNITY): Payer: Self-pay | Admitting: *Deleted

## 2017-03-04 DIAGNOSIS — L03311 Cellulitis of abdominal wall: Secondary | ICD-10-CM

## 2017-03-04 DIAGNOSIS — L304 Erythema intertrigo: Secondary | ICD-10-CM

## 2017-03-04 MED ORDER — MICONAZOLE NITRATE 2 % EX CREA: 1.0000 "application " | TOPICAL_CREAM | Freq: Two times a day (BID) | CUTANEOUS | 0 refills | Status: DC

## 2017-03-04 MED ORDER — CEFTRIAXONE SODIUM 1 G IJ SOLR
1.0000 g | Freq: Once | INTRAMUSCULAR | Status: AC
  Administered 2017-03-04: 1 g via INTRAMUSCULAR

## 2017-03-04 MED ORDER — CEFTRIAXONE SODIUM 1 G IJ SOLR
INTRAMUSCULAR | Status: AC
  Filled 2017-03-04: qty 10

## 2017-03-04 MED ORDER — DOXYCYCLINE HYCLATE 100 MG PO CAPS: 100.0000 mg | ORAL_CAPSULE | Freq: Two times a day (BID) | ORAL | 0 refills | Status: AC

## 2017-03-04 NOTE — ED Triage Notes (Signed)
Patient reports painful rash to lower abdomen x 1 day. Patient reports fever. No itching just pain.
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
## 2017-04-04 ENCOUNTER — Encounter (HOSPITAL_COMMUNITY): Payer: Self-pay

## 2017-04-04 ENCOUNTER — Ambulatory Visit (HOSPITAL_COMMUNITY)
Admission: EM | Admit: 2017-04-04 | Discharge: 2017-04-04 | Disposition: A | Discharge status: Home / Self Care | Payer: Self-pay | Attending: Family Medicine | Admitting: Family Medicine

## 2017-04-04 ENCOUNTER — Other Ambulatory Visit: Payer: Self-pay

## 2017-04-04 DIAGNOSIS — B372 Candidiasis of skin and nail: Secondary | ICD-10-CM

## 2017-04-04 DIAGNOSIS — L03311 Cellulitis of abdominal wall: Secondary | ICD-10-CM

## 2017-04-04 MED ORDER — AMOXICILLIN-POT CLAVULANATE 875-125 MG PO TABS: 1.0000 | ORAL_TABLET | Freq: Two times a day (BID) | ORAL | 0 refills | Status: DC

## 2017-04-04 MED ORDER — LIDOCAINE HCL (PF) 1 % IJ SOLN
INTRAMUSCULAR | Status: AC
  Filled 2017-04-04: qty 2

## 2017-04-04 MED ORDER — CEFTRIAXONE SODIUM 1 G IJ SOLR
INTRAMUSCULAR | Status: AC
  Filled 2017-04-04: qty 10

## 2017-04-04 MED ORDER — CEFTRIAXONE SODIUM 1 G IJ SOLR
1.0000 g | Freq: Once | INTRAMUSCULAR | Status: AC
  Administered 2017-04-04: 1 g via INTRAMUSCULAR

## 2017-04-04 MED ORDER — CEFTRIAXONE SODIUM 1 G IJ SOLR: 1.0000 g | Freq: Once | INTRAMUSCULAR | Status: DC

## 2017-04-04 NOTE — Discharge Instructions (Signed)
Take the Augmentin as directed for cellulitis of the abdomen. Use Lotrimin or Lamisil cream to the areas of yeast infection. Keep these area dry. Use a hair dryer after showering. May need to use these for 2 weeks or more. For worsening, spreading of the infection of the abdomen, fevers or other problems seek medical attention promptly.

## 2017-04-04 NOTE — ED Provider Notes (Signed)
[place]    CSN: [id number] Arrival date & time: 04/04/17  1247     History   Chief Complaint Chief Complaint  Patient presents with  . Chills  . Rash    HPI Teresa Camacho is a 51 y.o. female.   51 year old female states yesterday she developed a hot painful than her rash over the lower abdomen. Most of erythema is below the umbilicus. She states she is not sure why she developed a cellulitis but had a similar episode a little over a month ago. She also has an itchy red rash in the intertriginous areas beneath the breast and in the groin which is different from the anterior abdominal rash. She feels like she may have had a fever at home and occasionally chills and started as well. Some body aches.      Past Medical History:  Diagnosis Date  . Anemia   . Blood transfusion without reported diagnosis   . PCOS (polycystic ovarian syndrome)   . PONV (postoperative nausea and vomiting)   . Sleep apnea     Patient Active Problem List   Diagnosis Date Noted  . Iron deficiency anemia due to chronic blood loss 06/28/2013  . S/P hysterectomy 06/13/2013  . Dysfunctional uterine bleeding 05/25/2013  . Fibroids, submucosal 05/03/2013  . Abnormal uterine bleeding 04/17/2013    Past Surgical History:  Procedure Laterality Date  . APPENDECTOMY    . CESAREAN SECTION     3x  . CHOLECYSTECTOMY    . CHOLECYSTECTOMY  1991  . HYSTERECTOMY ABDOMINAL and removal of vaginal skin tag N/A 06/13/2013   Performed by Luz Lex, MD at [facility]    OB History    Gravida Para Term Preterm AB Living   4 3 3     5    SAB TAB Ectopic Multiple Live Births                   Home Medications    Prior to Admission medications   Medication Sig Start Date End Date Taking? Authorizing Provider  MELATONIN PO Take 1 tablet by mouth as needed (sleep).   Yes [provider]  acetaminophen (TYLENOL) 500 MG tablet Take 1,000 mg by mouth every 6 (six) hours as needed for mild  pain.    [provider]  amoxicillin-clavulanate (AUGMENTIN) 875-125 MG tablet Take 1 tablet every 12 (twelve) hours by mouth. 04/04/17   Lexis Potenza, Shanon Brow, NP  Dexlansoprazole (DEXILANT PO) Take 1 capsule by mouth daily.    [provider]  docusate sodium (COLACE) 100 MG capsule Take 100 mg by mouth 2 (two) times daily.    [provider]  famotidine (PEPCID) 20 MG tablet Take 1 tablet (20 mg total) by mouth 2 (two) times daily. 06/18/13   Louretta Shorten, MD  HYDROmorphone (DILAUDID) 2 MG tablet Take 1 tablet (2 mg total) by mouth every 4 (four) hours as needed for moderate pain or severe pain. 06/18/13   Louretta Shorten, MD  IRON PO Take 3 tablets by mouth daily.    [provider]  ketorolac (TORADOL) 10 MG tablet Take 1 tablet (10 mg total) by mouth every 6 (six) hours. 06/18/13   Louretta Shorten, MD  metoCLOPramide (REGLAN) 10 MG tablet Take 1 tablet (10 mg total) by mouth 3 (three) times daily before meals. 06/18/13   Louretta Shorten, MD  miconazole (MICOTIN) 2 % cream Apply 1 application topically 2 (two) times daily. 03/04/17   Augusto Gamble  B, NP  Multiple Vitamins-Minerals (MULTIVITAMIN WITH MINERALS) tablet Take 1 tablet by mouth daily.    [provider]  ondansetron (ZOFRAN-ODT) 8 MG disintegrating tablet Take 1 tablet (8 mg total) by mouth every 8 (eight) hours as needed for nausea or vomiting. 06/18/13   Louretta Shorten, MD    Family History Family History  Problem Relation Age of Onset  . Cancer Mother   . Depression Mother   . Diabetes Mother   . Arthritis Mother   . Heart disease Mother   . Learning disabilities Mother   . Mental illness Mother   . Cancer Father   . Arthritis Father   . Cancer Maternal Aunt        x 2 Aunts    Social History Social History   Tobacco Use  . Smoking status: Never Smoker  . Smokeless tobacco: Never Used  Substance Use Topics  . Alcohol use: No  . Drug use: No     Allergies   Codeine   Review of Systems Review  of Systems  Constitutional: Positive for activity change.       Undocumented fever and malaise  HENT: Negative.   Respiratory: Negative.   Gastrointestinal: Negative.   Genitourinary: Negative.   Skin: Positive for rash.  Neurological: Negative.   All other systems reviewed and are negative.    Physical Exam Triage Vital Signs ED Triage Vitals  Enc Vitals Group     BP 04/04/17 1351 (!) 143/86     Pulse Rate 04/04/17 1351 84     Resp 04/04/17 1351 16     Temp 04/04/17 1351 98.3 F (36.8 C)     Temp Source 04/04/17 1351 Oral     SpO2 04/04/17 1351 99 %     Weight --      Height --      Head Circumference --      Peak Flow --      Pain Score 04/04/17 1352 4     Pain Loc --      Pain Edu? --      Excl. in [place]? --    No data found.  Updated Vital Signs BP (!) 143/86 (BP Location: Left Arm)   Pulse 84   Temp 98.3 F (36.8 C) (Oral)   Resp 16   LMP 04/17/2013   SpO2 99%   Visual Acuity Right Eye Distance:   Left Eye Distance:   Bilateral Distance:    Right Eye Near:   Left Eye Near:    Bilateral Near:     Physical Exam  Constitutional: She is oriented to person, place, and time. She appears well-developed and well-nourished. No distress.  Eyes: EOM are normal.  Neck: Normal range of motion. Neck supple.  Cardiovascular: Normal rate.  Pulmonary/Chest: Effort normal. No respiratory distress.  Musculoskeletal: She exhibits no edema.  Neurological: She is alert and oriented to person, place, and time. She exhibits normal muscle tone.  Skin: Skin is warm and dry.  Abdomen and is obese. Relatively large area of erythema with indurated dermis to the lower abdomen inferior to the umbilicus. It extends toward the mid suprapubic area. No palpable mass or abscess. Skin is thickened almost lichenified. No drainage. No lymphangitis. Mildly tender.  The rash in the intertriginous areas of the breast and groin are erythematous papules and patches different in appearance of  cellulitis. Typical for candidal intertrigo.  Psychiatric: She has a normal mood and affect.  Nursing note and vitals reviewed.  UC Treatments / Results  Labs (all labs ordered are listed, but only abnormal results are displayed) Labs Reviewed - No data to display  EKG  EKG Interpretation None       Radiology No results found.  Procedures Procedures (including critical care time)  Medications Ordered in UC Medications  cefTRIAXone (ROCEPHIN) injection 1 g (not administered)     Initial Impression / Assessment and Plan / UC Course  I have reviewed the triage vital signs and the nursing notes.  Pertinent labs & imaging results that were available during my care of the patient were reviewed by me and considered in my medical decision making (see chart for details).    Take the Augmentin as directed for cellulitis of the abdomen. Use Lotrimin or Lamisil cream to the areas of yeast infection. Keep these area dry. Use a hair dryer after showering. May need to use these for 2 weeks or more. For worsening, spreading of the infection of the abdomen, fevers or other problems seek medical attention promptly.    Final Clinical Impressions(s) / UC Diagnoses   Final diagnoses:  Cellulitis of abdominal wall  [place] intertrigo    ED Discharge Orders        Ordered    amoxicillin-clavulanate (AUGMENTIN) 875-125 MG tablet  Every 12 hours     04/04/17 1412       Controlled Substance Prescriptions [place] Controlled Substance Registry consulted? Not Applicable   Janne Napoleon, NP 04/04/17 1418

## 2017-04-04 NOTE — ED Triage Notes (Signed)
Patient presents to [facility] for chills, nausea, generalized body aches, and painful rash on lower abdomen x2 days, patient reports fever. Pt has taken OTC medications to treat cold symptom has no relief

## 2017-04-14 ENCOUNTER — Ambulatory Visit (INDEPENDENT_AMBULATORY_CARE_PROVIDER_SITE_OTHER): Payer: Self-pay | Admitting: *Deleted

## 2017-04-14 DIAGNOSIS — Z23 Encounter for immunization: Secondary | ICD-10-CM

## 2019-04-11 ENCOUNTER — Other Ambulatory Visit: Payer: Self-pay

## 2019-04-11 DIAGNOSIS — Z20822 Contact with and (suspected) exposure to covid-19: Secondary | ICD-10-CM

## 2019-04-12 LAB — NOVEL CORONAVIRUS, NAA: SARS-CoV-2, NAA: NOT DETECTED

## 2019-07-05 ENCOUNTER — Ambulatory Visit: Payer: Self-pay

## 2019-08-30 ENCOUNTER — Other Ambulatory Visit: Payer: Self-pay | Admitting: Family Medicine

## 2019-08-30 ENCOUNTER — Ambulatory Visit
Admission: RE | Admit: 2019-08-30 | Discharge: 2019-08-30 | Disposition: A | Discharge status: Home / Self Care | Payer: No Typology Code available for payment source | Source: Ambulatory Visit | Attending: Family Medicine | Admitting: Family Medicine

## 2019-08-30 ENCOUNTER — Other Ambulatory Visit: Payer: Self-pay

## 2019-08-30 DIAGNOSIS — M79671 Pain in right foot: Secondary | ICD-10-CM

## 2019-08-30 NOTE — Progress Notes (Signed)
7 days ago dropped a heavy piece of wood on R foot. Elevation and ice and NSAIDs w/o much benefit. Limited mobility initially in the hopes that the swelling and pain would improve. Pt feels the pain has worsened. Pain is worse along the dorsum of the foot. The bruising was significant. Unable to move 2-4th toes secondary to the pain. Purchased a Cam walker boot w/ some improvement in mobility

## 2019-11-30 ENCOUNTER — Emergency Department (HOSPITAL_COMMUNITY): Payer: Self-pay

## 2019-11-30 ENCOUNTER — Other Ambulatory Visit: Payer: Self-pay

## 2019-11-30 ENCOUNTER — Inpatient Hospital Stay (HOSPITAL_COMMUNITY)
Admission: EM | Admit: 2019-11-30 | Discharge: 2019-12-02 | DRG: 305 | Disposition: A | Discharge status: Home / Self Care | Payer: Self-pay | Attending: Internal Medicine | Admitting: Internal Medicine

## 2019-11-30 ENCOUNTER — Ambulatory Visit (HOSPITAL_COMMUNITY): Payer: Self-pay

## 2019-11-30 ENCOUNTER — Encounter (HOSPITAL_COMMUNITY): Payer: Self-pay

## 2019-11-30 ENCOUNTER — Ambulatory Visit (HOSPITAL_COMMUNITY)
Admission: RE | Admit: 2019-11-30 | Discharge: 2019-11-30 | Disposition: A | Discharge status: Home / Self Care | Payer: Self-pay | Source: Ambulatory Visit

## 2019-11-30 VITALS — BP 175/89 | HR 118 | Temp 99.2°F | Resp 18

## 2019-11-30 DIAGNOSIS — Z833 Family history of diabetes mellitus: Secondary | ICD-10-CM

## 2019-11-30 DIAGNOSIS — G4733 Obstructive sleep apnea (adult) (pediatric): Secondary | ICD-10-CM | POA: Diagnosis present

## 2019-11-30 DIAGNOSIS — M546 Pain in thoracic spine: Secondary | ICD-10-CM

## 2019-11-30 DIAGNOSIS — D751 Secondary polycythemia: Secondary | ICD-10-CM | POA: Diagnosis present

## 2019-11-30 DIAGNOSIS — Z79899 Other long term (current) drug therapy: Secondary | ICD-10-CM

## 2019-11-30 DIAGNOSIS — E1165 Type 2 diabetes mellitus with hyperglycemia: Secondary | ICD-10-CM

## 2019-11-30 DIAGNOSIS — Z8261 Family history of arthritis: Secondary | ICD-10-CM

## 2019-11-30 DIAGNOSIS — R000 Tachycardia, unspecified: Secondary | ICD-10-CM | POA: Diagnosis present

## 2019-11-30 DIAGNOSIS — Z818 Family history of other mental and behavioral disorders: Secondary | ICD-10-CM

## 2019-11-30 DIAGNOSIS — I160 Hypertensive urgency: Principal | ICD-10-CM | POA: Diagnosis present

## 2019-11-30 DIAGNOSIS — M549 Dorsalgia, unspecified: Secondary | ICD-10-CM

## 2019-11-30 DIAGNOSIS — E282 Polycystic ovarian syndrome: Secondary | ICD-10-CM | POA: Diagnosis present

## 2019-11-30 DIAGNOSIS — R9431 Abnormal electrocardiogram [ECG] [EKG]: Secondary | ICD-10-CM

## 2019-11-30 DIAGNOSIS — Z809 Family history of malignant neoplasm, unspecified: Secondary | ICD-10-CM

## 2019-11-30 DIAGNOSIS — Z885 Allergy status to narcotic agent status: Secondary | ICD-10-CM

## 2019-11-30 DIAGNOSIS — E119 Type 2 diabetes mellitus without complications: Secondary | ICD-10-CM

## 2019-11-30 DIAGNOSIS — I10 Essential (primary) hypertension: Secondary | ICD-10-CM | POA: Diagnosis present

## 2019-11-30 DIAGNOSIS — R609 Edema, unspecified: Secondary | ICD-10-CM

## 2019-11-30 DIAGNOSIS — R0789 Other chest pain: Secondary | ICD-10-CM

## 2019-11-30 DIAGNOSIS — K219 Gastro-esophageal reflux disease without esophagitis: Secondary | ICD-10-CM | POA: Diagnosis present

## 2019-11-30 DIAGNOSIS — Z20822 Contact with and (suspected) exposure to covid-19: Secondary | ICD-10-CM | POA: Diagnosis present

## 2019-11-30 DIAGNOSIS — Z794 Long term (current) use of insulin: Secondary | ICD-10-CM

## 2019-11-30 DIAGNOSIS — I249 Acute ischemic heart disease, unspecified: Secondary | ICD-10-CM

## 2019-11-30 DIAGNOSIS — R079 Chest pain, unspecified: Secondary | ICD-10-CM | POA: Diagnosis present

## 2019-11-30 DIAGNOSIS — Z8249 Family history of ischemic heart disease and other diseases of the circulatory system: Secondary | ICD-10-CM

## 2019-11-30 DIAGNOSIS — E781 Pure hyperglyceridemia: Secondary | ICD-10-CM | POA: Diagnosis present

## 2019-11-30 DIAGNOSIS — Z7982 Long term (current) use of aspirin: Secondary | ICD-10-CM

## 2019-11-30 LAB — BASIC METABOLIC PANEL
Anion gap: 16 — ABNORMAL HIGH (ref 5–15)
BUN: 11 mg/dL (ref 6–20)
CO2: 19 mmol/L — ABNORMAL LOW (ref 22–32)
Calcium: 9.1 mg/dL (ref 8.9–10.3)
Chloride: 101 mmol/L (ref 98–111)
Creatinine, Ser: 0.56 mg/dL (ref 0.44–1.00)
GFR calc Af Amer: >60 mL/min (ref >60)
GFR calc non Af Amer: >60 mL/min (ref >60)
Glucose, Bld: 306 mg/dL — ABNORMAL HIGH (ref 70–99)
Potassium: 3.7 mmol/L (ref 3.5–5.1)
Sodium: 136 mmol/L (ref 135–145)

## 2019-11-30 LAB — CBC
HCT: 46.2 % — ABNORMAL HIGH (ref 36.0–46.0)
Hemoglobin: 15.2 g/dL — ABNORMAL HIGH (ref 12.0–15.0)
MCH: 28.9 pg (ref 26.0–34.0)
MCHC: 32.9 g/dL (ref 30.0–36.0)
MCV: 87.8 fL (ref 80.0–100.0)
Platelets: 257 10*3/uL (ref 150–400)
RBC: 5.26 MIL/uL — ABNORMAL HIGH (ref 3.87–5.11)
RDW: 13.4 % (ref 11.5–15.5)
WBC: 8.5 10*3/uL (ref 4.0–10.5)
nRBC: 0 % (ref 0.0–0.2)

## 2019-11-30 LAB — BRAIN NATRIURETIC PEPTIDE: B Natriuretic Peptide: 57.5 pg/mL (ref 0.0–100.0)

## 2019-11-30 LAB — MAGNESIUM: Magnesium: 1.8 mg/dL (ref 1.7–2.4)

## 2019-11-30 LAB — TROPONIN I (HIGH SENSITIVITY)
Troponin I (High Sensitivity): 7 ng/L (ref <18)
Troponin I (High Sensitivity): 9 ng/L (ref <18)

## 2019-11-30 LAB — CBG MONITORING, ED: Glucose-Capillary: 316 mg/dL — ABNORMAL HIGH (ref 70–99)

## 2019-11-30 LAB — TSH: TSH: 2.329 u[IU]/mL (ref 0.350–4.500)

## 2019-11-30 MED ORDER — HYDROCODONE-ACETAMINOPHEN 5-325 MG PO TABS
1.0000 | ORAL_TABLET | Freq: Four times a day (QID) | ORAL | Status: DC | PRN
  Filled 2019-11-30: qty 1

## 2019-11-30 MED ORDER — INSULIN ASPART 100 UNIT/ML ~~LOC~~ SOLN
0.0000 [IU] | Freq: Three times a day (TID) | SUBCUTANEOUS | Status: DC
  Administered 2019-12-01: 7 [IU] via SUBCUTANEOUS
  Administered 2019-12-01 (×2): 5 [IU] via SUBCUTANEOUS
  Administered 2019-12-02: 3 [IU] via SUBCUTANEOUS
  Administered 2019-12-02: 7 [IU] via SUBCUTANEOUS

## 2019-11-30 MED ORDER — ASPIRIN 81 MG PO CHEW
CHEWABLE_TABLET | ORAL | Status: AC
  Filled 2019-11-30: qty 4

## 2019-11-30 MED ORDER — FAMOTIDINE IN NACL 20-0.9 MG/50ML-% IV SOLN
20.0000 mg | Freq: Two times a day (BID) | INTRAVENOUS | Status: DC
  Administered 2019-12-01 (×3): 20 mg via INTRAVENOUS
  Filled 2019-11-30 (×4): qty 50

## 2019-11-30 MED ORDER — INSULIN ASPART 100 UNIT/ML ~~LOC~~ SOLN
0.0000 [IU] | Freq: Every day | SUBCUTANEOUS | Status: DC
  Administered 2019-12-01: 4 [IU] via SUBCUTANEOUS
  Administered 2019-12-01: 3 [IU] via SUBCUTANEOUS

## 2019-11-30 MED ORDER — ASPIRIN EC 81 MG PO TBEC
81.0000 mg | DELAYED_RELEASE_TABLET | Freq: Every day | ORAL | Status: DC
  Administered 2019-12-01 – 2019-12-02 (×2): 81 mg via ORAL
  Filled 2019-11-30 (×2): qty 1

## 2019-11-30 MED ORDER — ASPIRIN 81 MG PO CHEW
324.0000 mg | CHEWABLE_TABLET | Freq: Once | ORAL | Status: AC
  Administered 2019-11-30: 324 mg via ORAL

## 2019-11-30 MED ORDER — ONDANSETRON HCL 4 MG/2ML IJ SOLN
4.0000 mg | Freq: Four times a day (QID) | INTRAMUSCULAR | Status: DC | PRN
  Administered 2019-12-01 – 2019-12-02 (×2): 4 mg via INTRAVENOUS
  Filled 2019-11-30 (×2): qty 2

## 2019-11-30 MED ORDER — IOHEXOL 350 MG/ML SOLN
75.0000 mL | Freq: Once | INTRAVENOUS | Status: AC | PRN
  Administered 2019-11-30: 75 mL via INTRAVENOUS

## 2019-11-30 MED ORDER — LISINOPRIL 10 MG PO TABS
10.0000 mg | ORAL_TABLET | Freq: Every day | ORAL | Status: DC
  Administered 2019-12-01 – 2019-12-02 (×3): 10 mg via ORAL
  Filled 2019-11-30 (×3): qty 1

## 2019-11-30 MED ORDER — METOPROLOL TARTRATE 25 MG PO TABS: 25.0000 mg | ORAL_TABLET | Freq: Two times a day (BID) | ORAL | Status: DC

## 2019-11-30 MED ORDER — ALUM & MAG HYDROXIDE-SIMETH 200-200-20 MG/5ML PO SUSP
30.0000 mL | Freq: Once | ORAL | Status: AC
  Administered 2019-12-01: 30 mL via ORAL
  Filled 2019-11-30: qty 30

## 2019-11-30 MED ORDER — DIPHENHYDRAMINE HCL 25 MG PO CAPS
25.0000 mg | ORAL_CAPSULE | Freq: Once | ORAL | Status: AC
  Administered 2019-11-30: 25 mg via ORAL
  Filled 2019-11-30: qty 1

## 2019-11-30 MED ORDER — ENOXAPARIN SODIUM 40 MG/0.4ML ~~LOC~~ SOLN
40.0000 mg | Freq: Every day | SUBCUTANEOUS | Status: DC
  Administered 2019-12-01 – 2019-12-02 (×2): 40 mg via SUBCUTANEOUS
  Filled 2019-11-30 (×2): qty 0.4

## 2019-11-30 MED ORDER — LIDOCAINE VISCOUS HCL 2 % MT SOLN
15.0000 mL | Freq: Once | OROMUCOSAL | Status: AC
  Administered 2019-12-01: 15 mL via ORAL
  Filled 2019-11-30: qty 15

## 2019-11-30 MED ORDER — ZOLPIDEM TARTRATE 5 MG PO TABS
5.0000 mg | ORAL_TABLET | Freq: Every evening | ORAL | Status: DC | PRN
  Administered 2019-12-01 (×2): 5 mg via ORAL
  Filled 2019-11-30 (×2): qty 1

## 2019-11-30 MED ORDER — ACETAMINOPHEN 325 MG PO TABS
650.0000 mg | ORAL_TABLET | ORAL | Status: DC | PRN
  Administered 2019-12-01 – 2019-12-02 (×3): 650 mg via ORAL
  Filled 2019-11-30 (×4): qty 2

## 2019-11-30 MED ORDER — LABETALOL HCL 5 MG/ML IV SOLN
10.0000 mg | INTRAVENOUS | Status: DC | PRN
  Administered 2019-12-01: 10 mg via INTRAVENOUS
  Filled 2019-11-30: qty 4

## 2019-11-30 MED ORDER — NITROGLYCERIN 0.4 MG SL SUBL
0.4000 mg | SUBLINGUAL_TABLET | SUBLINGUAL | Status: DC | PRN
  Filled 2019-11-30: qty 1

## 2019-11-30 NOTE — H&P (Addendum)
History and Physical    Teresa Camacho OBS:[id number] DOB: Mar 10, 1966 DOA: 11/30/2019  PCP: Patient, No Pcp Per   Patient coming from: Home   Chief Complaint: Discomfort in upper back   HPI: Teresa Camacho is a 54 y.o. female with medical history significant for type 2 diabetes mellitus, OSA on CPAP, and acid reflux, now presenting to the emergency department for evaluation of discomfort in her upper back.  Patient reports 2 to 4 weeks of vague discomfort involving her upper back between the shoulder blades with no inciting event or trauma.  Pain is worse when she lays flat on her back, slightly improved with Pepcid, but with no other alleviating factors identified.  She also acknowledge some discomfort in the central chest that coincides with the back pain.  There has not been any shortness of breath, cough, or orthopnea associated with this.  She reports intermittent swelling of the bilateral lower extremities without tenderness or discoloration.  No recent fevers or chills.  She previously was taking Metformin and some other prescription medications but has not had a PCP or any medications in several months.  ED Course: Upon arrival to the ED, patient is found to be afebrile, saturating well on room air, tachycardic in the 110s, and with blood pressure 175/100.  EKG features sinus tachycardia with rate 122 and nonspecific ST-T abnormality.  CTA chest is negative for PE or other acute abnormality.  Chemistry panel is notable for glucose of 306.  CBC with mild polycythemia.  TSH is normal.  High-sensitivity troponin is normal x2.  BNP is normal.  Hospitalist asked to admit.  Review of Systems:  All other systems reviewed and apart from HPI, are negative.  Past Medical History:  Diagnosis Date  . Anemia   . Blood transfusion without reported diagnosis   . PCOS (polycystic ovarian syndrome)   . PONV (postoperative nausea and vomiting)   . Sleep apnea     Past Surgical History:  Procedure  Laterality Date  . ABDOMINAL HYSTERECTOMY N/A 06/13/2013   Procedure: HYSTERECTOMY ABDOMINAL and removal of vaginal skin tag;  Surgeon: Luz Lex, MD;  Location: [facility];  Service: Gynecology;  Laterality: N/A;  . APPENDECTOMY    . CESAREAN SECTION     3x  . CHOLECYSTECTOMY    . CHOLECYSTECTOMY  1991     reports that she has never smoked. She has never used smokeless tobacco. She reports that she does not drink alcohol and does not use drugs.  Allergies  Allergen Reactions  . Codeine Nausea And Vomiting    Pt is able to take percocet & vicodin    Family History  Problem Relation Age of Onset  . Cancer Mother   . Depression Mother   . Diabetes Mother   . Arthritis Mother   . Heart disease Mother   . Learning disabilities Mother   . Mental illness Mother   . Cancer Father   . Arthritis Father   . Cancer Maternal Aunt        x 2 Aunts     Prior to Admission medications   Medication Sig Start Date End Date Taking? Authorizing Provider  acetaminophen (TYLENOL) 500 MG tablet Take 1,000 mg by mouth every 6 (six) hours as needed for mild pain.   Yes [provider]  aspirin EC 81 MG tablet Take 81 mg by mouth daily. Swallow whole.   Yes [provider]  Coenzyme Q10 (COQ10 PO) Take 1 capsule by mouth  every evening.   Yes [provider]  famotidine (PEPCID) 20 MG tablet Take 1 tablet (20 mg total) by mouth 2 (two) times daily. Patient taking differently: Take 20 mg by mouth daily as needed for heartburn.  06/18/13  Yes Louretta Shorten, MD  loratadine (CLARITIN) 10 MG tablet Take 10 mg by mouth daily.   Yes [provider]  MELATONIN PO Take 5 tablets by mouth daily as needed (sleep).    Yes [provider]  Multiple Vitamins-Minerals (MULTIVITAMIN WITH MINERALS) tablet Take 1 tablet by mouth daily.   Yes [provider]  naproxen sodium (ALEVE) 220 MG tablet Take 220 mg by mouth every evening.   Yes [provider]    VITAMIN D PO Take 1 tablet by mouth daily.   Yes [provider]  amoxicillin-clavulanate (AUGMENTIN) 875-125 MG tablet Take 1 tablet every 12 (twelve) hours by mouth. Patient not taking: Reported on 11/30/2019 04/04/17   Janne Napoleon, NP  HYDROmorphone (DILAUDID) 2 MG tablet Take 1 tablet (2 mg total) by mouth every 4 (four) hours as needed for moderate pain or severe pain. Patient not taking: Reported on 11/30/2019 06/18/13   Louretta Shorten, MD  ketorolac (TORADOL) 10 MG tablet Take 1 tablet (10 mg total) by mouth every 6 (six) hours. Patient not taking: Reported on 11/30/2019 06/18/13   Louretta Shorten, MD  metoCLOPramide (REGLAN) 10 MG tablet Take 1 tablet (10 mg total) by mouth 3 (three) times daily before meals. Patient not taking: Reported on 11/30/2019 06/18/13   Louretta Shorten, MD  miconazole (MICOTIN) 2 % cream Apply 1 application topically 2 (two) times daily. Patient not taking: Reported on 11/30/2019 03/04/17   Augusto Gamble B, NP  ondansetron (ZOFRAN-ODT) 8 MG disintegrating tablet Take 1 tablet (8 mg total) by mouth every 8 (eight) hours as needed for nausea or vomiting. Patient not taking: Reported on 11/30/2019 06/18/13   Louretta Shorten, MD    Physical Exam: Vitals:   11/30/19 1932  BP: (!) 177/102  Pulse: (!) 113  Resp: 16  SpO2: 95%    Constitutional: NAD, calm  Eyes: PERTLA, lids and conjunctivae normal ENMT: Mucous membranes are moist. Posterior pharynx clear of any exudate or lesions.   Neck: normal, supple, no masses, no thyromegaly Respiratory: no wheezing, no crackles. No accessory muscle use.  Cardiovascular: S1 & S2 heard, regular rate and rhythm. Trace lower leg swelling bilaterally.   Abdomen: No distension, no tenderness, soft. Bowel sounds active.  Musculoskeletal: no clubbing / cyanosis. No joint deformity upper and lower extremities.   Skin: no significant rashes, lesions, ulcers. Warm, dry, well-perfused. Neurologic: CN 2-12 grossly intact. Sensation intact, DTR  normal. Strength 5/5 in all 4 limbs.  Psychiatric: Alert and oriented to person, place, and situation. Pleasant and cooperative.    Labs and Imaging on Admission: I have personally reviewed following labs and imaging studies  CBC: Recent Labs  Lab 11/30/19 1845  WBC 8.5  HGB 15.2*  HCT 46.2*  MCV 87.8  PLT 366   Basic Metabolic Panel: Recent Labs  Lab 11/30/19 1845 11/30/19 2143  NA 136  --   K 3.7  --   CL 101  --   CO2 19*  --   GLUCOSE 306*  --   BUN 11  --   CREATININE 0.56  --   CALCIUM 9.1  --   MG  --  1.8   GFR: CrCl cannot be calculated (Unknown ideal weight.). Liver Function Tests: No results  for input(s): AST, ALT, ALKPHOS, BILITOT, PROT, ALBUMIN in the last 168 hours. No results for input(s): LIPASE, AMYLASE in the last 168 hours. No results for input(s): AMMONIA in the last 168 hours. Coagulation Profile: No results for input(s): INR, PROTIME in the last 168 hours. Cardiac Enzymes: No results for input(s): CKTOTAL, CKMB, CKMBINDEX, TROPONINI in the last 168 hours. BNP (last 3 results) No results for input(s): PROBNP in the last 8760 hours. HbA1C: No results for input(s): HGBA1C in the last 72 hours. CBG: Recent Labs  Lab 11/30/19 1738  GLUCAP 316*   Lipid Profile: No results for input(s): CHOL, HDL, LDLCALC, TRIG, CHOLHDL, LDLDIRECT in the last 72 hours. Thyroid Function Tests: Recent Labs    11/30/19 2143  TSH 2.329   Anemia Panel: No results for input(s): VITAMINB12, FOLATE, FERRITIN, TIBC, IRON, RETICCTPCT in the last 72 hours. Urine analysis:    Component Value Date/Time   COLORURINE RED (A) 06/04/2013 1410   APPEARANCEUR TURBID (A) 06/04/2013 1410   LABSPEC >1.030 (H) 06/04/2013 1410   PHURINE 5.0 06/04/2013 1410   GLUCOSEU NEGATIVE 06/04/2013 1410   HGBUR LARGE (A) 06/04/2013 1410   BILIRUBINUR NEGATIVE 06/04/2013 1410   KETONESUR 40 (A) 06/04/2013 1410   PROTEINUR 100 (A) 06/04/2013 1410   UROBILINOGEN 1.0 06/04/2013 1410    NITRITE POSITIVE (A) 06/04/2013 1410   LEUKOCYTESUR NEGATIVE 06/04/2013 1410   Sepsis Labs: @LABRCNTIP (procalcitonin:4,lacticidven:4) )No results found for this or any previous visit (from the past 240 hour(s)).   Radiological Exams on Admission: CT Angio Chest PE W and/or Wo Contrast  Result Date: 11/30/2019 CLINICAL DATA:  Chest pain EXAM: CT ANGIOGRAPHY CHEST WITH CONTRAST TECHNIQUE: Multidetector CT imaging of the chest was performed using the standard protocol during bolus administration of intravenous contrast. Multiplanar CT image reconstructions and MIPs were obtained to evaluate the vascular anatomy. CONTRAST:  53mL OMNIPAQUE IOHEXOL 350 MG/ML SOLN COMPARISON:  None. FINDINGS: Cardiovascular: This is a technically adequate evaluation of the pulmonary vasculature. There are no filling defects or pulmonary emboli. The heart is unremarkable without pericardial effusion. Normal caliber of the thoracic aorta. Mediastinum/Nodes: No enlarged mediastinal, hilar, or axillary lymph nodes. Thyroid gland, trachea, and esophagus demonstrate no significant findings. Lungs/Pleura: No airspace disease, effusion, or pneumothorax. Central airways are widely patent. Upper Abdomen: No acute abnormality. Musculoskeletal: No acute or destructive bony lesions. Reconstructed images demonstrate no additional findings. Review of the MIP images confirms the above findings. IMPRESSION: 1. No evidence of pulmonary embolus. 2. No acute intrathoracic process. Electronically Signed   By: Randa Ngo M.D.   On: 11/30/2019 20:07    EKG: Independently reviewed. Sinus tachycardia (rate 122), non-specific ST-T abnormality.   Assessment/Plan   1. Back pain, chest pain  - Presents with ~1 month of non-traumatic pain in upper back and more recent chest discomfort that has improved slightly with Pepcid at home and which she suspects is related to acid reflux  - There are non-specific ST-T abnormalities on EKG, normal serial  troponin, normal BNP, and no acute findings on CTA chest  - Continue cardiac monitoring, check A1c and fasting lipids, trial GI cocktail, continue Pepcid, trial NTG if no response to GI cocktail, control BP    2. Hypertension   - Does not have PCP and not taking any medications recently  - BP 175/100 range in ED  - Treat pain, start lisinopril, monitor   3. Type II DM  - No recent A1c, serum glucose 306 in ED  - Check CBGs, use  SSI for now    4. OSA  - Continue CPAP qHS    DVT prophylaxis: Lovenox  Code Status: Full  Family Communication: Sister updated at bedside Disposition Plan:  Patient is from: Home  Anticipated d/c is to: Home  Anticipated d/c date is: 12/01/19 Patient currently: Pending BP-control and pain-control, needs PCP for follow-up and medication refills  Consults called: None  Admission status: Observation     Vianne Bulls, MD [facility]  11/30/2019, 11:35 PM

## 2019-11-30 NOTE — ED Notes (Signed)
Patient is being discharged from the Urgent Care and sent to the Emergency Department via EMS . Per Wright PA, patient is in need of higher level of care due to EKG changes. Patient is aware and verbalizes understanding of plan of care.  Vitals:   11/30/19 1712  BP: (!) 175/89  Pulse: (!) 118  Resp: 18  Temp: 99.2 F (37.3 C)  SpO2: 95%

## 2019-11-30 NOTE — ED Triage Notes (Signed)
Upper back pain for 1 1/2 weeks.  Patient reports feeling anxious.  Patient reports pressure in chest after much pressing for details.  Patient has bilateral lower extremity swelling for 2 months.  Patient reports no pcp, at one time was taking metformin-off for 1 1/2 years

## 2019-11-30 NOTE — ED Provider Notes (Signed)
[facility] EMERGENCY DEPARTMENT Provider Note   CSN: [id number] Arrival date & time: 11/30/19  1830     History Chief Complaint  Patient presents with  . Back Pain    Teresa Camacho is a 54 y.o. female.   Back Pain Location:  Thoracic spine Quality:  Aching Radiates to:  Does not radiate Pain severity:  Moderate Pain is:  Worse during the night Onset quality:  Gradual Duration:  2 weeks Timing:  Constant Progression:  Waxing and waning Chronicity:  New Context: emotional stress   Context: not recent illness and not recent injury   Context comment:  Recent borken foot has her stuck in bed Worsened by:  Nothing Ineffective treatments:  None tried Associated symptoms: chest pain   Associated symptoms: no bladder incontinence, no bowel incontinence, no dysuria, no fever and no headaches   Chest Pain Associated symptoms: back pain   Associated symptoms: no cough, no diaphoresis, no fever, no headache, no nausea, no palpitations, no shortness of breath and no vomiting     HPI: A 54 year old patient with a history of treated diabetes and obesity presents for evaluation of chest pain. Initial onset of pain was more than 6 hours ago. The patient's chest pain is described as heaviness/pressure/tightness and is not worse with exertion. The patient's chest pain is middle- or left-sided, is not well-localized, is not sharp and does not radiate to the arms/jaw/neck. The patient does not complain of nausea and denies diaphoresis. The patient has no history of stroke, has no history of peripheral artery disease, has not smoked in the past 90 days, has no relevant family history of coronary artery disease (first degree relative at less than age 3), is not hypertensive and has no history of hypercholesterolemia.   Past Medical History:  Diagnosis Date  . Anemia   . Blood transfusion without reported diagnosis   . PCOS (polycystic ovarian syndrome)   . PONV  (postoperative nausea and vomiting)   . Sleep apnea     Patient Active Problem List   Diagnosis Date Noted  . Chest pain 11/30/2019  . Diabetes mellitus type II, non insulin dependent ([place]) 11/30/2019  . Hypertensive urgency 11/30/2019  . Iron deficiency anemia due to chronic blood loss 06/28/2013  . S/P hysterectomy 06/13/2013  . Dysfunctional uterine bleeding 05/25/2013  . Fibroids, submucosal 05/03/2013  . Abnormal uterine bleeding 04/17/2013    Past Surgical History:  Procedure Laterality Date  . ABDOMINAL HYSTERECTOMY N/A 06/13/2013   Procedure: HYSTERECTOMY ABDOMINAL and removal of vaginal skin tag;  Surgeon: Luz Lex, MD;  Location: [facility];  Service: Gynecology;  Laterality: N/A;  . APPENDECTOMY    . CESAREAN SECTION     3x  . CHOLECYSTECTOMY    . CHOLECYSTECTOMY  1991     OB History    Gravida  4   Para  3   Term  3   Preterm      AB      Living  5     SAB      TAB      Ectopic      Multiple      Live Births              Family History  Problem Relation Age of Onset  . Cancer Mother   . Depression Mother   . Diabetes Mother   . Arthritis Mother   . Heart disease Mother   . Learning disabilities Mother   .  Mental illness Mother   . Cancer Father   . Arthritis Father   . Cancer Maternal Aunt        x 2 Aunts    Social History   Tobacco Use  . Smoking status: Never Smoker  . Smokeless tobacco: Never Used  Substance Use Topics  . Alcohol use: No  . Drug use: No    Home Medications Prior to Admission medications   Medication Sig Start Date End Date Taking? Authorizing Provider  acetaminophen (TYLENOL) 500 MG tablet Take 1,000 mg by mouth every 6 (six) hours as needed for mild pain.   Yes [provider]  aspirin EC 81 MG tablet Take 81 mg by mouth daily. Swallow whole.   Yes [provider]  Coenzyme Q10 (COQ10 PO) Take 1 capsule by mouth every evening.   Yes [provider]  famotidine (PEPCID) 20  MG tablet Take 1 tablet (20 mg total) by mouth 2 (two) times daily. Patient taking differently: Take 20 mg by mouth daily as needed for heartburn.  06/18/13  Yes Louretta Shorten, MD  loratadine (CLARITIN) 10 MG tablet Take 10 mg by mouth daily.   Yes [provider]  MELATONIN PO Take 5 tablets by mouth daily as needed (sleep).    Yes [provider]  Multiple Vitamins-Minerals (MULTIVITAMIN WITH MINERALS) tablet Take 1 tablet by mouth daily.   Yes [provider]  naproxen sodium (ALEVE) 220 MG tablet Take 220 mg by mouth every evening.   Yes [provider]  VITAMIN D PO Take 1 tablet by mouth daily.   Yes [provider]  amoxicillin-clavulanate (AUGMENTIN) 875-125 MG tablet Take 1 tablet every 12 (twelve) hours by mouth. Patient not taking: Reported on 11/30/2019 04/04/17   Janne Napoleon, NP  HYDROmorphone (DILAUDID) 2 MG tablet Take 1 tablet (2 mg total) by mouth every 4 (four) hours as needed for moderate pain or severe pain. Patient not taking: Reported on 11/30/2019 06/18/13   Louretta Shorten, MD  ketorolac (TORADOL) 10 MG tablet Take 1 tablet (10 mg total) by mouth every 6 (six) hours. Patient not taking: Reported on 11/30/2019 06/18/13   Louretta Shorten, MD  metoCLOPramide (REGLAN) 10 MG tablet Take 1 tablet (10 mg total) by mouth 3 (three) times daily before meals. Patient not taking: Reported on 11/30/2019 06/18/13   Louretta Shorten, MD  miconazole (MICOTIN) 2 % cream Apply 1 application topically 2 (two) times daily. Patient not taking: Reported on 11/30/2019 03/04/17   Augusto Gamble B, NP  ondansetron (ZOFRAN-ODT) 8 MG disintegrating tablet Take 1 tablet (8 mg total) by mouth every 8 (eight) hours as needed for nausea or vomiting. Patient not taking: Reported on 11/30/2019 06/18/13   Louretta Shorten, MD    Allergies    Codeine  Review of Systems   Review of Systems  Constitutional: Negative for chills, diaphoresis and fever.  HENT: Negative for congestion and  rhinorrhea.   Respiratory: Negative for cough and shortness of breath.   Cardiovascular: Positive for chest pain and leg swelling. Negative for palpitations.  Gastrointestinal: Negative for bowel incontinence, diarrhea, nausea and vomiting.  Genitourinary: Negative for bladder incontinence, difficulty urinating and dysuria.  Musculoskeletal: Positive for back pain. Negative for arthralgias.  Skin: Negative for rash and wound.  Neurological: Negative for light-headedness and headaches.    Physical Exam Updated Vital Signs BP (!) 177/102   Pulse (!) 113   Resp 16   LMP 04/17/2013   SpO2 95%   Physical  Exam Vitals and nursing note reviewed. Exam conducted with a chaperone present.  Constitutional:      General: She is not in acute distress.    Appearance: Normal appearance.  HENT:     Head: Normocephalic and atraumatic.     Nose: No rhinorrhea.  Eyes:     General:        Right eye: No discharge.        Left eye: No discharge.     Conjunctiva/sclera: Conjunctivae normal.  Cardiovascular:     Rate and Rhythm: Normal rate. Rhythm irregular.  Pulmonary:     Effort: Pulmonary effort is normal. No respiratory distress.     Breath sounds: No stridor.  Abdominal:     General: Abdomen is flat. There is no distension.     Palpations: Abdomen is soft.  Musculoskeletal:        General: No tenderness or signs of injury.     Right lower leg: Edema (1+) present.     Left lower leg: Edema (1+) present.  Skin:    General: Skin is warm and dry.  Neurological:     General: No focal deficit present.     Mental Status: She is alert. Mental status is at baseline.     Motor: No weakness.  Psychiatric:        Mood and Affect: Mood normal.        Behavior: Behavior normal.     ED Results / Procedures / Treatments   Labs (all labs ordered are listed, but only abnormal results are displayed) Labs Reviewed  BASIC METABOLIC PANEL - Abnormal; Notable for the following components:       Result Value   CO2 19 (*)    Glucose, Bld 306 (*)    Anion gap 16 (*)    All other components within normal limits  CBC - Abnormal; Notable for the following components:   RBC 5.26 (*)    Hemoglobin 15.2 (*)    HCT 46.2 (*)    All other components within normal limits  BRAIN NATRIURETIC PEPTIDE  MAGNESIUM  TSH  TROPONIN I (HIGH SENSITIVITY)  TROPONIN I (HIGH SENSITIVITY)    EKG None  Radiology CT Angio Chest PE W and/or Wo Contrast  Result Date: 11/30/2019 CLINICAL DATA:  Chest pain EXAM: CT ANGIOGRAPHY CHEST WITH CONTRAST TECHNIQUE: Multidetector CT imaging of the chest was performed using the standard protocol during bolus administration of intravenous contrast. Multiplanar CT image reconstructions and MIPs were obtained to evaluate the vascular anatomy. CONTRAST:  66mL OMNIPAQUE IOHEXOL 350 MG/ML SOLN COMPARISON:  None. FINDINGS: Cardiovascular: This is a technically adequate evaluation of the pulmonary vasculature. There are no filling defects or pulmonary emboli. The heart is unremarkable without pericardial effusion. Normal caliber of the thoracic aorta. Mediastinum/Nodes: No enlarged mediastinal, hilar, or axillary lymph nodes. Thyroid gland, trachea, and esophagus demonstrate no significant findings. Lungs/Pleura: No airspace disease, effusion, or pneumothorax. Central airways are widely patent. Upper Abdomen: No acute abnormality. Musculoskeletal: No acute or destructive bony lesions. Reconstructed images demonstrate no additional findings. Review of the MIP images confirms the above findings. IMPRESSION: 1. No evidence of pulmonary embolus. 2. No acute intrathoracic process. Electronically Signed   By: Randa Ngo M.D.   On: 11/30/2019 20:07    Procedures Procedures (including critical care time)  Medications Ordered in ED Medications  diphenhydrAMINE (BENADRYL) capsule 25 mg (25 mg Oral Given 11/30/19 1902)  iohexol (OMNIPAQUE) 350 MG/ML injection 75 mL (75 mLs  Intravenous Contrast  Given 11/30/19 1959)    ED Course  I have reviewed the triage vital signs and the nursing notes.  Pertinent labs & imaging results that were available during my care of the patient were reviewed by me and considered in my medical decision making (see chart for details).    MDM Rules/Calculators/A&P HEAR Score: 96                        54 year old female comes with multiple complaints to include sharp back pain, chest pain, leg swelling.  She was seen in urgent care with concerning EKG findings to include nonspecific ST and T wave changes.  After my review of his EKGs quality is poor there are few PVCs however no acute signs of ST elevation MI.  She was given full dose aspirin there.  Her hear score is 4, her risk factors include obesity age and diabetes.  Her chest pain descriptor and back pain descriptor do not show significant concerns at this time for ACS however she will get cardiac biomarker testing.  She will get a PE study as she has been sedentary and had a recent traumatic injury, this also with the lower extremity swelling.  I do not feel this is dissection and she has strong equal pulses.  She does does not describe a tearing pain or sudden onset in order her vital signs particular support a dissection.  She is overall well-appearing and has a normal physical exam other than the pitting edema.  The patient remained hemodynamically stable although hypertensive.  She is with a detectable but negative troponin, CT scan reviewed by radiology myself shows no acute PE or other significant emergent pathology.  Other laboratory testing to include a BNP fairly unremarkable as well.  Based on her hear score and her laboratory testing she would fall into the category of observation versus discharge with close outpatient follow-up.  She does not have outpatient providers, her chronic medical conditions are poorly managed.  I think she would benefit from admission for medical  optimization.  The patient will be admitted to the hospitalist.  For the remainder this patient's care please see inpatient team notes.  I will intervene as needed while the patient remains in the emergency department.   Final Clinical Impression(s) / ED Diagnoses Final diagnoses:  Chest pain, unspecified type    Rx / DC Orders ED Discharge Orders    None       Breck Coons, MD 11/30/19 2303

## 2019-11-30 NOTE — ED Triage Notes (Signed)
Pt BIB EMS from [facility] due to mid back pain x1 week. Pt also reports weakness x2 days & bilateral swelling. Pt was also sent here from [facility] due to EKG changes.

## 2019-11-30 NOTE — ED Notes (Signed)
EMS called per [place], [place]

## 2019-11-30 NOTE — ED Provider Notes (Addendum)
[place]   MRN: [id number] DOB: 1965/06/07  Subjective:   Teresa Camacho is a 54 y.o. female presenting for 1.5-week history of upper back pain, chest pressure sensation.  Has felt like her blood pressure is elevated.  Has never been diagnosed with this but has not gotten consistent primary care over the past year and a half.  Use to have elevated blood sugar, was taking metformin but stopped taking it due to no follow-up.  Over the past 2 months has had daily intermittent lower leg swelling.  She is very anxious about her health and the chest pressure she has.  Has been using Tylenol, Aleve with minimal relief.  Denies falls, trauma, heavy lifting.  Denies history of heart issues.  Has had her Covid vaccination. Has taken naproxen at 10:00am, took ~400mg .   No current facility-administered medications for this encounter.  Current Outpatient Medications:  .  aspirin EC 81 MG tablet, Take 81 mg by mouth daily. Swallow whole., Disp: , Rfl:  .  famotidine (PEPCID) 20 MG tablet, Take 1 tablet (20 mg total) by mouth 2 (two) times daily., Disp: 60 tablet, Rfl: 0 .  loratadine (CLARITIN) 10 MG tablet, Take 10 mg by mouth daily., Disp: , Rfl:  .  MELATONIN PO, Take 1 tablet by mouth as needed (sleep)., Disp: , Rfl:  .  acetaminophen (TYLENOL) 500 MG tablet, Take 1,000 mg by mouth every 6 (six) hours as needed for mild pain., Disp: , Rfl:  .  amoxicillin-clavulanate (AUGMENTIN) 875-125 MG tablet, Take 1 tablet every 12 (twelve) hours by mouth., Disp: 18 tablet, Rfl: 0 .  Dexlansoprazole (DEXILANT PO), Take 1 capsule by mouth daily., Disp: , Rfl:  .  docusate sodium (COLACE) 100 MG capsule, Take 100 mg by mouth 2 (two) times daily., Disp: , Rfl:  .  HYDROmorphone (DILAUDID) 2 MG tablet, Take 1 tablet (2 mg total) by mouth every 4 (four) hours as needed for moderate pain or severe pain., Disp: 30 tablet, Rfl: 0 .  IRON PO, Take 3 tablets by mouth daily., Disp: , Rfl:  .  ketorolac (TORADOL) 10  MG tablet, Take 1 tablet (10 mg total) by mouth every 6 (six) hours., Disp: 30 tablet, Rfl: 0 .  metoCLOPramide (REGLAN) 10 MG tablet, Take 1 tablet (10 mg total) by mouth 3 (three) times daily before meals., Disp: 50 tablet, Rfl: 0 .  miconazole (MICOTIN) 2 % cream, Apply 1 application topically 2 (two) times daily., Disp: 28.35 g, Rfl: 0 .  Multiple Vitamins-Minerals (MULTIVITAMIN WITH MINERALS) tablet, Take 1 tablet by mouth daily., Disp: , Rfl:  .  ondansetron (ZOFRAN-ODT) 8 MG disintegrating tablet, Take 1 tablet (8 mg total) by mouth every 8 (eight) hours as needed for nausea or vomiting., Disp: 20 tablet, Rfl: 0   Allergies  Allergen Reactions  . Codeine Nausea And Vomiting    Pt is able to take percocet & vicodin    Past Medical History:  Diagnosis Date  . Anemia   . Blood transfusion without reported diagnosis   . PCOS (polycystic ovarian syndrome)   . PONV (postoperative nausea and vomiting)   . Sleep apnea      Past Surgical History:  Procedure Laterality Date  . ABDOMINAL HYSTERECTOMY N/A 06/13/2013   Procedure: HYSTERECTOMY ABDOMINAL and removal of vaginal skin tag;  Surgeon: Luz Lex, MD;  Location: [facility];  Service: Gynecology;  Laterality: N/A;  . APPENDECTOMY    . CESAREAN SECTION  3x  . CHOLECYSTECTOMY    . CHOLECYSTECTOMY  1991    Family History  Problem Relation Age of Onset  . Cancer Mother   . Depression Mother   . Diabetes Mother   . Arthritis Mother   . Heart disease Mother   . Learning disabilities Mother   . Mental illness Mother   . Cancer Father   . Arthritis Father   . Cancer Maternal Aunt        x 2 Aunts    Social History   Tobacco Use  . Smoking status: Never Smoker  . Smokeless tobacco: Never Used  Substance Use Topics  . Alcohol use: No  . Drug use: No    ROS   Objective:   Vitals: BP (!) 175/89 (BP Location: Right Arm) Comment (BP Location): large cuff  Pulse (!) 118   Temp 99.2 F (37.3 C) (Oral)   Resp 18    LMP 04/17/2013   SpO2 95%   Pulse was 116 on recheck.   Wt Readings from Last 3 Encounters:  11/05/15 230 lb (104.3 kg)  06/08/13 210 lb (95.3 kg)  06/12/13 204 lb (92.5 kg)   Temp Readings from Last 3 Encounters:  11/30/19 99.2 F (37.3 C) (Oral)  04/04/17 98.3 F (36.8 C) (Oral)  03/04/17 98.3 F (36.8 C) (Oral)   BP Readings from Last 3 Encounters:  11/30/19 (!) 175/89  04/04/17 (!) 143/86  03/04/17 132/86   Pulse Readings from Last 3 Encounters:  11/30/19 (!) 118  04/04/17 84  03/04/17 100   Physical Exam Constitutional:      General: She is not in acute distress.    Appearance: Normal appearance. She is well-developed. She is obese. She is not ill-appearing, toxic-appearing or diaphoretic.  HENT:     Head: Normocephalic and atraumatic.     Nose: Nose normal.     Mouth/Throat:     Mouth: Mucous membranes are moist.  Eyes:     Extraocular Movements: Extraocular movements intact.     Pupils: Pupils are equal, round, and reactive to light.  Cardiovascular:     Rate and Rhythm: Normal rate and regular rhythm.     Pulses: Normal pulses.     Heart sounds: Normal heart sounds. No murmur heard.  No friction rub. No gallop.   Pulmonary:     Effort: Pulmonary effort is normal. No respiratory distress.     Breath sounds: Normal breath sounds. No stridor. No wheezing, rhonchi or rales.  Abdominal:     General: Bowel sounds are normal. There is no distension.     Palpations: Abdomen is soft. There is no mass.     Tenderness: There is abdominal tenderness (lower abdomen). There is no right CVA tenderness, left CVA tenderness, guarding or rebound.  Musculoskeletal:       Back:     Right lower leg: Edema (1+ up to lower calves bilaterally) present.     Left lower leg: Edema present.  Skin:    General: Skin is warm and dry.     Findings: No rash.  Neurological:     Mental Status: She is alert and oriented to person, place, and time.  Psychiatric:        Mood and  Affect: Mood normal.        Behavior: Behavior normal.        Thought Content: Thought content normal.        Judgment: Judgment normal.     Results for orders  placed or performed during the hospital encounter of 11/30/19 (from the past 24 hour(s))  POC CBG monitoring     Status: Abnormal   Collection Time: 11/30/19  5:38 PM  Result Value Ref Range   Glucose-Capillary 316 (H) 70 - 99 mg/dL   ED ECG REPORT   Date: 11/30/2019  Rate: 116bpm  Rhythm: sinus tachycardia  QRS Axis: normal  Intervals: normal  ST/T Wave abnormalities: T wave inversion in leads II, 3, aVF with slight ST depression in aVF  Conduction Disutrbances:none  Narrative Interpretation: T wave inversion and slight ST depression in aVF/inferior leads starkly different from previous EKG from 2015 concerning for ACS, sinus tachycardia at 116 bpm.  Frequent PVCs.  Old EKG Reviewed: changes noted  I have personally reviewed the EKG tracing and agree with the computerized printout as noted.   Assessment and Plan :   PDMP not reviewed this encounter.  1. Chest pressure   2. Acute bilateral thoracic back pain   3. 1+ pitting edema   4. Nonspecific abnormal electrocardiogram (ECG) (EKG)     Patient given 324 mg chewable aspirin in clinic.  An IV line was established as EMS arrived, patient was not given sublingual nitroglycerin prior to EMS arrival.  Report out done for concerning ACS, EMS transporting patient to [facility] emergency room now.   Jaynee Eagles, PA-C 11/30/19 [place], PA-C 12/01/19 [phone]

## 2019-12-01 LAB — BASIC METABOLIC PANEL
Anion gap: 14 (ref 5–15)
BUN: 9 mg/dL (ref 6–20)
CO2: 21 mmol/L — ABNORMAL LOW (ref 22–32)
Calcium: 8.6 mg/dL — ABNORMAL LOW (ref 8.9–10.3)
Chloride: 102 mmol/L (ref 98–111)
Creatinine, Ser: 0.58 mg/dL (ref 0.44–1.00)
GFR calc Af Amer: >60 mL/min (ref >60)
GFR calc non Af Amer: >60 mL/min (ref >60)
Glucose, Bld: 329 mg/dL — ABNORMAL HIGH (ref 70–99)
Potassium: 3.9 mmol/L (ref 3.5–5.1)
Sodium: 137 mmol/L (ref 135–145)

## 2019-12-01 LAB — LIPID PANEL
Cholesterol: 282 mg/dL — ABNORMAL HIGH (ref 0–200)
HDL: 36 mg/dL — ABNORMAL LOW (ref >40)
LDL Cholesterol: UNDETERMINED mg/dL (ref 0–99)
Total CHOL/HDL Ratio: 7.8 RATIO
Triglycerides: 678 mg/dL — ABNORMAL HIGH (ref <150)
VLDL: UNDETERMINED mg/dL (ref 0–40)

## 2019-12-01 LAB — HEMOGLOBIN A1C
Hgb A1c MFr Bld: 11.5 % — ABNORMAL HIGH (ref 4.8–5.6)
Mean Plasma Glucose: 283.35 mg/dL

## 2019-12-01 LAB — CBC
HCT: 45 % (ref 36.0–46.0)
Hemoglobin: 14.6 g/dL (ref 12.0–15.0)
MCH: 28.7 pg (ref 26.0–34.0)
MCHC: 32.4 g/dL (ref 30.0–36.0)
MCV: 88.6 fL (ref 80.0–100.0)
Platelets: 286 10*3/uL (ref 150–400)
RBC: 5.08 MIL/uL (ref 3.87–5.11)
RDW: 13.6 % (ref 11.5–15.5)
WBC: 7.9 10*3/uL (ref 4.0–10.5)
nRBC: 0 % (ref 0.0–0.2)

## 2019-12-01 LAB — SARS CORONAVIRUS 2 BY RT PCR (HOSPITAL ORDER, PERFORMED IN ~~LOC~~ HOSPITAL LAB): SARS Coronavirus 2: NEGATIVE

## 2019-12-01 LAB — GLUCOSE, CAPILLARY
Glucose-Capillary: 299 mg/dL — ABNORMAL HIGH (ref 70–99)
Glucose-Capillary: 283 mg/dL — ABNORMAL HIGH (ref 70–99)
Glucose-Capillary: 285 mg/dL — ABNORMAL HIGH (ref 70–99)

## 2019-12-01 LAB — LDL CHOLESTEROL, DIRECT: Direct LDL: 112.6 mg/dL — ABNORMAL HIGH (ref 0–99)

## 2019-12-01 LAB — CBG MONITORING, ED
Glucose-Capillary: 327 mg/dL — ABNORMAL HIGH (ref 70–99)
Glucose-Capillary: 308 mg/dL — ABNORMAL HIGH (ref 70–99)

## 2019-12-01 LAB — HIV ANTIBODY (ROUTINE TESTING W REFLEX): HIV Screen 4th Generation wRfx: NONREACTIVE

## 2019-12-01 MED ORDER — PANTOPRAZOLE SODIUM 40 MG PO TBEC
40.0000 mg | DELAYED_RELEASE_TABLET | Freq: Every day | ORAL | 2 refills | Status: DC
Start: 2019-12-01 — End: 2019-12-26

## 2019-12-01 MED ORDER — INSULIN GLARGINE 100 UNIT/ML ~~LOC~~ SOLN
20.0000 [IU] | Freq: Every day | SUBCUTANEOUS | Status: DC
  Administered 2019-12-01 – 2019-12-02 (×2): 20 [IU] via SUBCUTANEOUS
  Filled 2019-12-01 (×4): qty 0.2

## 2019-12-01 MED ORDER — ASPIRIN EC 81 MG PO TBEC: 81.0000 mg | DELAYED_RELEASE_TABLET | Freq: Every day | ORAL | 2 refills | Status: DC

## 2019-12-01 MED ORDER — KETOROLAC TROMETHAMINE 15 MG/ML IJ SOLN
7.5000 mg | Freq: Three times a day (TID) | INTRAMUSCULAR | Status: DC | PRN
  Administered 2019-12-01 – 2019-12-02 (×4): 15 mg via INTRAVENOUS
  Filled 2019-12-01 (×4): qty 1

## 2019-12-01 MED ORDER — LIVING WELL WITH DIABETES BOOK
Freq: Once | Status: AC
  Filled 2019-12-01: qty 1

## 2019-12-01 MED ORDER — LISINOPRIL 10 MG PO TABS: 10.0000 mg | ORAL_TABLET | Freq: Every day | ORAL | 2 refills | Status: DC

## 2019-12-01 MED ORDER — LIDOCAINE 5 % EX PTCH: 1.0000 | MEDICATED_PATCH | CUTANEOUS | 0 refills | Status: DC

## 2019-12-01 MED ORDER — LANTUS SOLOSTAR 100 UNIT/ML ~~LOC~~ SOPN: 20.0000 [IU] | PEN_INJECTOR | Freq: Every day | SUBCUTANEOUS | 1 refills | Status: DC

## 2019-12-01 MED ORDER — LIDOCAINE 5 % EX PTCH
1.0000 | MEDICATED_PATCH | CUTANEOUS | Status: DC
  Administered 2019-12-01: 1 via TRANSDERMAL
  Filled 2019-12-01: qty 1

## 2019-12-01 MED ORDER — HYDROMORPHONE HCL 2 MG PO TABS
1.0000 mg | ORAL_TABLET | Freq: Four times a day (QID) | ORAL | Status: DC | PRN
  Filled 2019-12-01: qty 1

## 2019-12-01 MED ORDER — METFORMIN HCL 500 MG PO TABS: 500.0000 mg | ORAL_TABLET | Freq: Two times a day (BID) | ORAL | 2 refills | Status: DC

## 2019-12-01 MED ORDER — INSULIN PEN NEEDLE 32G X 4 MM MISC: 1.0000 | Freq: Two times a day (BID) | 1 refills | Status: DC

## 2019-12-01 MED FILL — LIDOCAINE PATCH 5%: 5 | 30 days supply | Qty: 30 | Fill #0

## 2019-12-01 MED FILL — ASPIRIN LOW DOSE 81 MG TBEC: 81 | 30 days supply | Qty: 30 | Fill #0

## 2019-12-01 MED FILL — metFORMIN HCL 500 MG TABS: 500 | 30 days supply | Qty: 60 | Fill #0

## 2019-12-01 MED FILL — LISINOPRIL 10 MG TABS: 10 | 30 days supply | Qty: 30 | Fill #0

## 2019-12-01 MED FILL — LANTUS SOLOSTAR 100 UNITS/M: 100 | 30 days supply | Qty: 6 | Fill #0

## 2019-12-01 MED FILL — PANTOPRAZOLE SOD DR 40 MG T: 40 | 30 days supply | Qty: 30 | Fill #0

## 2019-12-01 MED FILL — BD PEN NDL NANO 32GX5/32: 32G X 4 MM | 30 days supply | Qty: 100 | Fill #0

## 2019-12-01 NOTE — Progress Notes (Signed)
Patient has home CPAP set up on bedside table. Patient stated she can place herself on/off without assistance. RT will monitor as needed.

## 2019-12-01 NOTE — ED Notes (Signed)
Breakfast ordered--Teresa Camacho  

## 2019-12-01 NOTE — Progress Notes (Signed)
Inpatient Diabetes Program Recommendations  AACE/ADA: New Consensus Statement on Inpatient Glycemic Control (2015)  Target Ranges:  Prepandial:   less than 140 mg/dL      Peak postprandial:   less than 180 mg/dL (1-2 hours)      Critically ill patients:  140 - 180 mg/dL   Lab Results  Component Value Date   GLUCAP 299 (H) 12/01/2019   HGBA1C 11.5 (H) 12/01/2019    Review of Glycemic Control  Diabetes history: New onset  Current orders for Inpatient glycemic control:  lantus 20 units Novolog 0-9 units tid + hs  Inpatient Diabetes Program Recommendations:    Spoke with patient about new diabetes diagnosis.  Discussed A1C results (11.5%) and explained what an A1C is. Discussed basic pathophysiology of DM Type 2, basic home care, importance of checking CBGs and maintaining good CBG control to prevent long-term and short-term complications. Reviewed glucose and A1C goals and explained that patient will need to continue to  Reviewed signs and symptoms of hyperglycemia and hypoglycemia along with treatment for both. Discussed impact of nutrition, exercise, stress, sickness, and medications on diabetes control. Reviewed Living Well with diabetes booklet and encouraged patient to read through entire book. Informed patient that she will be prescribed basal insulin at time of d/c.  Asked patient to check his glucose at least 2 times per day (fasting and alternating second check) and to keep a log book of glucose readings and insulin taken. Explained how the doctor he follows up with can use the log book to continue to make insulin adjustments if needed. Reviewed and demonstrated how to draw up and administer insulin with vial and syringe and insulin pen. Pt prefers insulin pen.  Informed patient that RN will be asking him to self-administer insulin to ensure proper technique and ability to administer self insulin shots.  Patient verbalized understanding of information discussed and he states that he  has no further questions at this time related to diabetes.  RNs to provide ongoing basic DM education at bedside with this patient and engage patient to actively check blood glucose and administer insulin injections.   Spoke with Hassan Rowan with [facility] team. discussed need for clinic follow up and match letter for d/c tomorrow. She mentioned [facility] pharmacy can fill prescriptions if sent today. Will call attending with information.  Lantus solostar insulin pen order # [id number] Insulin pen needles order # [id number] May also consider metformin 500 mg bid  Thanks,  Tama Headings RN, MSN, BC-ADM Inpatient Diabetes Coordinator Team Pager [phone] (8a-5p)

## 2019-12-01 NOTE — Progress Notes (Signed)
PROGRESS NOTE    Teresa Camacho  UMP:[id number] DOB: [birth year] DOA: 11/30/2019 PCP: Patient, No Pcp Per   Brief Narrative: Patient is a 54 year old female with history of type 2 diabetes mellitus, OSA on CPAP, acid reflux who presents to the emergency department for the evaluation of upper back discomfort.  Reported 2 to 4 weeks of vague discomfort in upper back rating the shoulders.  She thinks that it is from her GERD.  Patient was previously taking Metformin but does not take any medications and she does not have a PCP.  On presentation her blood pressure was elevated, she was tachycardic.  CTA chest was negative for PE or any other acute abnormality.  Found to be hyper glycemic.  Hemoglobin A1c of more than 11.5.  Troponins were normal.  She was admitted for further management.  Social worker and diabetic coordinator  consulted.  Assessment & Plan:   Principal Problem:   Chest pain Active Problems:   Diabetes mellitus type II, non insulin dependent (HCC)   Hypertensive urgency   Back pain/chest pain: Ongoing for 1 month.  Attributes to GERD.  Continue pepcid.  EKG did not show any ischemic changes.  Serial troponins negative.  Normal BNP.  CT chest without any acute findings.  Continue supportive care.  Lidocaine patch on the upper back.Given GI cocktail.  Hypertension: Does not have a PCP and does not take any medications.  Hypertension presentation.  Started on lisinopril.  Continue to monitor blood pressure.  Uncontrolled type 2 diabetes mellitus: Previously taking Metformin but not taking any medications for now.  Elevated blood sugars.  Hemoglobin A1c of 11.5.  Diabetic coordinator consulted.  She might need insulin on discharge.  OSA: Continue CPAP at night.  Elevated triglycerides: Triglyceride of 678.  LDL not calculated.  We will repeat fasting lipid panel tomorrow.  Needs to be started on Lipitor or fenofibrate on discharge.  Uninsured: Does not have PCP.  We have requested  social worker consultation for assistance.          DVT prophylaxis:Lovenox Code Status: Full Family Communication:  Status is: inpatient   Dispo: The patient is from: Home              Anticipated d/c is to: Home              Anticipated d/c date is: 1 day              Patient currently is not medically stable to d/c.     Consultants: None  Procedures:None  Antimicrobials:  Anti-infectives (From admission, onward)   None      Subjective:  Patient seen and examined at the bedside this morning.  Hemodynamically stable.  Complaints of upper back pain and it spreads across her shoulders.  Looked comfortable during my evaluation.    Objective: Vitals:   12/01/19 0730 12/01/19 0745 12/01/19 0800 12/01/19 0815  BP: 125/80 128/78 129/71 126/75  Pulse: (!) 105 (!) 103 (!) 104 (!) 104  Resp: 19 (!) 21 (!) 23 (!) 24  Temp:      SpO2: 94% 96% 94% 95%   No intake or output data in the 24 hours ending 12/01/19 0834 There were no vitals filed for this visit.  Examination:  General exam: Appears calm and comfortable ,Not in distress,obese HEENT:PERRL,Oral mucosa moist, Ear/Nose normal on gross exam Respiratory system: Bilateral equal air entry, normal vesicular breath sounds, no wheezes or crackles  Cardiovascular system: S1 & S2 heard, RRR.  No JVD, murmurs, rubs, gallops or clicks. No pedal edema. Gastrointestinal system: Abdomen is nondistended, soft and nontender. No organomegaly or masses felt. Normal bowel sounds heard. Central nervous system: Alert and oriented. . Extremities: No edema, no clubbing ,no cyanosis Skin: No rashes, lesions or ulcers,no icterus ,no pallor   Data Reviewed: I have personally reviewed following labs and imaging studies  CBC: Recent Labs  Lab 11/30/19 1845 12/01/19 0537  WBC 8.5 7.9  HGB 15.2* 14.6  HCT 46.2* 45.0  MCV 87.8 88.6  PLT 257 759   Basic Metabolic Panel: Recent Labs  Lab 11/30/19 1845 11/30/19 2143 12/01/19 0537   NA 136  --  137  K 3.7  --  3.9  CL 101  --  102  CO2 19*  --  21*  GLUCOSE 306*  --  329*  BUN 11  --  9  CREATININE 0.56  --  0.58  CALCIUM 9.1  --  8.6*  MG  --  1.8  --    GFR: CrCl cannot be calculated (Unknown ideal weight.). Liver Function Tests: No results for input(s): AST, ALT, ALKPHOS, BILITOT, PROT, ALBUMIN in the last 168 hours. No results for input(s): LIPASE, AMYLASE in the last 168 hours. No results for input(s): AMMONIA in the last 168 hours. Coagulation Profile: No results for input(s): INR, PROTIME in the last 168 hours. Cardiac Enzymes: No results for input(s): CKTOTAL, CKMB, CKMBINDEX, TROPONINI in the last 168 hours. BNP (last 3 results) No results for input(s): PROBNP in the last 8760 hours. HbA1C: Recent Labs    12/01/19 0537  HGBA1C 11.5*   CBG: Recent Labs  Lab 11/30/19 1738 12/01/19 0143 12/01/19 0753  GLUCAP 316* 327* 308*   Lipid Profile: Recent Labs    12/01/19 0537  CHOL 282*  HDL 36*  LDLCALC UNABLE TO CALCULATE IF TRIGLYCERIDE OVER 400 mg/dL  TRIG 678*  CHOLHDL 7.8  LDLDIRECT 112.6*   Thyroid Function Tests: Recent Labs    11/30/19 2143  TSH 2.329   Anemia Panel: No results for input(s): VITAMINB12, FOLATE, FERRITIN, TIBC, IRON, RETICCTPCT in the last 72 hours. Sepsis Labs: No results for input(s): PROCALCITON, LATICACIDVEN in the last 168 hours.  Recent Results (from the past 240 hour(s))  SARS Coronavirus 2 by RT PCR (hospital order, performed in [facility] hospital lab) Nasopharyngeal Nasopharyngeal Swab     Status: None   Collection Time: 12/01/19 12:15 AM   Specimen: Nasopharyngeal Swab  Result Value Ref Range Status   SARS Coronavirus 2 NEGATIVE NEGATIVE Final    Comment: (NOTE) SARS-CoV-2 target nucleic acids are NOT DETECTED.  The SARS-CoV-2 RNA is generally detectable in upper and lower respiratory specimens during the acute phase of infection. The lowest concentration of SARS-CoV-2 viral copies this  assay can detect is 250 copies / mL. A negative result does not preclude SARS-CoV-2 infection and should not be used as the sole basis for treatment or other patient management decisions.  A negative result may occur with improper specimen collection / handling, submission of specimen other than nasopharyngeal swab, presence of viral mutation(s) within the areas targeted by this assay, and inadequate number of viral copies (<250 copies / mL). A negative result must be combined with clinical observations, patient history, and epidemiological information.  Fact Sheet for Patients:   StrictlyIdeas.no  Fact Sheet for Healthcare Providers: BankingDealers.co.za  This test is not yet approved or  cleared by the [place] FDA and has been authorized for detection and/or diagnosis of  SARS-CoV-2 by FDA under an Emergency Use Authorization (EUA).  This EUA will remain in effect (meaning this test can be used) for the duration of the COVID-19 declaration under Section 564(b)(1) of the Act, 21 U.S.C. section 360bbb-3(b)(1), unless the authorization is terminated or revoked sooner.  Performed at [facility], [place] [address] [place], [place] [address]          Radiology Studies: CT Angio Chest PE W and/or Wo Contrast  Result Date: 11/30/2019 CLINICAL DATA:  Chest pain EXAM: CT ANGIOGRAPHY CHEST WITH CONTRAST TECHNIQUE: Multidetector CT imaging of the chest was performed using the standard protocol during bolus administration of intravenous contrast. Multiplanar CT image reconstructions and MIPs were obtained to evaluate the vascular anatomy. CONTRAST:  107mL OMNIPAQUE IOHEXOL 350 MG/ML SOLN COMPARISON:  None. FINDINGS: Cardiovascular: This is a technically adequate evaluation of the pulmonary vasculature. There are no filling defects or pulmonary emboli. The heart is unremarkable without pericardial effusion. Normal caliber of the thoracic  aorta. Mediastinum/Nodes: No enlarged mediastinal, hilar, or axillary lymph nodes. Thyroid gland, trachea, and esophagus demonstrate no significant findings. Lungs/Pleura: No airspace disease, effusion, or pneumothorax. Central airways are widely patent. Upper Abdomen: No acute abnormality. Musculoskeletal: No acute or destructive bony lesions. Reconstructed images demonstrate no additional findings. Review of the MIP images confirms the above findings. IMPRESSION: 1. No evidence of pulmonary embolus. 2. No acute intrathoracic process. Electronically Signed   By: Randa Ngo M.D.   On: 11/30/2019 20:07        Scheduled Meds: . aspirin EC  81 mg Oral Daily  . enoxaparin (LOVENOX) injection  40 mg Subcutaneous Daily  . insulin aspart  0-5 Units Subcutaneous QHS  . insulin aspart  0-9 Units Subcutaneous TID WC  . insulin glargine  20 Units Subcutaneous Daily  . lisinopril  10 mg Oral Daily   Continuous Infusions: . famotidine (PEPCID) IV Stopped (12/01/19 0049)     LOS: 0 days    Time spent: 35 mins. More than 50% of that time was spent in counseling and/or coordination of care.      Shelly Coss, MD [facility] P7/16/2021, 8:34 AM

## 2019-12-01 NOTE — TOC Initial Note (Addendum)
Transition of Care [facility]) - Initial/Assessment Note    Patient Details  Name: Teresa Camacho MRN: [id number] Date of Birth: 1965/07/02  [facility] of [facility]) CM/SW Contact:    Bethena Roys, RN Phone Number: 12/01/2019, 2:30 PM  Clinical Narrative:  Patient presented for upper back discomfort. Patient with elevated blood sugar. Patient was consulted by Diabetes Educator. Patient in need of medication assistance. Case Manager consulted- scheduled an appointment at the [facility] and [facility]. Appointment listed on the AVS. Patient states she is currently not working at this time. MATCH completed for medication assistance. Transitions of care pharmacy to deliver the medications today. No further needs from Case Manager at this time.             1517 12-01-19 Medications have been MATCH and filled- please call main pharmacy at d/c to get medications for this patient.   Expected Discharge Plan: Home/Self Care Barriers to Discharge: No Barriers Identified   Patient Goals and CMS Choice Patient states their goals for this hospitalization and ongoing recovery are:: to return home.   Choice offered to / list presented to : NA  Expected Discharge Plan and Services Expected Discharge Plan: Home/Self Care In-house Referral: NA Discharge Planning Services: CM Consult, [place], [facility], Medication Assistance Post Acute Care Choice: NA Living arrangements for the past 2 months: Single Family Home                 DME Arranged: N/A DME Agency: NA       HH Arranged: NA    Prior Living Arrangements/Services Living arrangements for the past 2 months: Single Family Home Lives with:: Self Patient language and need for interpreter reviewed:: Yes Do you feel safe going back to the place where you live?: Yes      Need for Family Participation in Patient Care: No (Comment) Care giver support system in place?: No (comment)   Criminal Activity/Legal  Involvement Pertinent to Current Situation/Hospitalization: No - Comment as needed  Activities of Daily Living      Permission Sought/Granted Permission sought to share information with : Family Supports, Case Manager    Emotional Assessment Appearance:: Appears stated age Attitude/Demeanor/Rapport: Engaged Affect (typically observed): Appropriate Orientation: : Oriented to Situation, Oriented to  Time, Oriented to Place, Oriented to Self Alcohol / Substance Use: Not Applicable Psych Involvement: No (comment)  Admission diagnosis:  Chest pain [R07.9] Chest pain, unspecified type [R07.9] Patient Active Problem List   Diagnosis Date Noted  . Chest pain 11/30/2019  . Diabetes mellitus type II, non insulin dependent ([place]) 11/30/2019  . Hypertensive urgency 11/30/2019  . Iron deficiency anemia due to chronic blood loss 06/28/2013  . S/P hysterectomy 06/13/2013  . Dysfunctional uterine bleeding 05/25/2013  . Fibroids, submucosal 05/03/2013  . Abnormal uterine bleeding 04/17/2013   PCP:  Patient, No Pcp Per Pharmacy:   [facility] [address], [place] - [place] [address] [place] [place] [address] Phone: [phone] Fax: [place], [place] - [address] [place] [address] [place] [address] [place] [address] Phone: [phone] Fax: [phone]  [place], [place] - [address] [facility]. [address] [place] [address] Phone: [phone] Fax: [place], [place] - [address] [place] [address] Phone: [phone] Fax: [phone]  Readmission Risk Interventions No flowsheet data found.

## 2019-12-02 ENCOUNTER — Other Ambulatory Visit (HOSPITAL_COMMUNITY): Payer: Self-pay

## 2019-12-02 LAB — BASIC METABOLIC PANEL
Anion gap: 13 (ref 5–15)
BUN: 10 mg/dL (ref 6–20)
CO2: 21 mmol/L — ABNORMAL LOW (ref 22–32)
Calcium: 8.9 mg/dL (ref 8.9–10.3)
Chloride: 103 mmol/L (ref 98–111)
Creatinine, Ser: 0.49 mg/dL (ref 0.44–1.00)
GFR calc Af Amer: >60 mL/min (ref >60)
GFR calc non Af Amer: >60 mL/min (ref >60)
Glucose, Bld: 323 mg/dL — ABNORMAL HIGH (ref 70–99)
Potassium: 3.8 mmol/L (ref 3.5–5.1)
Sodium: 137 mmol/L (ref 135–145)

## 2019-12-02 LAB — LIPID PANEL
Cholesterol: 290 mg/dL — ABNORMAL HIGH (ref 0–200)
HDL: 36 mg/dL — ABNORMAL LOW (ref >40)
LDL Cholesterol: UNDETERMINED mg/dL (ref 0–99)
Total CHOL/HDL Ratio: 8.1 RATIO
Triglycerides: 840 mg/dL — ABNORMAL HIGH (ref <150)
VLDL: UNDETERMINED mg/dL (ref 0–40)

## 2019-12-02 LAB — LDL CHOLESTEROL, DIRECT: Direct LDL: 95.5 mg/dL (ref 0–99)

## 2019-12-02 LAB — TROPONIN I (HIGH SENSITIVITY): Troponin I (High Sensitivity): 6 ng/L (ref <18)

## 2019-12-02 LAB — GLUCOSE, CAPILLARY
Glucose-Capillary: 310 mg/dL — ABNORMAL HIGH (ref 70–99)
Glucose-Capillary: 234 mg/dL — ABNORMAL HIGH (ref 70–99)

## 2019-12-02 MED ORDER — BLOOD GLUCOSE MONITOR KIT: PACK | 0 refills | Status: AC

## 2019-12-02 MED ORDER — METOPROLOL TARTRATE 25 MG PO TABS
25.0000 mg | ORAL_TABLET | Freq: Two times a day (BID) | ORAL | 1 refills | Status: DC
Start: 2019-12-02 — End: 2019-12-26

## 2019-12-02 MED ORDER — ALUM & MAG HYDROXIDE-SIMETH 200-200-20 MG/5ML PO SUSP
30.0000 mL | ORAL | Status: DC | PRN
  Administered 2019-12-02: 30 mL via ORAL
  Filled 2019-12-02: qty 30

## 2019-12-02 MED ORDER — INSULIN GLARGINE 100 UNIT/ML ~~LOC~~ SOLN: 30.0000 [IU] | Freq: Every day | SUBCUTANEOUS | Status: DC

## 2019-12-02 MED ORDER — PANTOPRAZOLE SODIUM 40 MG PO TBEC
40.0000 mg | DELAYED_RELEASE_TABLET | Freq: Every day | ORAL | Status: DC
  Administered 2019-12-02: 40 mg via ORAL
  Filled 2019-12-02: qty 1

## 2019-12-02 MED ORDER — ATORVASTATIN CALCIUM 80 MG PO TABS
80.0000 mg | ORAL_TABLET | Freq: Every day | ORAL | 1 refills | Status: DC
Start: 2019-12-02 — End: 2019-12-26

## 2019-12-02 NOTE — Progress Notes (Signed)
Pt c/o of pain in the back, nausea and abdominal pain.   BP: 154/96 HR: 104  Oxygen 95% on room air  PRN Zofran, Tylenol given. PRN cardiology order implemented and   Maalox administered. EKG completed.  Upon re-assessment, Pt verbalized improvement of pain and nausea and is resting in bed.  Sent message to G. Shalhoub, MD to notify of event.

## 2019-12-02 NOTE — TOC Transition Note (Signed)
Transition of Care [facility]) - CM/SW Discharge Note   Patient Details  Name: Teresa Camacho MRN: [id number] Date of Birth: [birth year]  Transition of Care [facility]) CM/SW Contact:  Bartholomew Crews, RN Phone Number: [phone] 12/02/2019, 2:11 PM   Clinical Narrative:     Notified by nursing that MD added 2 additional medications which have been sent electronically to [facility]. Previous NCM had issued a Match for TOC. Hard copy of Match provided to patient. Discussed that if Match letter doesn't work that it is because it was activated yesterday for other meds. Discussed cost of medications per goodrx - $19 (metoprolol tartrate $4, atorvastatin $15). Patient verbalized understanding. Patient to transition home today. No further TOC needs identified.  Final next level of care: Home/Self Care Barriers to Discharge: No Barriers Identified   Patient Goals and CMS Choice Patient states their goals for this hospitalization and ongoing recovery are:: return home CMS Medicare.gov Compare Post Acute Care list provided to:: Patient Choice offered to / list presented to : NA  Discharge Placement                       Discharge Plan and Services In-house Referral: NA Discharge Planning Services: CM Consult, [facility], [facility], Medication Assistance Post Acute Care Choice: NA          DME Arranged: N/A DME Agency: NA       HH Arranged: NA HH Agency: NA        Social Determinants of Health (SDOH) Interventions     Readmission Risk Interventions No flowsheet data found.

## 2019-12-02 NOTE — Discharge Summary (Signed)
Physician Discharge Summary  Teresa Camacho QIH:[id number] DOB: June 29, 1965 DOA: 11/30/2019  PCP: Patient, No Pcp Per  Admit date: 11/30/2019 Discharge date: 12/02/2019  Admitted From: Home Disposition:  Home  Discharge Condition:Stable CODE STATUS:FULL Diet recommendation:Carbohydrate consistent  Brief/Interim Summary: Patient is a 54 year old female with history of type 2 diabetes mellitus, OSA on CPAP, acid reflux who presents to the emergency department for the evaluation of upper back discomfort.  Reported 2 to 4 weeks of vague discomfort in upper back rating the shoulders.  She thinks that it is from her GERD.  Patient was previously taking Metformin but does not take any medications and she does not have a PCP.  On presentation her blood pressure was elevated, she was tachycardic.  CTA chest was negative for PE or any other acute abnormality.  Found to be hyper glycemic.  Hemoglobin A1c of more than 11.5.  Troponins were normal.  She was admitted for further management.  Social worker and diabetic coordinator  consulted. She has been started on insulin.  She has mild sinus tachycardia, TSH is normal.  She has been started on lisinopril and metoprolol.  I offered to do an echocardiogram but patient denied given her lack of insurance.  She wants to follow-up as an outpatient and do an echocardiogram if needed.  She is hemodynamically stable for discharge to home today.  Following problems were addressed during hospitalization:  Back pain/chest pain: Ongoing for 1 month.  Attributes to GERD.  Continue protonix.  EKG did not show any ischemic changes.  Serial troponins negative.  Normal BNP.  CT chest without any acute findings.  Continue supportive care.  Lidocaine patch on the upper back.  Hypertension: Does not have a PCP and does not take any medications.  Hypertensive on  presentation.  Started on lisinopril and metoprolol because patient also have persistent sinus tachycardia.  TSH is  normal.  Uncontrolled type 2 diabetes mellitus: Previously taking Metformin but not taking any medications for now.  Elevated blood sugars.  Hemoglobin A1c of 11.5.  Diabetic coordinator consulted.    She will be discharged on insulin and Metformin.  Elevated triglycerides: Triglyceride of 678-800.  LDL cudn tnot calculated.    Started on Lipitor 80 mg.  Recommended to check fasting lipid panel in 3 months  Uninsured: Does not have PCP.  [facility] up  Discharge Diagnoses:  Principal Problem:   Chest pain Active Problems:   Diabetes mellitus type II, non insulin dependent ([place])   Hypertensive urgency    Discharge Instructions  Discharge Instructions    Diet - low sodium heart healthy   Complete by: As directed    Discharge instructions   Complete by: As directed    1)Please monitor your blood sugars at home 2)Take prescribed medications as instructed 3)Follow up with [facility] on 12/26/2019 at 9:30 AM 4)Do a fasting lipid panel in 3 months   Increase activity slowly   Complete by: As directed      Allergies as of 12/02/2019      Reactions   Codeine Nausea And Vomiting   Pt is able to take percocet & vicodin      Medication List    STOP taking these medications   amoxicillin-clavulanate 875-125 MG tablet Commonly known as: AUGMENTIN   famotidine 20 MG tablet Commonly known as: PEPCID   HYDROmorphone 2 MG tablet Commonly known as: DILAUDID   ketorolac 10 MG  tablet Commonly known as: TORADOL   metoCLOPramide 10 MG tablet Commonly known as: REGLAN   miconazole 2 % cream Commonly known as: MICOTIN   naproxen sodium 220 MG tablet Commonly known as: ALEVE   ondansetron 8 MG disintegrating tablet Commonly known as: ZOFRAN-ODT     TAKE these medications   acetaminophen 500 MG tablet Commonly known as: TYLENOL Take 1,000 mg by mouth every 6 (six) hours as needed for mild  pain.   aspirin EC 81 MG tablet Take 1 tablet (81 mg total) by mouth daily. Swallow whole.   atorvastatin 80 MG tablet Commonly known as: Lipitor Take 1 tablet (80 mg total) by mouth daily.   blood glucose meter kit and supplies Kit Dispense based on patient and insurance preference. Use up to four times daily as directed. (FOR ICD-9 250.00, 250.01).   COQ10 PO Take 1 capsule by mouth every evening.   Insulin Pen Needle 32G X 4 MM Misc 1 each by Does not apply route in the morning and at bedtime.   Lantus SoloStar 100 UNIT/ML Solostar Pen Generic drug: insulin glargine Inject 20 Units into the skin daily.   lidocaine 5 % Commonly known as: LIDODERM Place 1 patch onto the skin daily. Remove & Discard patch within 12 hours or as directed by MD   lisinopril 10 MG tablet Commonly known as: ZESTRIL Take 1 tablet (10 mg total) by mouth daily.   loratadine 10 MG tablet Commonly known as: CLARITIN Take 10 mg by mouth daily.   MELATONIN PO Take 5 tablets by mouth daily as needed (sleep).   metFORMIN 500 MG tablet Commonly known as: Glucophage Take 1 tablet (500 mg total) by mouth 2 (two) times daily with a meal.   metoprolol tartrate 25 MG tablet Commonly known as: LOPRESSOR Take 1 tablet (25 mg total) by mouth 2 (two) times daily.   multivitamin with minerals tablet Take 1 tablet by mouth daily.   pantoprazole 40 MG tablet Commonly known as: Protonix Take 1 tablet (40 mg total) by mouth daily.   VITAMIN D PO Take 1 tablet by mouth daily.       Follow-up [facility] up on 12/26/2019.   Why: @ 9:30 with Dr. Wynetta Emery for hospital follow up appointment and new patient establishment. Pharmacy onsite and medications will range from $4.00-$10.00 Contact information: [place] [id number] [phone]             Allergies  Allergen Reactions  . Codeine Nausea And Vomiting     Pt is able to take percocet & vicodin    Consultations:  None   Procedures/Studies: CT Angio Chest PE W and/or Wo Contrast  Result Date: 11/30/2019 CLINICAL DATA:  Chest pain EXAM: CT ANGIOGRAPHY CHEST WITH CONTRAST TECHNIQUE: Multidetector CT imaging of the chest was performed using the standard protocol during bolus administration of intravenous contrast. Multiplanar CT image reconstructions and MIPs were obtained to evaluate the vascular anatomy. CONTRAST:  13m OMNIPAQUE IOHEXOL 350 MG/ML SOLN COMPARISON:  None. FINDINGS: Cardiovascular: This is a technically adequate evaluation of the pulmonary vasculature. There are no filling defects or pulmonary emboli. The heart is unremarkable without pericardial effusion. Normal caliber of the thoracic aorta. Mediastinum/Nodes: No enlarged mediastinal, hilar, or axillary lymph nodes. Thyroid gland, trachea, and esophagus demonstrate no significant findings. Lungs/Pleura: No airspace disease, effusion, or pneumothorax. Central airways are widely patent. Upper Abdomen: No acute abnormality. Musculoskeletal: No acute  or destructive bony lesions. Reconstructed images demonstrate no additional findings. Review of the MIP images confirms the above findings. IMPRESSION: 1. No evidence of pulmonary embolus. 2. No acute intrathoracic process. Electronically Signed   By: Randa Ngo M.D.   On: 11/30/2019 20:07       Subjective: Patient seen and examined at the bedside this morning.  Hemodynamically stable for discharge today.  Discharge Exam: Vitals:   12/02/19 0319 12/02/19 0810  BP: (!) 154/96 (!) 145/88  Pulse: (!) 104   Resp: 18   Temp: 98.4 F (36.9 C)   SpO2: 95%    Vitals:   12/02/19 0303 12/02/19 0304 12/02/19 0319 12/02/19 0810  BP:   (!) 154/96 (!) 145/88  Pulse: (!) 103 (!) 101 (!) 104   Resp:   18   Temp:   98.4 F (36.9 C)   TempSrc:   Oral   SpO2: 96% 96% 95%   Weight:   103.1 kg     General: Pt is alert, awake, not in  acute distress,obese Cardiovascular: Sinus tachycardia, no rubs, no gallops Respiratory: CTA bilaterally, no wheezing, no rhonchi Abdominal: Soft, NT, ND, bowel sounds + Extremities: no edema, no cyanosis    The results of significant diagnostics from this hospitalization (including imaging, microbiology, ancillary and laboratory) are listed below for reference.     Microbiology: Recent Results (from the past 240 hour(s))  SARS Coronavirus 2 by RT PCR (hospital order, performed in [facility] hospital lab) Nasopharyngeal Nasopharyngeal Swab     Status: None   Collection Time: 12/01/19 12:15 AM   Specimen: Nasopharyngeal Swab  Result Value Ref Range Status   SARS Coronavirus 2 NEGATIVE NEGATIVE Final    Comment: (NOTE) SARS-CoV-2 target nucleic acids are NOT DETECTED.  The SARS-CoV-2 RNA is generally detectable in upper and lower respiratory specimens during the acute phase of infection. The lowest concentration of SARS-CoV-2 viral copies this assay can detect is 250 copies / mL. A negative result does not preclude SARS-CoV-2 infection and should not be used as the sole basis for treatment or other patient management decisions.  A negative result may occur with improper specimen collection / handling, submission of specimen other than nasopharyngeal swab, presence of viral mutation(s) within the areas targeted by this assay, and inadequate number of viral copies (<250 copies / mL). A negative result must be combined with clinical observations, patient history, and epidemiological information.  Fact Sheet for Patients:   StrictlyIdeas.no  Fact Sheet for Healthcare Providers: BankingDealers.co.za  This test is not yet approved or  cleared by the [place] FDA and has been authorized for detection and/or diagnosis of SARS-CoV-2 by FDA under an Emergency Use Authorization (EUA).  This EUA will remain in effect (meaning this test  can be used) for the duration of the COVID-19 declaration under Section 564(b)(1) of the Act, 21 U.S.C. section 360bbb-3(b)(1), unless the authorization is terminated or revoked sooner.  Performed at [facility], [place] [address] [place], [place] [address]      Labs: BNP (last 3 results) Recent Labs    11/30/19 1847  BNP 19.1   Basic Metabolic Panel: Recent Labs  Lab 11/30/19 1845 11/30/19 2143 12/01/19 0537 12/02/19 0648  NA 136  --  137 137  K 3.7  --  3.9 3.8  CL 101  --  102 103  CO2 19*  --  21* 21*  GLUCOSE 306*  --  329* 323*  BUN 11  --  9  10  CREATININE 0.56  --  0.58 0.49  CALCIUM 9.1  --  8.6* 8.9  MG  --  1.8  --   --    Liver Function Tests: No results for input(s): AST, ALT, ALKPHOS, BILITOT, PROT, ALBUMIN in the last 168 hours. No results for input(s): LIPASE, AMYLASE in the last 168 hours. No results for input(s): AMMONIA in the last 168 hours. CBC: Recent Labs  Lab 11/30/19 1845 12/01/19 0537  WBC 8.5 7.9  HGB 15.2* 14.6  HCT 46.2* 45.0  MCV 87.8 88.6  PLT 257 286   Cardiac Enzymes: No results for input(s): CKTOTAL, CKMB, CKMBINDEX, TROPONINI in the last 168 hours. BNP: Invalid input(s): POCBNP CBG: Recent Labs  Lab 12/01/19 0753 12/01/19 1218 12/01/19 1615 12/01/19 2141 12/02/19 0744  GLUCAP 308* 299* 283* 285* 310*   D-Dimer No results for input(s): DDIMER in the last 72 hours. Hgb A1c Recent Labs    12/01/19 0537  HGBA1C 11.5*   Lipid Profile Recent Labs    12/01/19 0537 12/02/19 0648  CHOL 282* 290*  HDL 36* 36*  LDLCALC UNABLE TO CALCULATE IF TRIGLYCERIDE OVER 400 mg/dL UNABLE TO CALCULATE IF TRIGLYCERIDE OVER 400 mg/dL  TRIG 678* 840*  CHOLHDL 7.8 8.1  LDLDIRECT 112.6* 95.5   Thyroid function studies Recent Labs    11/30/19 2143  TSH 2.329   Anemia work up No results for input(s): VITAMINB12, FOLATE, FERRITIN, TIBC, IRON, RETICCTPCT in the last 72 hours. Urinalysis    Component Value Date/Time    COLORURINE RED (A) 06/04/2013 1410   APPEARANCEUR TURBID (A) 06/04/2013 1410   LABSPEC >1.030 (H) 06/04/2013 1410   PHURINE 5.0 06/04/2013 1410   GLUCOSEU NEGATIVE 06/04/2013 1410   HGBUR LARGE (A) 06/04/2013 1410   BILIRUBINUR NEGATIVE 06/04/2013 1410   KETONESUR 40 (A) 06/04/2013 1410   PROTEINUR 100 (A) 06/04/2013 1410   UROBILINOGEN 1.0 06/04/2013 1410   NITRITE POSITIVE (A) 06/04/2013 1410   LEUKOCYTESUR NEGATIVE 06/04/2013 1410   Sepsis Labs Invalid input(s): PROCALCITONIN,  WBC,  LACTICIDVEN Microbiology Recent Results (from the past 240 hour(s))  SARS Coronavirus 2 by RT PCR (hospital order, performed in [facility] hospital lab) Nasopharyngeal Nasopharyngeal Swab     Status: None   Collection Time: 12/01/19 12:15 AM   Specimen: Nasopharyngeal Swab  Result Value Ref Range Status   SARS Coronavirus 2 NEGATIVE NEGATIVE Final    Comment: (NOTE) SARS-CoV-2 target nucleic acids are NOT DETECTED.  The SARS-CoV-2 RNA is generally detectable in upper and lower respiratory specimens during the acute phase of infection. The lowest concentration of SARS-CoV-2 viral copies this assay can detect is 250 copies / mL. A negative result does not preclude SARS-CoV-2 infection and should not be used as the sole basis for treatment or other patient management decisions.  A negative result may occur with improper specimen collection / handling, submission of specimen other than nasopharyngeal swab, presence of viral mutation(s) within the areas targeted by this assay, and inadequate number of viral copies (<250 copies / mL). A negative result must be combined with clinical observations, patient history, and epidemiological information.  Fact Sheet for Patients:   StrictlyIdeas.no  Fact Sheet for Healthcare Providers: BankingDealers.co.za  This test is not yet approved or  cleared by the [place] FDA and has been authorized for  detection and/or diagnosis of SARS-CoV-2 by FDA under an Emergency Use Authorization (EUA).  This EUA will remain in effect (meaning this test can be used) for the duration of  the COVID-19 declaration under Section 564(b)(1) of the Act, 21 U.S.C. section 360bbb-3(b)(1), unless the authorization is terminated or revoked sooner.  Performed at [facility], [place] [employer] [employer] [address] [place], [place] [address]     Please note: You were cared for by a hospitalist during your hospital stay. Once you are discharged, your primary care physician will handle any further medical issues. Please note that NO REFILLS for any discharge medications will be authorized once you are discharged, as it is imperative that you return to your primary care physician (or establish a relationship with a primary care physician if you do not have one) for your post hospital discharge needs so that they can reassess your need for medications and monitor your lab values.    Time coordinating discharge: 40 minutes  SIGNED:   Shelly Coss, MD  [facility] 12/02/2019, 11:22 AM Pager [phone]  If 7PM-7AM, please contact night-coverage [email or web address] Password [email or web address]

## 2019-12-11 ENCOUNTER — Ambulatory Visit: Payer: Self-pay | Admitting: *Deleted

## 2019-12-11 ENCOUNTER — Telehealth: Payer: Self-pay | Admitting: Pharmacist

## 2019-12-11 NOTE — Telephone Encounter (Signed)
Received notification from [facility] that pt is concerned for hyperglycemia at home. Pt has a follow-up scheduled with Dr. Wynetta Emery to establish care on 12/26/2019.

## 2019-12-11 NOTE — Telephone Encounter (Signed)
Pt reports hospitalized 11/30/2019 discharged 12/02/19. States placed on Lantus and metformin. Pt has hospital F/U appt scheduled 12/26/19 with Dr. Wynetta Emery. Pt calling to report "I think my blood sugars are higher than they should be." States she is watching diet, keeping journal. No missed doses of meds. Morning BS ranging from 240-280s, during day 250s to 260. States occasional 300.   Denies any symptoms.  Pt questioning if meds should be adjusted. Assured pt NT would route to practice for PCPs review. CAre advise given per protocol, pt verbalizes understanding.   Please advise: [phone]  Reason for Disposition  [1] Caller has NON-URGENT medication or insulin pump question AND [2] triager unable to answer question  Answer Assessment - Initial Assessment Questions 1. BLOOD GLUCOSE: "What is your blood glucose level?"     244 2. ONSET: "When did you check the blood glucose?"      3. USUAL RANGE: "What is your glucose level usually?" (e.g., usual fasting morning value, usual evening value  AMs 240-280s   During day 250s-260s  Occasional 300    *4. KETONES: "Do you check for ketones (urine or blood test strips)?" If yes, ask: "What does the test show now?"      no 5. TYPE 1 or 2:  "Do you know what type of diabetes you have?"  (e.g., Type 1, Type 2, Gestational; doesn't know)      Type 2 6. INSULIN: "Do you take insulin?" "What type of insulin(s) do you use? What is the mode of delivery? (syringe, pen; injection or pump)?"      yes 7. DIABETES PILLS: "Do you take any pills for your diabetes?" If yes, ask: "Have you missed taking any pills recently?"     Yes, no missed doses 8. OTHER SYMPTOMS: "Do you have any symptoms?" (e.g., fever, frequent urination, difficulty breathing, dizziness, weakness, vomiting)     no  Protocols used: DIABETES - HIGH BLOOD SUGAR-A-AH

## 2019-12-18 ENCOUNTER — Telehealth: Payer: Self-pay | Admitting: Pharmacist

## 2019-12-18 ENCOUNTER — Ambulatory Visit: Payer: Self-pay | Admitting: *Deleted

## 2019-12-18 ENCOUNTER — Other Ambulatory Visit: Payer: Self-pay | Admitting: Pharmacist

## 2019-12-18 DIAGNOSIS — IMO0002 Reserved for concepts with insufficient information to code with codable children: Secondary | ICD-10-CM

## 2019-12-18 DIAGNOSIS — E1165 Type 2 diabetes mellitus with hyperglycemia: Secondary | ICD-10-CM

## 2019-12-18 MED ORDER — ACCU-CHEK SOFTCLIX LANCETS MISC: 2 refills | Status: DC

## 2019-12-18 MED ORDER — ACCU-CHEK GUIDE VI STRP
ORAL_STRIP | 2 refills | Status: DC
Start: 2019-12-18 — End: 2019-12-26

## 2019-12-18 NOTE — Telephone Encounter (Signed)
Rx sent for accu chek guide test strips and softclix lancets.

## 2019-12-18 NOTE — Telephone Encounter (Signed)
Teresa Camacho could you send in diabetic supplies

## 2019-12-18 NOTE — Telephone Encounter (Signed)
C/o hyperglycemia asymptomatic at this time. Blood gluocse this am prior to meal 336. Reports urinary frequency at hs, and increased thirst at hs. Denies rapid breathing, vomiting, nausea. Newly dx diabetic. Reports glucose monitor not working with supplies and is using her sons monitor to check glucose levels. Reports meter "would not work with my lancets" meter is labeled as  Accu check guide. Requesting new diabetic meter and supplies to be prescribed prior to scheduled 12/26/19 appt. Care advise given . Patient verbalized understanding of care advise and to seek help at urgent care or ED if symptoms worsen.  Reason for Disposition  [1] Blood glucose > 300 mg/dL (16.7 mmol/L) AND [2] uses insulin (e.g., insulin-dependent, all people with type 1 diabetes)  Answer Assessment - Initial Assessment Questions 1. BLOOD GLUCOSE: "What is your blood glucose level?"      336 this am 2. ONSET: "When did you check the blood glucose?"     Before breakfast 3. USUAL RANGE: "What is your glucose level usually?" (e.g., usual fasting morning value, usual evening value)     Morning 250-260 midday 240--280 bedtime 265-249 4. KETONES: "Do you check for ketones (urine or blood test strips)?" If yes, ask: "What does the test show now?"      na 5. TYPE 1 or 2:  "Do you know what type of diabetes you have?"  (e.g., Type 1, Type 2, Gestational; doesn't know)      Type 2 DM, newly dx.  6. INSULIN: "Do you take insulin?" "What type of insulin(s) do you use? What is the mode of delivery? (syringe, pen; injection or pump)?"      lantus pen 20 units once a day  7. DIABETES PILLS: "Do you take any pills for your diabetes?" If yes, ask: "Have you missed taking any pills recently?"     Metformin 500mg  po BID 8. OTHER SYMPTOMS: "Do you have any symptoms?" (e.g., fever, frequent urination, difficulty breathing, dizziness, weakness, vomiting)     na 9. PREGNANCY: "Is there any chance you are pregnant?" "When was your last  menstrual period?"     na  Protocols used: DIABETES - HIGH BLOOD SUGAR-A-AH

## 2019-12-18 NOTE — Telephone Encounter (Signed)
Will route to CMA 

## 2019-12-26 ENCOUNTER — Encounter: Payer: Self-pay | Admitting: Internal Medicine

## 2019-12-26 ENCOUNTER — Ambulatory Visit: Payer: Self-pay | Attending: Internal Medicine | Admitting: Internal Medicine

## 2019-12-26 ENCOUNTER — Other Ambulatory Visit: Payer: Self-pay | Admitting: Internal Medicine

## 2019-12-26 ENCOUNTER — Ambulatory Visit (HOSPITAL_BASED_OUTPATIENT_CLINIC_OR_DEPARTMENT_OTHER): Payer: Self-pay | Admitting: Pharmacist

## 2019-12-26 ENCOUNTER — Other Ambulatory Visit: Payer: Self-pay

## 2019-12-26 VITALS — BP 118/76 | HR 80 | Resp 16 | Ht 62.0 in | Wt 231.4 lb

## 2019-12-26 DIAGNOSIS — L649 Androgenic alopecia, unspecified: Secondary | ICD-10-CM

## 2019-12-26 DIAGNOSIS — E785 Hyperlipidemia, unspecified: Secondary | ICD-10-CM

## 2019-12-26 DIAGNOSIS — E1169 Type 2 diabetes mellitus with other specified complication: Secondary | ICD-10-CM

## 2019-12-26 DIAGNOSIS — I10 Essential (primary) hypertension: Secondary | ICD-10-CM

## 2019-12-26 DIAGNOSIS — Z6841 Body Mass Index (BMI) 40.0 and over, adult: Secondary | ICD-10-CM

## 2019-12-26 DIAGNOSIS — Z23 Encounter for immunization: Secondary | ICD-10-CM

## 2019-12-26 DIAGNOSIS — K219 Gastro-esophageal reflux disease without esophagitis: Secondary | ICD-10-CM

## 2019-12-26 DIAGNOSIS — Z794 Long term (current) use of insulin: Secondary | ICD-10-CM

## 2019-12-26 DIAGNOSIS — Z8371 Family history of colonic polyps: Secondary | ICD-10-CM

## 2019-12-26 DIAGNOSIS — Z889 Allergy status to unspecified drugs, medicaments and biological substances status: Secondary | ICD-10-CM

## 2019-12-26 DIAGNOSIS — G4733 Obstructive sleep apnea (adult) (pediatric): Secondary | ICD-10-CM

## 2019-12-26 DIAGNOSIS — Z9989 Dependence on other enabling machines and devices: Secondary | ICD-10-CM

## 2019-12-26 DIAGNOSIS — E1165 Type 2 diabetes mellitus with hyperglycemia: Secondary | ICD-10-CM

## 2019-12-26 DIAGNOSIS — Z1231 Encounter for screening mammogram for malignant neoplasm of breast: Secondary | ICD-10-CM

## 2019-12-26 DIAGNOSIS — Z83719 Family history of colon polyps, unspecified: Secondary | ICD-10-CM

## 2019-12-26 LAB — GLUCOSE, POCT (MANUAL RESULT ENTRY): POC Glucose: 332 mg/dl — AB (ref 70–99)

## 2019-12-26 MED ORDER — METOPROLOL TARTRATE 25 MG PO TABS: 25.0000 mg | ORAL_TABLET | Freq: Two times a day (BID) | ORAL | 6 refills | Status: DC

## 2019-12-26 MED ORDER — PANTOPRAZOLE SODIUM 40 MG PO TBEC: 40.0000 mg | DELAYED_RELEASE_TABLET | Freq: Every day | ORAL | 6 refills | Status: DC

## 2019-12-26 MED ORDER — ASPIRIN EC 81 MG PO TBEC: 81.0000 mg | DELAYED_RELEASE_TABLET | Freq: Every day | ORAL | 2 refills | Status: AC

## 2019-12-26 MED ORDER — LANTUS SOLOSTAR 100 UNIT/ML ~~LOC~~ SOPN: 25.0000 [IU] | PEN_INJECTOR | Freq: Every day | SUBCUTANEOUS | 6 refills | Status: DC

## 2019-12-26 MED ORDER — METFORMIN HCL 1000 MG PO TABS: 1000.0000 mg | ORAL_TABLET | Freq: Two times a day (BID) | ORAL | 6 refills | Status: DC

## 2019-12-26 MED ORDER — LISINOPRIL 10 MG PO TABS: 10.0000 mg | ORAL_TABLET | Freq: Every day | ORAL | 6 refills | Status: DC

## 2019-12-26 MED ORDER — TRUE METRIX METER W/DEVICE KIT: PACK | 0 refills | Status: DC

## 2019-12-26 MED ORDER — ATORVASTATIN CALCIUM 80 MG PO TABS: 80.0000 mg | ORAL_TABLET | Freq: Every day | ORAL | 6 refills | Status: DC

## 2019-12-26 MED ORDER — TRUE METRIX BLOOD GLUCOSE TEST VI STRP: ORAL_STRIP | 12 refills | Status: DC

## 2019-12-26 MED ORDER — LORATADINE 10 MG PO TABS: 10.0000 mg | ORAL_TABLET | Freq: Every day | ORAL | 6 refills | Status: DC

## 2019-12-26 MED ORDER — TRUEPLUS LANCETS 28G MISC: 4 refills | Status: DC

## 2019-12-26 MED ORDER — INSULIN PEN NEEDLE 32G X 4 MM MISC: 1.0000 | Freq: Two times a day (BID) | 1 refills | Status: DC

## 2019-12-26 MED FILL — METOPROLOL TARTRATE 25 MG T: 25 | 30 days supply | Qty: 60 | Fill #0

## 2019-12-26 MED FILL — TRUE METRIX GO GLUCOSE METE: W/DEVICE | 30 days supply | Qty: 1 | Fill #0

## 2019-12-26 MED FILL — ATORVASTATIN 80 MG TABLET: 80 | 30 days supply | Qty: 30 | Fill #0

## 2019-12-26 MED FILL — !LANTUS SOLOSTAR 100UNITS/M: 100 | 24 days supply | Qty: 6 | Fill #0

## 2019-12-26 MED FILL — TRUE METRIX TEST STRIP: 30 days supply | Qty: 100 | Fill #0

## 2019-12-26 MED FILL — TRUEplus 5-BEVEL PEN NEEDLE: 32G X 4 MM | 50 days supply | Qty: 100 | Fill #0

## 2019-12-26 MED FILL — TRUEplus LANCETS 28G MISC: 30 days supply | Qty: 100 | Fill #0

## 2019-12-26 NOTE — Patient Instructions (Addendum)
Please call the [facility] (breast and cervical cancer control program) at [phone] to schedule diagnostic mammogram   Your diabetes is not controlled as yet.  Our goal is to get your blood sugars before meals between 90-130.  If you do check blood sugars after meals, checking 2 hours after meals and the goal is to be less than 180.  We have increase Lantus insulin to 25 units daily and Metformin 2000 mg twice a day.  He will follow up with the clinical pharmacist in 2 weeks.  Please bring your blood sugar readings with you so that your medication can be further adjusted.  Try to get an eye exam done at least once a year given that you have diabetes.  Please apply for the orange card/cone discount card so that we can refer you to the gastroenterologist in the future for a colonoscopy.  We can also refer you to an allergist once you had the orange card/cone discount card.   Diabetes Mellitus and Standards of Medical Care Managing diabetes (diabetes mellitus) can be complicated. Your diabetes treatment may be managed by a team of health care providers, including:  A physician who specializes in diabetes (endocrinologist).  A nurse practitioner or physician assistant.  Nurses.  A diet and nutrition specialist (registered dietitian).  A certified diabetes educator (CDE).  An exercise specialist.  A pharmacist.  An eye doctor.  A foot specialist (podiatrist).  A dentist.  A primary care provider.  A mental health provider. Your health care providers follow guidelines to help you get the best quality of care. The following schedule is a general guideline for your diabetes management plan. Your health care providers may give you more specific instructions. Physical exams Upon being diagnosed with diabetes mellitus, and each year after that, your health care provider will ask about your medical and family history. He or she will also do a physical exam. Your exam may  include:  Measuring your height, weight, and body mass index (BMI).  Checking your blood pressure. This will be done at every routine medical visit. Your target blood pressure may vary depending on your medical conditions, your age, and other factors.  Thyroid gland exam.  Skin exam.  Screening for damage to your nerves (peripheral neuropathy). This may include checking the pulse in your legs and feet and checking the level of sensation in your hands and feet.  A complete foot exam to inspect the structure and skin of your feet, including checking for cuts, bruises, redness, blisters, sores, or other problems.  Screening for blood vessel (vascular) problems, which may include checking the pulse in your legs and feet and checking your temperature. Blood tests Depending on your treatment plan and your personal needs, you may have the following tests done:  HbA1c (hemoglobin A1c). This test provides information about blood sugar (glucose) control over the previous 2-3 months. It is used to adjust your treatment plan, if needed. This test will be done: ? At least 2 times a year, if you are meeting your treatment goals. ? 4 times a year, if you are not meeting your treatment goals or if treatment goals have changed.  Lipid testing, including total, LDL, and HDL cholesterol and triglyceride levels. ? The goal for LDL is less than 100 mg/dL (5.5 mmol/L). If you are at high risk for complications, the goal is less than 70 mg/dL (3.9 mmol/L). ? The goal for HDL is 40 mg/dL (2.2 mmol/L) or higher for men and 50 mg/dL (2.8  mmol/L) or higher for women. An HDL cholesterol of 60 mg/dL (3.3 mmol/L) or higher gives some protection against heart disease. ? The goal for triglycerides is less than 150 mg/dL (8.3 mmol/L).  Liver function tests.  Kidney function tests.  Thyroid function tests. Dental and eye exams  Visit your dentist two times a year.  If you have type 1 diabetes, your health care  provider may recommend an eye exam 3-5 years after you are diagnosed, and then once a year after your first exam. ? For children with type 1 diabetes, a health care provider may recommend an eye exam when your child is age 100 or older and has had diabetes for 3-5 years. After the first exam, your child should get an eye exam once a year.  If you have type 2 diabetes, your health care provider may recommend an eye exam as soon as you are diagnosed, and then once a year after your first exam. Immunizations   The yearly flu (influenza) vaccine is recommended for everyone 6 months or older who has diabetes.  The pneumonia (pneumococcal) vaccine is recommended for everyone 2 years or older who has diabetes. If you are 39 or older, you may get the pneumonia vaccine as a series of two separate shots.  The hepatitis B vaccine is recommended for adults shortly after being diagnosed with diabetes.  Adults and children with diabetes should receive all other vaccines according to age-specific recommendations from the Centers for Disease Control and Prevention (CDC). Mental and emotional health Screening for symptoms of eating disorders, anxiety, and depression is recommended at the time of diagnosis and afterward as needed. If your screening shows that you have symptoms (positive screening result), you may need more evaluation and you may work with a mental health care provider. Treatment plan Your treatment plan will be reviewed at every medical visit. You and your health care provider will discuss:  How you are taking your medicines, including insulin.  Any side effects you are experiencing.  Your blood glucose target goals.  The frequency of your blood glucose monitoring.  Lifestyle habits, such as activity level as well as tobacco, alcohol, and substance use. Diabetes self-management education Your health care provider will assess how well you are monitoring your blood glucose levels and whether  you are taking your insulin correctly. He or she may refer you to:  A certified diabetes educator to manage your diabetes throughout your life, starting at diagnosis.  A registered dietitian who can create or review your personal nutrition plan.  An exercise specialist who can discuss your activity level and exercise plan. Summary  Managing diabetes (diabetes mellitus) can be complicated. Your diabetes treatment may be managed by a team of health care providers.  Your health care providers follow guidelines in order to help you get the best quality of care.  Standards of care including having regular physical exams, blood tests, blood pressure monitoring, immunizations, screening tests, and education about how to manage your diabetes.  Your health care providers may also give you more specific instructions based on your individual health. This information is not intended to replace advice given to you by your health care provider. Make sure you discuss any questions you have with your health care provider. Document Revised: 01/21/2018 Document Reviewed: 01/31/2016 Elsevier Patient Education  [place].

## 2019-12-26 NOTE — Progress Notes (Signed)
Patient presents for vaccination against strep pneumo per orders of Dr. Wynetta Emery. Consent given. Counseling provided. No contraindications exists. Vaccine administered without incident. Will coordinate with pharmacy for patient assistance.

## 2019-12-26 NOTE — Progress Notes (Signed)
Patient ID: TAMETRA AHART, female    DOB: Apr 20, 1966  MRN: [id number]  CC: Hospitalization Follow-up   Subjective: Teresa Camacho is a 54 y.o. female who presents for new patient visit and hospital follow-up. Her concerns today include:  Patient with history of DM type II, HTN, HL, OSA on CPAP, GERD,PCOS, pattern baldness  Patient with no previous PCP in the past 4-5 yrs.  Last PCP had retired. Patient hospitalized 7/15-17/2021 with complaint of 1 month of upper back discomfort that she thought was due to acid reflux.  She was found to be hypertensive, tachycardic and hyperglycemic with an A1c of 11.5.  CTA of the chest was negative for PE or any other acute abnormality, troponins and BNP normal, EKG without ischemic changes, TSH normal.  Echo was offered but patient declined due to lack of insurance.  Patient was prescribed Protonix and lidocaine patch for the upper back pain.  Started on lisinopril and metoprolol for the blood pressure and persistent sinus tachycardia.  For the uncontrolled diabetes, she was placed on Metformin and Lantus insulin and Lipitor 80 mg for the hyperlipidemia.  Today:  DIABETES TYPE 2/Obesity Last A1C:   Results for orders placed or performed in visit on 12/26/19  POCT glucose (manual entry)  Result Value Ref Range   POC Glucose 332 (A) 70 - 99 mg/dl    Lab Results  Component Value Date   HGBA1C 11.5 (H) 12/01/2019  Med Adherence:  _0  Yes    _1  No Medication side effects:  _2  Yes    _3  No Home Monitoring?  _4  Yes  203 x a day but she noted a discrepency b/w the readings of  Home glucose results range:before BF 260-290s, before bedtime 250-286 Diet Adherence: she did meet with the nutritionist while in hospital. Found it helpful. She has subsequently done a lot of reading and doing meal planning. She is avoiding prepackaged foods, reduced carbs and sweet things.  Drinks mainly water and occasion diet drink.  Feels hungry all the time. Exercise: _5  Yes -  walking 4-5 x a wk, some aerobics with hand wghs. Hypoglycemic episodes?: _6  Yes    _7  No Numbness of the feet? _8  Yes    _9  No Retinopathy hx? _10  Yes    _11  No Last eye exam:  Some blurred vision but not different.  Wears readers.  No recent eye exam Comments:   HYPERTENSION Currently taking: see medication list Med Adherence: _12  Yes    _13  No Medication side effects: _14  Yes    _15  No Adherence with salt restriction: _16  Yes    _17  No Home Monitoring?: _18  Yes    _19  No Monitoring Frequency: _20  Yes    _21  No Home BP results range: _22  Yes    _23  No SOB? _24  Yes    _25  No Chest Pain?: _26  Yes    _27  No Leg swelling?: _28  Yes    _29  No Headaches?: _30  Yes    _31  No Dizziness? _32  Yes    _33  No Comments:   HL: taking and tolerating Lipitor  OSA:  Uses CPAP consistently.  Doing well with it.  Allergies: takes Claritin daily and Benadryl at nights.  Hx of hives and intermittent throat itching for yrs.  .Not sure what she is allergic to.  Saw an allergist 5 yrs ago and had allergy testing.  Cause not found. Use to have Epi Pen.   HM: Sister who has had several polyps removed.  Mother past  with metastatic colon cancer at age 21.  Never had colonoscopy.  She had hysterectomy due to fibroids.  Ovaries were left in place.  Past medical, surgical, family history and social histories were reviewed.   Patient Active Problem List   Diagnosis Date Noted  . 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  . History of allergy 12/26/2019  . OSA on CPAP 12/26/2019  . Class 3 severe obesity due to excess calories with serious comorbidity and body mass index (BMI) of 40.0 to 44.9 in adult ([place]) 12/26/2019  . Hyperlipidemia associated with type 2 diabetes mellitus ([place]) 12/26/2019  . Essential hypertension 12/26/2019  . Family history of colonic polyps 12/26/2019  . Gastroesophageal reflux disease without esophagitis 12/26/2019  . Chest pain 11/30/2019  . Type 2 diabetes mellitus with hyperglycemia, with long-term  current use of insulin ([place]) 11/30/2019  . Hypertensive urgency 11/30/2019  . Iron deficiency anemia due to chronic blood loss 06/28/2013  . S/P hysterectomy 06/13/2013  . Dysfunctional uterine bleeding 05/25/2013  . Fibroids, submucosal 05/03/2013  . Abnormal uterine bleeding 04/17/2013     Current Outpatient Medications on File Prior to Visit  Medication Sig Dispense Refill  . acetaminophen (TYLENOL) 500 MG tablet Take 1,000 mg by mouth every 6 (six) hours as needed for mild pain.    . blood glucose meter kit and supplies KIT Dispense based on patient and insurance preference. Use up to four times daily as directed. (FOR ICD-9 250.00, 250.01). 1 each 0  . Coenzyme Q10 (COQ10 PO) Take 1 capsule by mouth every evening.    . lidocaine (LIDODERM) 5 % Place 1 patch onto the skin daily. Remove & Discard patch within 12 hours or as directed by MD 30 patch 0  . MELATONIN PO Take 5 tablets by mouth daily as needed (sleep).     . Multiple Vitamins-Minerals (MULTIVITAMIN WITH MINERALS) tablet Take 1 tablet by mouth daily.    Marland Kitchen VITAMIN D PO Take 1 tablet by mouth daily.     No current facility-administered medications on file prior to visit.    Allergies  Allergen Reactions  . Codeine Nausea And Vomiting    Pt is able to take percocet & vicodin    Social History   Socioeconomic History  . Marital status: Divorced    Spouse name: Not on file  . Number of children: 5  . Years of education: Not on file  . Highest education level: Not on file  Occupational History  . Not on file  Tobacco Use  . Smoking status: Never Smoker  . Smokeless tobacco: Never Used  Vaping Use  . Vaping Use: Never used  Substance and Sexual Activity  . Alcohol use: No  . Drug use: No  . Sexual activity: Never  Other Topics Concern  . Not on file  Social History Narrative  . Not on file   Social Determinants of Health   Financial Resource Strain:   . Difficulty of Paying Living Expenses:   Food  Insecurity:   . Worried About Charity fundraiser in the Last Year:   . Arboriculturist in the Last Year:   Transportation Needs:   . Film/video editor (Medical):   Marland Kitchen Lack of Transportation (Non-Medical):   Physical Activity:   . Days of Exercise per Week:   . Minutes of Exercise per Session:   Stress:   . Feeling of Stress :   Social Connections:   . Frequency of Communication with Friends and Family:   .  Frequency of Social Gatherings with Friends and Family:   . Attends Religious Services:   . Active Member of Clubs or Organizations:   . Attends Archivist Meetings:   Marland Kitchen Marital Status:   Intimate Partner Violence:   . Fear of Current or Ex-Partner:   . Emotionally Abused:   Marland Kitchen Physically Abused:   . Sexually Abused:     Family History  Problem Relation Age of Onset  . Cancer Mother   . Depression Mother   . Diabetes Mother   . Arthritis Mother   . Heart disease Mother   . Learning disabilities Mother   . Mental illness Mother   . Cancer Father   . Arthritis Father   . Cancer Maternal Aunt        x 2 Aunts    Past Surgical History:  Procedure Laterality Date  . ABDOMINAL HYSTERECTOMY N/A 06/13/2013   Procedure: HYSTERECTOMY ABDOMINAL and removal of vaginal skin tag;  Surgeon: Luz Lex, MD;  Location: [facility];  Service: Gynecology;  Laterality: N/A;  . APPENDECTOMY    . CESAREAN SECTION     3x  . CHOLECYSTECTOMY    . CHOLECYSTECTOMY  1991    ROS: Review of Systems  Skin:       She is noted to have female pattern baldness.  Patient states that this started in her 43s and she thinks it may have been caused by history of PCOS.     PHYSICAL EXAM: BP 118/76   Pulse 80   Resp 16   Ht _0  (1.575 m)   Wt 231 lb 6.4 oz (105 kg)   LMP 04/17/2013   SpO2 96%   BMI 42.32 kg/m   Wt Readings from Last 3 Encounters:  12/26/19 231 lb 6.4 oz (105 kg)  12/02/19 227 lb 3.2 oz (103.1 kg)  11/05/15 230 lb (104.3 kg)    Physical Exam  General  appearance - alert, well appearing, middle age 51 female and in no distress Mental status - normal mood, behavior, speech, dress, motor activity, and thought processes Eyes - pupils equal and reactive, extraocular eye movements intact Nose - normal and patent, no erythema, discharge or polyps Mouth - mucous membranes moist, pharynx normal without lesions Neck - supple, no significant adenopathy Lymphatics - no palpable lymphadenopathy, no hepatosplenomegaly Chest - clear to auscultation, no wheezes, rales or rhonchi, symmetric air entry Heart - normal rate, regular rhythm, normal S1, S2, no murmurs, rubs, clicks or gallops Extremities - peripheral pulses normal, no pedal edema, no clubbing or cyanosis Skin -thin brittle hair with female pattern baldness and receding hairline Diabetic Foot Exam - Simple   Simple Foot Form Visual Inspection See comments: Yes Sensation Testing Intact to touch and monofilament testing bilaterally: Yes Pulse Check Posterior Tibialis and Dorsalis pulse intact bilaterally: Yes Comments Small callous medial aspect RT big toe.      CMP Latest Ref Rng & Units 12/02/2019 12/01/2019 11/30/2019  Glucose 70 - 99 mg/dL 323(H) 329(H) 306(H)  BUN 6 - 20 mg/dL _1 Creatinine 0.44 - 1.00 mg/dL 0.49 0.58 0.56  Sodium 135 - 145 mmol/L 137 137 136  Potassium 3.5 - 5.1 mmol/L 3.8 3.9 3.7  Chloride 98 - 111 mmol/L 103 102 101  CO2 22 - 32 mmol/L 21(L) 21(L) 19(L)  Calcium 8.9 - 10.3 mg/dL 8.9 8.6(L) 9.1  Total Protein 6.0 - 8.3 g/dL - - -  Total Bilirubin 0.3 - 1.2 mg/dL - - -  Alkaline Phos 39 - 117 U/L - - -  AST 0 - 37 U/L - - -  ALT 0 - 35 U/L - - -   Lipid Panel     Component Value Date/Time   CHOL 290 (H) 12/02/2019 0648   TRIG 840 (H) 12/02/2019 0648   HDL 36 (L) 12/02/2019 0648   CHOLHDL 8.1 12/02/2019 0648   VLDL UNABLE TO CALCULATE IF TRIGLYCERIDE OVER 400 mg/dL 12/02/2019 0648   LDLCALC UNABLE TO CALCULATE IF TRIGLYCERIDE OVER 400 mg/dL  12/02/2019 0648   LDLDIRECT 95.5 12/02/2019 0648    CBC    Component Value Date/Time   WBC 7.9 12/01/2019 0537   RBC 5.08 12/01/2019 0537   HGB 14.6 12/01/2019 0537   HCT 45.0 12/01/2019 0537   PLT 286 12/01/2019 0537   MCV 88.6 12/01/2019 0537   MCH 28.7 12/01/2019 0537   MCHC 32.4 12/01/2019 0537   RDW 13.6 12/01/2019 0537   LYMPHSABS 3.2 06/14/2013 2217   MONOABS 1.0 06/14/2013 2217   EOSABS 0.4 06/14/2013 2217   BASOSABS 0.0 06/14/2013 2217    ASSESSMENT AND PLAN:  1. Type 2 diabetes mellitus with hyperglycemia, with long-term current use of insulin (HCC) Uncontrolled. Discussed the importance of healthy eating habits, regular aerobic exercise (at least 150 minutes a week as tolerated) and medication compliance to achieve or maintain control of diabetes. -Commended her on changes that she has made in her eating habits so far. -Increase Lantus to 25 units daily and Metformin to 1000 mg twice a day.  Went over signs and symptoms of hypoglycemia and how to treat.  Went over blood sugar goals before and 2 hours after meals. -Advised to check blood sugars twice a day before meals and bring in readings in about 2 weeks to see our clinical pharmacist. - POCT glucose (manual entry) - Microalbumin / creatinine urine ratio - glucose blood (TRUE METRIX BLOOD GLUCOSE TEST) test strip; Use as instructed  Dispense: 100 each; Refill: 12 - Blood Glucose Monitoring Suppl (TRUE METRIX METER) w/Device KIT; Use as directed  Dispense: 1 kit; Refill: 0 - TRUEplus Lancets 28G MISC; Use as directed  Dispense: 100 each; Refill: 4 - metFORMIN (GLUCOPHAGE) 1000 MG tablet; Take 1 tablet (1,000 mg total) by mouth 2 (two) times daily with a meal.  Dispense: 60 tablet; Refill: 6 - insulin glargine (LANTUS SOLOSTAR) 100 UNIT/ML Solostar Pen; Inject 25 Units into the skin daily.  Dispense: 15 mL; Refill: 6 - Insulin Pen Needle 32G X 4 MM MISC; 1 each by Does not apply route in the morning and at bedtime.   Dispense: 100 each; Refill: 1  2. Essential hypertension At goal of 130/80 or lower.  Continue metoprolol.  Advised that a potential side effect of lisinopril is angioedema which can involve the lips or tongue or the throat.  If she has any such episode, we will need to discontinue the lisinopril.  I particularly informed her of this given her history of allergies. - metoprolol tartrate (LOPRESSOR) 25 MG tablet; Take 1 tablet (25 mg total) by mouth 2 (two) times daily.  Dispense: 60 tablet; Refill: 6 - lisinopril (ZESTRIL) 10 MG tablet; Take 1 tablet (10 mg total) by mouth daily.  Dispense: 30 tablet; Refill: 6 - aspirin EC 81 MG tablet; Take 1 tablet (81 mg total) by mouth daily. Swallow whole.  Dispense: 100 tablet; Refill: 2  3. Hyperlipidemia associated with type 2 diabetes mellitus ([place]) -Went over potential side effects of Lipitor including drug-induced  hepatitis and muscle aches/cramps.  She will let me know if she experiences any aching muscles.  Last LFT normal. - atorvastatin (LIPITOR) 80 MG tablet; Take 1 tablet (80 mg total) by mouth daily.  Dispense: 30 tablet; Refill: 6 - aspirin EC 81 MG tablet; Take 1 tablet (81 mg total) by mouth daily. Swallow whole.  Dispense: 100 tablet; Refill: 2  4. Class 3 severe obesity due to excess calories with serious comorbidity and body mass index (BMI) of 40.0 to 44.9 in adult [facility]) See #1 above  5. Encounter for screening mammogram for malignant neoplasm of breast - MM Digital Screening; Future  6. OSA on CPAP Doing well on CPAP.  Encouraged her to continue compliance.  7. History of allergy She will continue the Claritin in the morning and Benadryl in the evening.  I offered prescription for EpiPen but patient declined.  She will apply for the orange card/cone discount card and we can refer her to an allergist once she is approved. - loratadine (CLARITIN) 10 MG tablet; Take 1 tablet (10 mg total) by mouth daily.  Dispense: 30 tablet; Refill:  6  8. Female pattern baldness  9. Need for vaccination against Streptococcus pneumoniae Given today  10. Family history of colonic polyps We will refer for colonoscopy once she has been approved for the orange card/cone discount  11. Gastroesophageal reflux disease without esophagitis - pantoprazole (PROTONIX) 40 MG tablet; Take 1 tablet (40 mg total) by mouth daily.  Dispense: 30 tablet; Refill: 6  I spent 45 minutes with this patient in direct face-to-face evaluation reviewing chart, obtaining history and doing physical exam, discussing assessment and plan and coordinating care. Patient was given the opportunity to ask questions.  Patient verbalized understanding of the plan and was able to repeat key elements of the plan.   Orders Placed This Encounter  Procedures  . MM Digital Screening  . Microalbumin / creatinine urine ratio  . POCT glucose (manual entry)     Requested Prescriptions   Signed Prescriptions Disp Refills  . glucose blood (TRUE METRIX BLOOD GLUCOSE TEST) test strip 100 each 12    Sig: Use as instructed  . Blood Glucose Monitoring Suppl (TRUE METRIX METER) w/Device KIT 1 kit 0    Sig: Use as directed  . TRUEplus Lancets 28G MISC 100 each 4    Sig: Use as directed  . metFORMIN (GLUCOPHAGE) 1000 MG tablet 60 tablet 6    Sig: Take 1 tablet (1,000 mg total) by mouth 2 (two) times daily with a meal.  . insulin glargine (LANTUS SOLOSTAR) 100 UNIT/ML Solostar Pen 15 mL 6    Sig: Inject 25 Units into the skin daily.  . metoprolol tartrate (LOPRESSOR) 25 MG tablet 60 tablet 6    Sig: Take 1 tablet (25 mg total) by mouth 2 (two) times daily.  . pantoprazole (PROTONIX) 40 MG tablet 30 tablet 6    Sig: Take 1 tablet (40 mg total) by mouth daily.  Marland Kitchen atorvastatin (LIPITOR) 80 MG tablet 30 tablet 6    Sig: Take 1 tablet (80 mg total) by mouth daily.  Marland Kitchen lisinopril (ZESTRIL) 10 MG tablet 30 tablet 6    Sig: Take 1 tablet (10 mg total) by mouth daily.  Marland Kitchen aspirin EC 81 MG  tablet 100 tablet 2    Sig: Take 1 tablet (81 mg total) by mouth daily. Swallow whole.  . loratadine (CLARITIN) 10 MG tablet 30 tablet 6    Sig: Take 1 tablet (10  mg total) by mouth daily.  . Insulin Pen Needle 32G X 4 MM MISC 100 each 1    Sig: 1 each by Does not apply route in the morning and at bedtime.    Return in about 7 weeks (around 02/13/2020) for 2 weeks with Lurena Joiner for follow on blood sugars.Karle Plumber, MD, FACP

## 2019-12-27 LAB — MICROALBUMIN / CREATININE URINE RATIO
Creatinine, Urine: 58.5 mg/dL
Microalb/Creat Ratio: 231 mg/g creat — ABNORMAL HIGH (ref 0–29)
Microalbumin, Urine: 135 ug/mL

## 2019-12-27 NOTE — Progress Notes (Signed)
Let pt know that she has a small amount of protein in the urine.  This could be an indication of DM affecting the kidney.  The Lisinopril will help to protect the kidneys in people with diabetes.

## 2020-01-08 NOTE — Progress Notes (Signed)
   S:  PCP: None in Epic, per pt new PCP will be Dr. Wynetta Emery  Chief Complaint  Patient presents with  . Diabetes   Patient arrives in no acute distress. Presents for diabetes evaluation, education, and management. Patient was last seen by Dr. Wynetta Emery and referred on 12/26/19. At that visit, metformin and lantus increased.  Pertinent PMH: cholecystectomy  T2DM diagnosed 11/2019.   Insurance coverage/medication affordability: self-play, GoodRx Gold  Current diabetes medications:  1. Metformin 1000 mg BID 2. Lantus 25 units daily  Adverse effects: denies any side effects Prior diabetes med trials: none  Hypoglycemia: - Denies any BG < 70  Lifestyle: Keeps food journal, has made many positive diet changes. Avoids sweets, white carbs. Eats vegetables, proteins. Met with dietician when she was in the hospital in the past.  Exercise: 4-5x/week some aerobics with hand weights  DM Comorbidities: Gastroparesis: negative; denies early satiety, no GI complaints   Current hypertension medications include: lisinopril 10 mg daily, metoprolol 25 mg BID Current hyperlipidemia medications include: atorvastatin 80 mg daily  O:  POCT BG today: 298  Lab Results  Component Value Date   HGBA1C 11.5 (H) 12/01/2019   There were no vitals filed for this visit.  Home BG Readings FBG: 200-low 300s, none < 200s or > 400  Microalbumin/Creatinine Urine Ratio: 231 (12/26/19)  Lipid Panel     Component Value Date/Time   CHOL 290 (H) 12/02/2019 0648   TRIG 840 (H) 12/02/2019 0648   HDL 36 (L) 12/02/2019 0648   CHOLHDL 8.1 12/02/2019 0648   VLDL UNABLE TO CALCULATE IF TRIGLYCERIDE OVER 400 mg/dL 12/02/2019 0648   LDLCALC UNABLE TO CALCULATE IF TRIGLYCERIDE OVER 400 mg/dL 12/02/2019 0648   LDLDIRECT 95.5 12/02/2019 0648    Clinical Atherosclerotic Cardiovascular Disease (ASCVD): No  The 10-year ASCVD risk score Mikey Bussing DC Jr., et al., 2013) is: 8.6%   Values used to calculate the score:      Age: 85 years     Sex: Female     Is Non-Hispanic African American: No     Diabetic: Yes     Tobacco smoker: No     Systolic Blood Pressure: 341 mmHg     Is BP treated: Yes     HDL Cholesterol: 36 mg/dL     Total Cholesterol: 290 mg/dL    A: 1. Diabetes - Most recent A1c not at goal < 7%, next due 02/2020 - Clinic BG today and home BG readings not at goal - Tolerating current medications, no hypoglycemia - Would benefit from GLP1 to assist with weight loss. History of cholcystectomy, not a contraindication but will proceed with caution. Discussed possible adverse effects, mechanism of action, administration technique. 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

## 2020-01-09 ENCOUNTER — Encounter: Payer: Self-pay | Admitting: Pharmacist

## 2020-01-09 ENCOUNTER — Ambulatory Visit: Payer: Self-pay | Attending: Family Medicine | Admitting: Pharmacist

## 2020-01-09 ENCOUNTER — Other Ambulatory Visit: Payer: Self-pay

## 2020-01-09 DIAGNOSIS — E1165 Type 2 diabetes mellitus with hyperglycemia: Secondary | ICD-10-CM

## 2020-01-09 DIAGNOSIS — Z794 Long term (current) use of insulin: Secondary | ICD-10-CM

## 2020-01-09 LAB — GLUCOSE, POCT (MANUAL RESULT ENTRY): POC Glucose: 298 mg/dl — AB (ref 70–99)

## 2020-01-09 MED ORDER — OZEMPIC (0.25 OR 0.5 MG/DOSE) 2 MG/1.5ML ~~LOC~~ SOPN: PEN_INJECTOR | SUBCUTANEOUS | 0 refills | Status: DC

## 2020-01-09 MED FILL — OZEMPIC 0.25 OR 0.5 MG/DOSE: 2 | 28 days supply | Qty: 2 | Fill #0

## 2020-01-23 MED FILL — !LANTUS SOLOSTAR 100UNITS/M: 100 | 24 days supply | Qty: 6 | Fill #1

## 2020-02-05 NOTE — Progress Notes (Signed)
S:  PCP: None in Epic, per pt new PCP will be Teresa Camacho  No chief complaint on file.  Patient arrives in good spirits. Presents for diabetes evaluation, education, and management. Patient was last seen by Teresa Camacho and referred on 12/26/19. At that visit, metformin and Lantus were increased. Last seen in pharmacy clinic 01/09/20 at which time Ozempic was started.    Today, patient reports she is tolerating Ozempic well with only minor stomach cramping that is not problematic. Endorses making positive lifestyle changes such as eating low carb/sugar and exercising. However, she reports frustration with not seeing her blood sugars decreasing by much. Patient would like to receive her flu and shingles vaccines today.   T2DM diagnosed 11/2019.   Insurance coverage/medication affordability: self-play, GoodRx Gold  Current diabetes medications:  1. Metformin 1000 mg BID 2. Lantus 25 units daily 3. Ozempic 0.25 mg weekly  Adverse effects: denies any side effects Prior diabetes med trials: none  Hypoglycemia: - Denies any BG < 70  Lifestyle: Diet: eating low carb, low sugar, no red meat, vegetables Exercise: 3x/week aerobic, adding in weights  Current hypertension medications include: lisinopril 10 mg daily, metoprolol 25 mg BID Current hyperlipidemia medications include: atorvastatin 80 mg daily  O:  POCT BG today: 285 (has only had almond milk today)  Lab Results  Component Value Date   HGBA1C 11.5 (H) 12/01/2019   There were no vitals filed for this visit.  Home BG Readings FBG: AM: 245, 249, 251, 285, 260; PM: 220, 257, 264; 7 day average 257; 90 day 267  Microalbumin/Creatinine Urine Ratio: 231 (12/26/19)  Lipid Panel     Component Value Date/Time   CHOL 290 (H) 12/02/2019 0648   TRIG 840 (H) 12/02/2019 0648   HDL 36 (L) 12/02/2019 0648   CHOLHDL 8.1 12/02/2019 0648   VLDL UNABLE TO CALCULATE IF TRIGLYCERIDE OVER 400 mg/dL 12/02/2019 0648   LDLCALC UNABLE TO  CALCULATE IF TRIGLYCERIDE OVER 400 mg/dL 12/02/2019 0648   LDLDIRECT 95.5 12/02/2019 0648    Clinical Atherosclerotic Cardiovascular Disease (ASCVD): No  The 10-year ASCVD risk score Mikey Bussing DC Jr., et al., 2013) is: 8.6%   Values used to calculate the score:     Age: 54 years     Sex: Female     Is Non-Hispanic African American: No     Diabetic: Yes     Tobacco smoker: No     Systolic Blood Pressure: 932 mmHg     Is BP treated: Yes     HDL Cholesterol: 36 mg/dL     Total Cholesterol: 290 mg/dL    A: 1. Diabetes - Most recent A1c of 11.5 not at goal < 7%, next due 03/02/2020 - Clinic BG today and home BG readings not at goal - Tolerating current medications, no hypoglycemia - Tolerating recent addition of GLP-1 agonist well. Acceptable to titrate dose.  - Diabetic kidney disease with UACR > 30, already on ACEI - Educated on Rule of 15s - Given patient's elevated triglycerides, need additional lowering therapy. Statin is not likely to reduce triglycerides to goal alone.   P: - Increase Ozempic to 0.5 mg once weekly - Initiate fenofibrate 48 mg daily - Fasting lipid panel at follow up - Continue metformin 1000 mg BID and lantus 25 units daily - Continue checking blood sugars and bring log/glucometer to next visit - Counseled on lifestyle modifications - Call Clinical Pharmacist if any episodes of hypoglycemia or GLP1 intolerance (vomiting, diarrhea, nausea) -Administered influenza  vaccine and Shingrix today  - F/U Pharmacist Clinic Visit in 1 month Total time in face to face counseling 30 minutes    Teresa Camacho, PharmD PGY1 Pharmacy Resident 02/06/2020 11:58 AM

## 2020-02-06 ENCOUNTER — Ambulatory Visit: Payer: Self-pay | Attending: Internal Medicine | Admitting: Pharmacist

## 2020-02-06 ENCOUNTER — Encounter: Payer: Self-pay | Admitting: Pharmacist

## 2020-02-06 ENCOUNTER — Other Ambulatory Visit: Payer: Self-pay | Admitting: Pharmacist

## 2020-02-06 ENCOUNTER — Other Ambulatory Visit: Payer: Self-pay

## 2020-02-06 DIAGNOSIS — E785 Hyperlipidemia, unspecified: Secondary | ICD-10-CM

## 2020-02-06 DIAGNOSIS — E1169 Type 2 diabetes mellitus with other specified complication: Secondary | ICD-10-CM

## 2020-02-06 DIAGNOSIS — Z23 Encounter for immunization: Secondary | ICD-10-CM

## 2020-02-06 DIAGNOSIS — E1165 Type 2 diabetes mellitus with hyperglycemia: Secondary | ICD-10-CM

## 2020-02-06 DIAGNOSIS — Z794 Long term (current) use of insulin: Secondary | ICD-10-CM

## 2020-02-06 LAB — GLUCOSE, POCT (MANUAL RESULT ENTRY): POC Glucose: 285 mg/dl — AB (ref 70–99)

## 2020-02-06 MED ORDER — FENOFIBRATE 48 MG PO TABS: 48.0000 mg | ORAL_TABLET | Freq: Every day | ORAL | 2 refills | Status: DC

## 2020-02-06 MED ORDER — OZEMPIC (0.25 OR 0.5 MG/DOSE) 2 MG/1.5ML ~~LOC~~ SOPN: PEN_INJECTOR | SUBCUTANEOUS | 2 refills | Status: DC

## 2020-02-06 MED FILL — FENOFIBRATE 48 MG TABLET: 48 | 30 days supply | Qty: 30 | Fill #0

## 2020-02-06 MED FILL — OZEMPIC 0.25 OR 0.5 MG/DOSE: 2 | 28 days supply | Qty: 2 | Fill #0

## 2020-02-14 MED FILL — LANTUS SOLOSTAR 100 UNITS/M: 100 | 24 days supply | Qty: 6 | Fill #2

## 2020-02-29 ENCOUNTER — Other Ambulatory Visit: Payer: Self-pay

## 2020-02-29 ENCOUNTER — Encounter: Payer: Self-pay | Admitting: Internal Medicine

## 2020-02-29 ENCOUNTER — Ambulatory Visit: Payer: Medicaid Other | Attending: Internal Medicine | Admitting: Internal Medicine

## 2020-02-29 ENCOUNTER — Ambulatory Visit: Payer: Medicaid Other | Admitting: Pharmacist

## 2020-02-29 DIAGNOSIS — E1165 Type 2 diabetes mellitus with hyperglycemia: Secondary | ICD-10-CM

## 2020-02-29 DIAGNOSIS — Z23 Encounter for immunization: Secondary | ICD-10-CM

## 2020-02-29 DIAGNOSIS — Z1211 Encounter for screening for malignant neoplasm of colon: Secondary | ICD-10-CM

## 2020-02-29 DIAGNOSIS — Z794 Long term (current) use of insulin: Secondary | ICD-10-CM

## 2020-02-29 MED ORDER — LANTUS SOLOSTAR 100 UNIT/ML ~~LOC~~ SOPN: PEN_INJECTOR | SUBCUTANEOUS | 6 refills | Status: DC

## 2020-02-29 MED FILL — $LANTUS SOLOSTAR 100 UNITS/: 100 | 85 days supply | Qty: 30 | Fill #0

## 2020-02-29 NOTE — Patient Instructions (Signed)
Increase insulin to 30 units daily then increase in 2 unit increments every 2-3 days until morning blood sugars less than 140 or you reach 35 units.  I will give your packet for the orange card/cone discount to our financial counselor. Once approved, let me know so that I can refer you for the colonoscopy.

## 2020-02-29 NOTE — Progress Notes (Signed)
Virtual Visit via Telephone Note Due to current restrictions/limitations of in-office visits due to the COVID-19 pandemic, this scheduled clinical appointment was converted to a telehealth visit  I connected with Teresa Camacho on 02/29/20 at 10:53 a.m by telephone and verified that I am speaking with the correct person using two identifiers.   I am in my office.  The patient is in her car.  Only the patient my [place] and myself participated in this encounter.  I discussed the limitations, risks, security and privacy concerns of performing an evaluation and management service by telephone and the availability of in person appointments. I also discussed with the patient that there may be a patient responsible charge related to this service. The patient expressed understanding and agreed to proceed.   History of Present Illness: Patient with history of DM type II with microalbumin, HTN, HL, OSA on CPAP, GERD,PCOS, pattern baldness.  Purpose of today's visit is 2 months follow-up on diabetes.  Last seen 12/2019.  HM:  Referred for MMG on last visit.  She filled out the form for the [facility] program and mailed it in but she has not been called as yet.  Needs c-scope due to age and fhx.  Pt has form for [facility] discount completed and ready to turn in.  Thought she was coming in person today but was stopped at the front desk after reporting exposure to her sister who had tested positive for Covid.  DIABETES TYPE 2 Last A1C:   Results for orders placed or performed in visit on 02/06/20  Glucose (CBG)  Result Value Ref Range   POC Glucose 285 (A) 70 - 99 mg/dl    Med Adherence:  _0  Yes.  She is on Ozempic, Lantus 25 units and Metformin.  We had increase Lantus on last visit. Medication side effects:  _1  Yes    _2  No Home Monitoring?  _3  Yes    _4  No Home glucose results range: mid 200s despite dietary changes and exercise.  Blood sugars before breakfast 250-280; 2 hours after dinner 205-300 Diet  Adherence: _5  Yes -watching portion sizes, eating less Exercise: _6  Yes -walks 5 x a wk and does aerobic video 3 x a wk   Hypoglycemic episodes?: _7  Yes    _8  No Numbness of the feet? _9  Yes    _10  No Retinopathy hx? _11  Yes    _12  No Last eye exam: over due.  Not able to afford Comments:     Outpatient Encounter Medications as of 02/29/2020  Medication Sig Note  . acetaminophen (TYLENOL) 500 MG tablet Take 1,000 mg by mouth every 6 (six) hours as needed for mild pain.   Marland Kitchen aspirin EC 81 MG tablet Take 1 tablet (81 mg total) by mouth daily. Swallow whole.   Marland Kitchen atorvastatin (LIPITOR) 80 MG tablet Take 1 tablet (80 mg total) by mouth daily.   . blood glucose meter kit and supplies KIT Dispense based on patient and insurance preference. Use up to four times daily as directed. (FOR ICD-9 250.00, 250.01).   . Blood Glucose Monitoring Suppl (TRUE METRIX METER) w/Device KIT Use as directed   . Coenzyme Q10 (COQ10 PO) Take 1 capsule by mouth every evening.   . fenofibrate (TRICOR) 48 MG tablet Take 1 tablet (48 mg total) by mouth daily.   Marland Kitchen glucose blood (TRUE METRIX BLOOD GLUCOSE TEST) test strip Use as instructed   . insulin glargine (LANTUS SOLOSTAR) 100 UNIT/ML Solostar Pen Inject 25 Units into the  skin daily.   . Insulin Pen Needle 32G X 4 MM MISC 1 each by Does not apply route in the morning and at bedtime.   . lidocaine (LIDODERM) 5 % Place 1 patch onto the skin daily. Remove & Discard patch within 12 hours or as directed by MD   . lisinopril (ZESTRIL) 10 MG tablet Take 1 tablet (10 mg total) by mouth daily.   Marland Kitchen loratadine (CLARITIN) 10 MG tablet Take 1 tablet (10 mg total) by mouth daily.   Marland Kitchen MELATONIN PO Take 5 tablets by mouth daily as needed (sleep).  06/08/2013: Unsure of dose  . metFORMIN (GLUCOPHAGE) 1000 MG tablet Take 1 tablet (1,000 mg total) by mouth 2 (two) times daily with a meal.   . metoprolol tartrate (LOPRESSOR) 25 MG tablet Take 1 tablet (25 mg total) by mouth 2 (two) times  daily.   . Multiple Vitamins-Minerals (MULTIVITAMIN WITH MINERALS) tablet Take 1 tablet by mouth daily.   . pantoprazole (PROTONIX) 40 MG tablet Take 1 tablet (40 mg total) by mouth daily.   . Semaglutide,0.25 or 0.5MG/DOS, (OZEMPIC, 0.25 OR 0.5 MG/DOSE,) 2 MG/1.5ML SOPN Inject 0.5 mg subq once weekly.   . TRUEplus Lancets 28G MISC Use as directed   . VITAMIN D PO Take 1 tablet by mouth daily.    No facility-administered encounter medications on file as of 02/29/2020.      Observations/Objective: Results for orders placed or performed in visit on 02/06/20  Glucose (CBG)  Result Value Ref Range   POC Glucose 285 (A) 70 - 99 mg/dl     Chemistry      Component Value Date/Time   NA 137 12/02/2019 0648   K 3.8 12/02/2019 0648   CL 103 12/02/2019 0648   CO2 21 (L) 12/02/2019 0648   BUN 10 12/02/2019 0648   CREATININE 0.49 12/02/2019 0648      Component Value Date/Time   CALCIUM 8.9 12/02/2019 0648   ALKPHOS 47 06/14/2013 2217   AST 11 06/14/2013 2217   ALT 11 06/14/2013 2217   BILITOT 0.4 06/14/2013 2217       Assessment and Plan: 1. Type 2 diabetes mellitus with hyperglycemia, with long-term current use of insulin (HCC) Not at goal. I recommend increasing Lantus to 30 units daily and increase by 2 units every 2 to 3 days until the morning blood sugars are less than 140 or she reaches 35 units daily. -Follow-up with clinical pharmacist in 2 weeks.  Bring blood sugar readings with you. - insulin glargine (LANTUS SOLOSTAR) 100 UNIT/ML Solostar Pen; 30 units subcut daily at bedtime.  Increase by 2 units Q 2-3 days until a.m blood sugars less than 140 or you reach 35 units.  Dispense: 15 mL; Refill: 6  2. Need for Tdap vaccination She will need to get the Tdap vaccine when she sees the clinical pharmacist in 2 weeks  3. Screening for colon cancer Appointment given with a financial counselor for later this month.  She will bring her completed forms with her for the orange  card/cone discount.  Once approved she will let me know so that I can submit a referral to the gastroenterologist   Follow Up Instructions: 3 mths   I discussed the assessment and treatment plan with the patient. The patient was provided an opportunity to ask questions and all were answered. The patient agreed with the plan and demonstrated an understanding of the instructions.   The patient was advised to call back or seek an  in-person evaluation if the symptoms worsen or if the condition fails to improve as anticipated.  I provided 14 minutes of non-face-to-face time during this encounter.   Karle Plumber, MD

## 2020-02-29 NOTE — Progress Notes (Signed)
Pt states her blood sugar was 252 before breakfast

## 2020-03-06 MED FILL — TRUEplus LANCETS 28G MISC: 30 days supply | Qty: 100 | Fill #1

## 2020-03-06 MED FILL — TRUE METRIX TEST STRIP: 30 days supply | Qty: 100 | Fill #1

## 2020-03-06 MED FILL — OZEMPIC 0.25 OR 0.5 MG/DOSE: 2 | 28 days supply | Qty: 2 | Fill #1

## 2020-03-08 MED FILL — $LANTUS SOLOSTAR 100 UNITS/: 100 | 85 days supply | Qty: 30 | Fill #0

## 2020-03-12 ENCOUNTER — Ambulatory Visit: Payer: Self-pay | Attending: Internal Medicine

## 2020-03-12 ENCOUNTER — Other Ambulatory Visit: Payer: Self-pay

## 2020-03-14 ENCOUNTER — Ambulatory Visit: Payer: Self-pay | Attending: Internal Medicine | Admitting: Pharmacist

## 2020-03-14 ENCOUNTER — Other Ambulatory Visit: Payer: Self-pay | Admitting: Internal Medicine

## 2020-03-14 ENCOUNTER — Encounter: Payer: Self-pay | Admitting: Pharmacist

## 2020-03-14 ENCOUNTER — Other Ambulatory Visit: Payer: Self-pay

## 2020-03-14 DIAGNOSIS — E1165 Type 2 diabetes mellitus with hyperglycemia: Secondary | ICD-10-CM

## 2020-03-14 DIAGNOSIS — Z794 Long term (current) use of insulin: Secondary | ICD-10-CM

## 2020-03-14 LAB — POCT GLYCOSYLATED HEMOGLOBIN (HGB A1C): Hemoglobin A1C: 9.5 % — AB (ref 4.0–5.6)

## 2020-03-14 MED ORDER — OZEMPIC (1 MG/DOSE) 2 MG/1.5ML ~~LOC~~ SOPN: 1.0000 mg | PEN_INJECTOR | SUBCUTANEOUS | 2 refills | Status: DC

## 2020-03-14 NOTE — Progress Notes (Signed)
PCP: Dr. Wynetta Emery S:     Patient arrives in good spirits. Presents for diabetes evaluation, education, and management Patient was referred and last seen by Primary Care Provider on 02/29/20 for a telemed visit. At that time, lantus was increased to 30 units and patient was instructed to titrate up to 35 units depending on FBGs. Patient has been seen by the PharmD in the past and is in the process of titrating Ozempic.   Today, patient reports she is tolerating Ozempic well and has titrated lantus up to 35 units. She expresses discouragement as her glucose values aren't coming down as much as she would like.   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   Current diabetes medications include: metformin 1000 mg BID, Lantus 35 units daily, Ozempic 0.5 mg on Wednesdays,  Current hypertension medications include: Lisinopril 10 mg daily, metoprolol tartrate 25 mg BID Current hyperlipidemia medications include: Atorvastatin 80 mg daily, fenofibrate 48 mg daily  Patient denies hypoglycemic events.  Patient reported dietary habits: Endorses low carb diet and soup Drinks: water, occasional diet soda  Patient-reported exercise habits: walks 5 times a week for 30-45 minutes   Patient denies nocturia (nighttime urination).  Patient reports neuropathy (nerve pain). Patient denies visual changes. Patient reports self foot exams.  Denies polyuria/dypsia  O:   Lab Results  Component Value Date   HGBA1C 9.5 (A) 03/14/2020   Lipid Panel     Component Value Date/Time   CHOL 290 (H) 12/02/2019 0648   TRIG 840 (H) 12/02/2019 0648   HDL 36 (L) 12/02/2019 0648   CHOLHDL 8.1 12/02/2019 0648   VLDL UNABLE TO CALCULATE IF TRIGLYCERIDE OVER 400 mg/dL 12/02/2019 0648   LDLCALC UNABLE TO CALCULATE IF TRIGLYCERIDE OVER 400 mg/dL  12/02/2019 0648   LDLDIRECT 95.5 12/02/2019 0648    Home fasting blood sugars: 211-250, outliers of 261, 281 2 hour post-meal/random blood sugars: 176-260, outliers 302, 316 14D avg 240 30D avg 256  Clinical Atherosclerotic Cardiovascular Disease (ASCVD): No  The 10-year ASCVD risk score Mikey Bussing DC Jr., et al., 2013) is: 8.6%   Values used to calculate the score:     Age: 54 years     Sex: Female     Is Non-Hispanic African American: No     Diabetic: Yes     Tobacco smoker: No     Systolic Blood Pressure: 614 mmHg     Is BP treated: Yes     HDL Cholesterol: 36 mg/dL     Total Cholesterol: 290 mg/dL    A/P: Diabetes longstanding currently uncontrolled, but improving. A1c today is 9.5, improved from previous 11.5 in July. Encouraged patient to continue lifestyle modifications and medication adherence even though her numbers aren't ideal. As patient is tolerating current Ozempic dose, will increase dose today. Patient is to return in November for her second shingles shot so will revisit the need for further lantus increase at that visit. Patient is able to verbalize appropriate hypoglycemia management plan. Medication adherence appears appropriate. Control is suboptimal due to need for medication titration.  -Continued basal insulin Lantus (insulin glargine) 35 units daily..  -Increased dose of GLP-1 Ozempic (generic name semaglutide) to 1 mg weekly.  -Extensively discussed pathophysiology of diabetes, recommended lifestyle interventions, dietary effects on blood sugar control -Counseled on s/sx of and management of hypoglycemia -Next A1C anticipated 05/2020.   HLD/ASCVD risk - primary prevention  in patient with diabetes. Last LDL is not controlled. ASCVD risk score is not >20%  - high intensity statin indicated. Aspirin is indicated.  -Continued aspirin 81 mg  -Continued Atorvastatin 80 mg.  -Continued fenofibrate 48 mg. -Repeat lipid panel should be collected at next visit (6-8 weeks  after fenofibrate initiation)  Health maintenance: -Patient given her TDAP vaccine today; will coordinate with pharmacy for patient assistance with Adacel  -Patient will receive her second shingles shot at next visit  Written patient instructions provided.  Total time in face to face counseling 30 minutes.   Follow up Pharmacist Clinic Visit in 3 weeks (04/05/20).  Patient seen with  Jacobo Forest PharmD Candidate [facility] [place], PharmD, [place] [phone]

## 2020-04-05 ENCOUNTER — Other Ambulatory Visit: Payer: Self-pay

## 2020-04-05 ENCOUNTER — Ambulatory Visit: Payer: Self-pay | Admitting: Pharmacist

## 2020-04-05 ENCOUNTER — Other Ambulatory Visit: Payer: Medicaid Other | Admitting: Pharmacist

## 2020-04-05 ENCOUNTER — Encounter: Payer: Self-pay | Admitting: Pharmacist

## 2020-04-05 DIAGNOSIS — E1165 Type 2 diabetes mellitus with hyperglycemia: Secondary | ICD-10-CM

## 2020-04-05 DIAGNOSIS — Z794 Long term (current) use of insulin: Secondary | ICD-10-CM

## 2020-04-05 MED ORDER — LANTUS SOLOSTAR 100 UNIT/ML ~~LOC~~ SOPN: 45.0000 [IU] | PEN_INJECTOR | Freq: Every day | SUBCUTANEOUS | 2 refills | Status: DC

## 2020-04-05 MED FILL — OZEMPIC 1 MG/DOSE SOPN: 2 | 28 days supply | Qty: 5 | Fill #0

## 2020-04-05 NOTE — Patient Instructions (Signed)
Thank you for coming to see me today. Please do the following:  1. Increase Lantus to 45 units daily.  2. Continue metformin and Ozempic.  3. Continue checking blood sugars at home. It's really important that you record these and bring these in to your next doctor's appointment. If you get in readings above 500 or lower than 70, call me or the clinic to let your doctor know. See below on how to treat low blood sugar.  4. Continue making the lifestyle changes we've discussed together during our visit. Diet and exercise play a significant role in improving your blood sugars.  Follow-up with me in 1 month.   Hypoglycemia or low blood sugar:   Low blood sugar can happen quickly and may become an emergency if not treated right away.   While this shouldn't happen often, it can be brought upon if you skip a meal or do not eat enough. Also, if your insulin or other diabetes medications are dosed too high, this can cause your blood sugar to go to low.   Warning signs of low blood sugar include: 1. Feeling shaky or dizzy 2. Feeling weak or tired  3. Excessive hunger 4. Feeling anxious or upset  5. Sweating even when you aren't exercising  What to do if I experience low blood sugar? 1. Check your blood sugar with your meter. If lower than 70, proceed to step 2.  2. Treat with 3-4 glucose tablets or 3 packets of regular sugar. If these aren't around, you can try hard candy. Yet another option would be to drink 4 ounces of fruit juice or 6 ounces of REGULAR soda.  3. Re-check your sugar in 15 minutes. If it is still below 70, do what you did in step 2 again. If has come back up, go ahead and eat a snack or small meal at this time.

## 2020-04-05 NOTE — Progress Notes (Signed)
  PCP: Dr. Wynetta Emery S:    Patient arrives in good spirits. Presents for diabetes evaluation, education, and management Patient was referred and last seen by Primary Care Provider on 02/29/20. We last saw her 03/14/2020 and increased Ozempic to 1 mg weekly. Today, patient reports she is tolerating Ozempic well.  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   Current diabetes medications include: metformin 1000 mg BID, Lantus 35 units daily, Ozempic 1.0 mg on Wednesdays Current hypertension medications include: Lisinopril 10 mg daily, metoprolol tartrate 25 mg BID Current hyperlipidemia medications include: Atorvastatin 80 mg daily, fenofibrate 48 mg daily  Patient denies hypoglycemic events.  Patient reported dietary habits: Endorses low carb diet and soup Drinks: water, occasional diet soda  Patient-reported exercise habits: walks 5 times a week for 30-45 minutes   Patient denies nocturia (nighttime urination).  Patient reports neuropathy (nerve pain). Patient denies visual changes. Patient reports self foot exams.  Denies polyuria/dypsia  O:   Lab Results  Component Value Date   HGBA1C 9.5 (A) 03/14/2020   Lipid Panel     Component Value Date/Time   CHOL 290 (H) 12/02/2019 0648   TRIG 840 (H) 12/02/2019 0648   HDL 36 (L) 12/02/2019 0648   CHOLHDL 8.1 12/02/2019 0648   VLDL UNABLE TO CALCULATE IF TRIGLYCERIDE OVER 400 mg/dL 12/02/2019 0648   LDLCALC UNABLE TO CALCULATE IF TRIGLYCERIDE OVER 400 mg/dL 12/02/2019 0648   LDLDIRECT 95.5 12/02/2019 0648    Home fasting blood sugars: 197 - 274  2 hour post-meal/random blood sugars: 176 - 309 14D avg 229 30D avg 223  Clinical Atherosclerotic Cardiovascular Disease (ASCVD): No  The 10-year ASCVD risk score Mikey Bussing DC Jr., et al., 2013) is: 8.6%   Values  used to calculate the score:     Age: 43 years     Sex: Female     Is Non-Hispanic African American: No     Diabetic: Yes     Tobacco smoker: No     Systolic Blood Pressure: 630 mmHg     Is BP treated: Yes     HDL Cholesterol: 36 mg/dL     Total Cholesterol: 290 mg/dL    A/P: Diabetes longstanding currently uncontrolled. Patient is able to verbalize appropriate hypoglycemia management plan. Medication adherence appears appropriate. Control is suboptimal due to need for medication titration. -Increased basal insulin Lantus (insulin glargine) to 45 units daily.  -Continue other medications.  -Extensively discussed pathophysiology of diabetes, recommended lifestyle interventions, dietary effects on blood sugar control -Counseled on s/sx of and management of hypoglycemia -Next A1C anticipated 05/2020.   HLD/ASCVD risk - primary prevention in patient with diabetes. Last LDL is not controlled. ASCVD risk score is not >20%  - high intensity statin indicated. Aspirin is indicated. Will get new lipid panel to reassess.  -Continued aspirin 81 mg  -Continued Atorvastatin 80 mg.  -Continued fenofibrate 48 mg. -Lipid  Health maintenance: -Shingrix given; patient assistance already coordinated   Written patient instructions provided.  Total time in face to face counseling 30 minutes.   Follow up Pharmacist Clinic Visit in 1 month.   Benard Halsted, PharmD, [place] [phone]

## 2020-04-06 LAB — LIPID PANEL
Chol/HDL Ratio: 2.5 ratio (ref 0.0–4.4)
Cholesterol, Total: 82 mg/dL — ABNORMAL LOW (ref 100–199)
HDL: 33 mg/dL — ABNORMAL LOW (ref >39)
LDL Chol Calc (NIH): 22 mg/dL (ref 0–99)
Triglycerides: 163 mg/dL — ABNORMAL HIGH (ref 0–149)
VLDL Cholesterol Cal: 27 mg/dL (ref 5–40)

## 2020-04-07 NOTE — Progress Notes (Signed)
Cholesterol and triglyceride levels have improved.  Triglyceride level in July was 840 with normal being less than 150.  Triglyceride level this time was 163.  LDL cholesterol was 22 with goal being less than 70.  Continue atorvastatin and fenofibrate.

## 2020-04-08 ENCOUNTER — Telehealth: Payer: Self-pay

## 2020-04-08 NOTE — Telephone Encounter (Signed)
Contacted pt to go over lab results pt didn't answer lvm  

## 2020-05-06 ENCOUNTER — Ambulatory Visit: Payer: Self-pay | Attending: Internal Medicine | Admitting: Pharmacist

## 2020-05-06 ENCOUNTER — Other Ambulatory Visit: Payer: Self-pay

## 2020-05-06 ENCOUNTER — Telehealth: Payer: Self-pay | Admitting: Pharmacist

## 2020-05-06 ENCOUNTER — Encounter: Payer: Self-pay | Admitting: Pharmacist

## 2020-05-06 DIAGNOSIS — Z794 Long term (current) use of insulin: Secondary | ICD-10-CM

## 2020-05-06 DIAGNOSIS — E1165 Type 2 diabetes mellitus with hyperglycemia: Secondary | ICD-10-CM

## 2020-05-06 MED ORDER — LANTUS SOLOSTAR 100 UNIT/ML ~~LOC~~ SOPN: 55.0000 [IU] | PEN_INJECTOR | Freq: Every day | SUBCUTANEOUS | 2 refills | Status: DC

## 2020-05-06 MED FILL — $LANTUS SOLOSTAR 100 UNITS/: 100 | 33 days supply | Qty: 15 | Fill #0

## 2020-05-06 MED FILL — OZEMPIC (1 MG/DOSE) 4 MG/3M: 4 | 28 days supply | Qty: 3 | Fill #0

## 2020-05-06 NOTE — Telephone Encounter (Signed)
Dr. Wynetta Emery -   I've seen this patient over the past several months for DM management. A1c and cholesterol parameters have improved, but her sugars have been holding steady at the low 200s despite continued titration of her insulin. We had a lengthy discussion and she would like an Endo referral. Is it possible to submit this? Thank you for all you do!

## 2020-05-06 NOTE — Progress Notes (Signed)
  PCP: Dr. Wynetta Emery  S:    Patient arrives in good spirits. Presents for diabetes evaluation, education, and management. Patient was referred and last seen by Primary Care Provider on 02/29/20. We last saw her 04/05/2020 and increased her Lantus rx.  Today, she reports doing well overall.   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   Current diabetes medications include: metformin 1000 mg BID, Lantus 45 units daily, Ozempic 1.0 mg on Wednesdays Current hypertension medications include: Lisinopril 10 mg daily, metoprolol tartrate 25 mg BID Current hyperlipidemia medications include: Atorvastatin 80 mg daily, fenofibrate 48 mg daily  Patient denies hypoglycemic events.  Patient reported dietary habits: Endorses low carb diet and soup. Mostly eating 1 meal/day in an attempt to better control home CBGs.  Drinks: water, occasional diet soda  Patient-reported exercise habits: walks 5 times a week for 30-45 minutes   Patient denies nocturia (nighttime urination).  Patient reports neuropathy (nerve pain). Patient denies visual changes. Patient reports self foot exams.  Denies polyuria/dypsia  O:   Lab Results  Component Value Date   HGBA1C 9.5 (A) 03/14/2020   Lipid Panel     Component Value Date/Time   CHOL 82 (L) 04/05/2020 0923   TRIG 163 (H) 04/05/2020 0923   HDL 33 (L) 04/05/2020 0923   CHOLHDL 2.5 04/05/2020 0923   CHOLHDL 8.1 12/02/2019 0648   VLDL UNABLE TO CALCULATE IF TRIGLYCERIDE OVER 400 mg/dL 12/02/2019 0648   LDLCALC 22 04/05/2020 0923   LDLDIRECT 95.5 12/02/2019 0648   Home CBG averages from home glucometer:  7-day: 209 14-day: 214 30-day: 220  Clinical Atherosclerotic Cardiovascular Disease (ASCVD): No  The ASCVD Risk score Mikey Bussing DC Jr., et al., 2013) failed to calculate for the  following reasons:   The valid total cholesterol range is 130 to 320 mg/dL    A/P: Diabetes longstanding currently uncontrolled. Patient is able to verbalize appropriate hypoglycemia management plan. Medication adherence appears appropriate.  Will increase Lantus rx. Insulin resistance, hx of PCOS contributing to suboptimal control. Pt may require an insulin sensitizer, such as pioglitazone in the future. Other options to improve glycemic control include an oral SGLT-2 inh. We also spoke about the possible need for bolus insulin.  After discussion with the patient, I believe an Endo referral would be most appropriate at this time.   -Increased basal insulin Lantus (insulin glargine) to 55 units daily.  -Continue other medications.  -Extensively discussed pathophysiology of diabetes, recommended lifestyle interventions, dietary effects on blood sugar control -Counseled on s/sx of and management of hypoglycemia -Next A1C anticipated 05/2020.   HLD/ASCVD risk - primary prevention in patient with diabetes. Last LDL is controlled. ASCVD risk score is not >20%  - high intensity statin indicated.  -Continued Atorvastatin 80 mg.  -Continued fenofibrate 48 mg.  Written patient instructions provided.  Total time in face to face counseling 30 minutes.   Follow up PCP Clinic Visit next month.   Benard Halsted, PharmD, [place], [place] [phone]

## 2020-05-06 NOTE — Addendum Note (Signed)
Addended by: Karle Plumber B on: 05/06/2020 09:21 PM   Modules accepted: Orders

## 2020-05-26 ENCOUNTER — Other Ambulatory Visit: Payer: Self-pay | Admitting: Internal Medicine

## 2020-05-26 DIAGNOSIS — E1169 Type 2 diabetes mellitus with other specified complication: Secondary | ICD-10-CM

## 2020-05-26 DIAGNOSIS — E785 Hyperlipidemia, unspecified: Secondary | ICD-10-CM

## 2020-05-26 NOTE — Telephone Encounter (Signed)
Requested medication (s) are due for refill today: yes  Requested medication (s) are on the active medication list: yes  Last refill:  02/06/20  Future visit scheduled: yes  Notes to clinic:  overdue lab work   Requested Prescriptions  Pending Prescriptions Disp Refills   fenofibrate (TRICOR) 48 MG tablet [Pharmacy Med Name: FENOFIBRATE 48 MG TABLET] 30 tablet 2    Sig: TAKE ONE TABLET BY MOUTH DAILY      Cardiovascular:  Antilipid - Fibric Acid Derivatives Failed - 05/26/2020  6:51 AM      Failed - Total Cholesterol in normal range and within 360 days    Cholesterol, Total  Date Value Ref Range Status  04/05/2020 82 (L) 100 - 199 mg/dL Final          Failed - HDL in normal range and within 360 days    HDL  Date Value Ref Range Status  04/05/2020 33 (L) >39 mg/dL Final          Failed - Triglycerides in normal range and within 360 days    Triglycerides  Date Value Ref Range Status  04/05/2020 163 (H) 0 - 149 mg/dL Final          Failed - ALT in normal range and within 180 days    ALT  Date Value Ref Range Status  06/14/2013 11 0 - 35 U/L Final          Failed - AST in normal range and within 180 days    AST  Date Value Ref Range Status  06/14/2013 11 0 - 37 U/L Final          Passed - LDL in normal range and within 360 days    LDL Chol Calc (NIH)  Date Value Ref Range Status  04/05/2020 22 0 - 99 mg/dL Final   Direct LDL  Date Value Ref Range Status  12/02/2019 95.5 0 - 99 mg/dL Final    Comment:    Performed at [facility], [address], [place], [place] [address]          Passed - Cr in normal range and within 180 days    Creatinine, Ser  Date Value Ref Range Status  12/02/2019 0.49 0.44 - 1.00 mg/dL Final          Passed - eGFR in normal range and within 180 days    GFR calc Af Amer  Date Value Ref Range Status  12/02/2019 >60 >60 mL/min Final   GFR calc non Af Amer  Date Value Ref Range Status  12/02/2019 >60 >60 mL/min Final           Passed - Valid encounter within last 12 months    Recent Outpatient Visits           2 weeks ago Type 2 diabetes mellitus with hyperglycemia, with long-term current use of insulin [facility])   [place], Annie Main L, RPH-CPP   1 month ago Type 2 diabetes mellitus with hyperglycemia, with long-term current use of insulin [facility])   [place], Annie Main L, RPH-CPP   2 months ago Type 2 diabetes mellitus with hyperglycemia, with long-term current use of insulin [facility])   [place], Annie Main L, RPH-CPP   2 months ago Type 2 diabetes mellitus with hyperglycemia, with long-term current use of insulin ([place])   [place], MD   3 months ago Type 2 diabetes mellitus with hyperglycemia, with long-term current use of insulin [facility])   [place], RPH-CPP       Future Appointments             In 5 days Wynetta Emery, Dalbert Batman, MD [place]

## 2020-05-31 ENCOUNTER — Ambulatory Visit: Payer: Self-pay | Attending: Internal Medicine | Admitting: Internal Medicine

## 2020-05-31 ENCOUNTER — Other Ambulatory Visit: Payer: Self-pay

## 2020-05-31 ENCOUNTER — Other Ambulatory Visit: Payer: Self-pay | Admitting: Internal Medicine

## 2020-05-31 ENCOUNTER — Encounter: Payer: Self-pay | Admitting: Internal Medicine

## 2020-05-31 VITALS — BP 111/71 | HR 91 | Temp 98.5°F | Ht 62.0 in | Wt 225.0 lb

## 2020-05-31 DIAGNOSIS — E1142 Type 2 diabetes mellitus with diabetic polyneuropathy: Secondary | ICD-10-CM

## 2020-05-31 DIAGNOSIS — Z800 Family history of malignant neoplasm of digestive organs: Secondary | ICD-10-CM

## 2020-05-31 DIAGNOSIS — Z6841 Body Mass Index (BMI) 40.0 and over, adult: Secondary | ICD-10-CM

## 2020-05-31 DIAGNOSIS — Z23 Encounter for immunization: Secondary | ICD-10-CM

## 2020-05-31 DIAGNOSIS — Z1211 Encounter for screening for malignant neoplasm of colon: Secondary | ICD-10-CM

## 2020-05-31 DIAGNOSIS — E1165 Type 2 diabetes mellitus with hyperglycemia: Secondary | ICD-10-CM

## 2020-05-31 DIAGNOSIS — I10 Essential (primary) hypertension: Secondary | ICD-10-CM

## 2020-05-31 DIAGNOSIS — Z794 Long term (current) use of insulin: Secondary | ICD-10-CM

## 2020-05-31 LAB — GLUCOSE, POCT (MANUAL RESULT ENTRY): POC Glucose: 212 mg/dl — AB (ref 70–99)

## 2020-05-31 MED ORDER — LANTUS SOLOSTAR 100 UNIT/ML ~~LOC~~ SOPN: PEN_INJECTOR | SUBCUTANEOUS | 6 refills | Status: DC

## 2020-05-31 MED ORDER — GABAPENTIN 300 MG PO CAPS: 300.0000 mg | ORAL_CAPSULE | Freq: Every day | ORAL | 3 refills | Status: DC

## 2020-05-31 MED FILL — TRUE METRIX TEST STRIP: 30 days supply | Qty: 100 | Fill #2

## 2020-05-31 MED FILL — $LANTUS SOLOSTAR 100 UNITS/: 100 | 27 days supply | Qty: 15 | Fill #0

## 2020-05-31 MED FILL — GABAPENTIN 300 MG CAPSULE: 300 | 30 days supply | Qty: 30 | Fill #0

## 2020-05-31 MED FILL — OZEMPIC (1 MG/DOSE) 4 MG/3M: 4 | 28 days supply | Qty: 3 | Fill #1

## 2020-05-31 NOTE — Progress Notes (Signed)
Patient ID: Teresa Camacho, female    DOB: 05/30/1965  MRN: [id number]  CC: Follow-up (Patient is here for 3 months follow up. )   Subjective: Teresa Camacho is a 55 y.o. female who presents for chronic ds management Her concerns today include:  Patient with history of DM type II with microalbumin, HTN, HL, OSA on CPAP, GERD,PCOS, pattern baldness.   DIABETES TYPE 2 Last A1C:   Results for orders placed or performed in visit on 05/31/20  POCT glucose (manual entry)  Result Value Ref Range   POC Glucose 212 (A) 70 - 99 mg/dl    Med Adherence:  [x]  Yes  - lantus 55 units in a.m, Ozempic and Metformin Medication side effects:  []  Yes    [x]  No Home Monitoring?  [x]  Yes  BID - before BF and at bdtime Home glucose results range: numbers good up until 1 wk ago. She has log with her.  Before BF lowest was 137 but range 170-low 200s; bedtime 155-255 Diet Adherence: [x]  Yes -I have changed my diet completely Exercise: [x]  Yes walking 4 x a wk for 45 mins.  Loss 6 lbs since 12/2019 Hypoglycemic episodes?: []  Yes    [x]  No Numbness of the feet? []  Yes    [x]  No but some pain in feet at nights for over 1 yr. "Feel like I need to rub my feet or stretch my feet."  Burning in character.  Keeps her up at nights Retinopathy hx? []  Yes    []  No Last eye exam: over due.   Comments:   HTN:  Compliant with meds and salt restriction.  Medications include metoprolol, lisinopril  HL:  Tolerating Lipitor  HM:  Due for colonoscopy.  Fhx of colon CA in mom and sister with polyps.  Had COVID booster in 03/2020.    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   History of allergy 12/26/2019   OSA on CPAP 12/26/2019   Class 3 severe obesity due to excess calories with serious comorbidity and body mass index (BMI) of 40.0 to 44.9 in adult [facility]) 12/26/2019   Hyperlipidemia associated with type 2 diabetes mellitus ([place]) 12/26/2019   Essential hypertension  12/26/2019   Family history of colonic polyps 12/26/2019   Gastroesophageal reflux disease without esophagitis 12/26/2019   Type 2 diabetes mellitus with hyperglycemia, with long-term current use of insulin ([place]) 11/30/2019   Iron deficiency anemia due to chronic blood loss 06/28/2013   S/P hysterectomy 06/13/2013     Current Outpatient Medications on File Prior to Visit  Medication Sig Dispense Refill   acetaminophen (TYLENOL) 500 MG tablet Take 1,000 mg by mouth every 6 (six) hours as needed for mild pain.     aspirin EC 81 MG tablet Take 1 tablet (81 mg total) by mouth daily. Swallow whole. 100 tablet 2   atorvastatin (LIPITOR) 80 MG tablet Take 1 tablet (80 mg total) by mouth daily. 30 tablet 6   blood glucose meter kit and supplies KIT Dispense based on patient and insurance preference. Use up to four times daily as directed. (FOR ICD-9 250.00, 250.01). 1 each 0   Blood Glucose Monitoring Suppl (TRUE METRIX METER) w/Device KIT Use as directed 1 kit 0   Coenzyme Q10 (COQ10 PO) Take 1 capsule by mouth every evening.     fenofibrate (TRICOR) 48 MG tablet TAKE ONE TABLET BY MOUTH DAILY 30 tablet 2   glucose blood (TRUE  METRIX BLOOD GLUCOSE TEST) test strip Use as instructed 100 each 12   Insulin Pen Needle 32G X 4 MM MISC 1 each by Does not apply route in the morning and at bedtime. 100 each 1   lidocaine (LIDODERM) 5 % Place 1 patch onto the skin daily. Remove & Discard patch within 12 hours or as directed by MD 30 patch 0   lisinopril (ZESTRIL) 10 MG tablet Take 1 tablet (10 mg total) by mouth daily. 30 tablet 6   loratadine (CLARITIN) 10 MG tablet Take 1 tablet (10 mg total) by mouth daily. 30 tablet 6   MELATONIN PO Take 5 tablets by mouth daily as needed (sleep).      metFORMIN (GLUCOPHAGE) 1000 MG tablet Take 1 tablet (1,000 mg total) by mouth 2 (two) times daily with a meal. 60 tablet 6   metoprolol tartrate (LOPRESSOR) 25 MG tablet Take 1 tablet (25 mg total) by  mouth 2 (two) times daily. 60 tablet 6   Multiple Vitamins-Minerals (MULTIVITAMIN WITH MINERALS) tablet Take 1 tablet by mouth daily.     pantoprazole (PROTONIX) 40 MG tablet Take 1 tablet (40 mg total) by mouth daily. 30 tablet 6   Semaglutide, 1 MG/DOSE, (OZEMPIC, 1 MG/DOSE,) 2 MG/1.5ML SOPN Inject 1 mg into the skin once a week. 3 mL 2   TRUEplus Lancets 28G MISC Use as directed 100 each 4   VITAMIN D PO Take 1 tablet by mouth daily.     No current facility-administered medications on file prior to visit.    Allergies  Allergen Reactions   Codeine Nausea And Vomiting    Pt is able to take percocet & vicodin    Social History   Socioeconomic History   Marital status: Divorced    Spouse name: Not on file   Number of children: 5   Years of education: Not on file   Highest education level: Not on file  Occupational History   Not on file  Tobacco Use   Smoking status: Never Smoker   Smokeless tobacco: Never Used  Vaping Use   Vaping Use: Never used  Substance and Sexual Activity   Alcohol use: No   Drug use: No   Sexual activity: Never  Other Topics Concern   Not on file  Social History Narrative   Not on file   Social Determinants of Health   Financial Resource Strain: Not on file  Food Insecurity: Not on file  Transportation Needs: Not on file  Physical Activity: Not on file  Stress: Not on file  Social Connections: Not on file  Intimate Partner Violence: Not on file    Family History  Problem Relation Age of Onset   Cancer Mother    Depression Mother    Diabetes Mother    Arthritis Mother    Heart disease Mother    Learning disabilities Mother    Mental illness Mother    Cancer Father    Arthritis Father    Cancer Maternal Aunt        x 2 Aunts    Past Surgical History:  Procedure Laterality Date   ABDOMINAL HYSTERECTOMY N/A 06/13/2013   Procedure: HYSTERECTOMY ABDOMINAL and removal of vaginal skin tag;  Surgeon: Luz Lex, MD;  Location: [facility];  Service: Gynecology;  Laterality: N/A;   APPENDECTOMY     CESAREAN SECTION     3x   CHOLECYSTECTOMY     CHOLECYSTECTOMY  1991    ROS: Review of Systems  Negative except as stated above  PHYSICAL EXAM: BP 111/71 (BP Location: Left Arm, Patient Position: Sitting, Cuff Size: Large)    Pulse 91    Temp 98.5 F (36.9 C) (Oral)    Ht 5' 2"  (1.575 m)    Wt 225 lb (102.1 kg)    LMP 04/17/2013    SpO2 97%    BMI 41.15 kg/m   Wt Readings from Last 3 Encounters:  05/31/20 225 lb (102.1 kg)  12/26/19 231 lb 6.4 oz (105 kg)  12/02/19 227 lb 3.2 oz (103.1 kg)    Physical Exam  General appearance - alert, well appearing, and in no distress Mental status - normal mood, behavior, speech, dress, motor activity, and thought processes Chest - clear to auscultation, no wheezes, rales or rhonchi, symmetric air entry Heart - normal rate, regular rhythm, normal S1, S2, no murmurs, rubs, clicks or gallops Extremities - peripheral pulses normal, no pedal edema, no clubbing or cyanosis Diabetic Foot Exam - Simple   Simple Foot Form Visual Inspection No deformities, no ulcerations, no other skin breakdown bilaterally: Yes Sensation Testing Intact to touch and monofilament testing bilaterally: Yes Pulse Check Posterior Tibialis and Dorsalis pulse intact bilaterally: Yes Comments      CMP Latest Ref Rng & Units 12/02/2019 12/01/2019 11/30/2019  Glucose 70 - 99 mg/dL 323(H) 329(H) 306(H)  BUN 6 - 20 mg/dL 10 9 11   Creatinine 0.44 - 1.00 mg/dL 0.49 0.58 0.56  Sodium 135 - 145 mmol/L 137 137 136  Potassium 3.5 - 5.1 mmol/L 3.8 3.9 3.7  Chloride 98 - 111 mmol/L 103 102 101  CO2 22 - 32 mmol/L 21(L) 21(L) 19(L)  Calcium 8.9 - 10.3 mg/dL 8.9 8.6(L) 9.1  Total Protein 6.0 - 8.3 g/dL - - -  Total Bilirubin 0.3 - 1.2 mg/dL - - -  Alkaline Phos 39 - 117 U/L - - -  AST 0 - 37 U/L - - -  ALT 0 - 35 U/L - - -   Lipid Panel     Component Value Date/Time   CHOL 82 (L)  04/05/2020 0923   TRIG 163 (H) 04/05/2020 0923   HDL 33 (L) 04/05/2020 0923   CHOLHDL 2.5 04/05/2020 0923   CHOLHDL 8.1 12/02/2019 0648   VLDL UNABLE TO CALCULATE IF TRIGLYCERIDE OVER 400 mg/dL 12/02/2019 0648   LDLCALC 22 04/05/2020 0923   LDLDIRECT 95.5 12/02/2019 0648    CBC    Component Value Date/Time   WBC 7.9 12/01/2019 0537   RBC 5.08 12/01/2019 0537   HGB 14.6 12/01/2019 0537   HCT 45.0 12/01/2019 0537   PLT 286 12/01/2019 0537   MCV 88.6 12/01/2019 0537   MCH 28.7 12/01/2019 0537   MCHC 32.4 12/01/2019 0537   RDW 13.6 12/01/2019 0537   LYMPHSABS 3.2 06/14/2013 2217   MONOABS 1.0 06/14/2013 2217   EOSABS 0.4 06/14/2013 2217   BASOSABS 0.0 06/14/2013 2217    ASSESSMENT AND PLAN: 1. Type 2 diabetes mellitus with peripheral neuropathy (HCC) Blood sugars not at goal.  I recommend increasing Lantus to 60 units daily.  If after 3 days her morning blood sugars are still greater than 130, then she will increase to 63 units.  Continue to monitor blood sugars.  She seems to be having some neuropathy symptoms in her feet.  I discussed management of diabetic neuropathy stressing the importance of better blood sugar control.  Also recommend starting a low-dose of gabapentin to take at bedtime.  Patient agreeable to this. -  POCT glucose (manual entry) - insulin glargine (LANTUS SOLOSTAR) 100 UNIT/ML Solostar Pen; 60 units subcut daily.  If after 3 days a.m BS still>130, increase to 63 units daily  Dispense: 15 mL; Refill: 6 - gabapentin (NEURONTIN) 300 MG capsule; Take 1 capsule (300 mg total) by mouth at bedtime.  Dispense: 90 capsule; Refill: 3  2. Essential hypertension At goal.  Continue current medications and low-salt diet.  3. Class 3 severe obesity due to excess calories with serious comorbidity and body mass index (BMI) of 40.0 to 44.9 in adult [facility]) Commended her on regular exercise and significant changes in her eating habits.  Encouraged her to keep up the good  works.  4. COVID-19 vaccine regimen to maintain immunity completed Patient advised to bring her card with her on next visit so that we can updated in health maintenance  5. Screening for colon cancer - Ambulatory referral to Gastroenterology  6. Family history of colon cancer - Ambulatory referral to Gastroenterology     Patient was given the opportunity to ask questions.  Patient verbalized understanding of the plan and was able to repeat key elements of the plan.   Orders Placed This Encounter  Procedures   Ambulatory referral to Gastroenterology   POCT glucose (manual entry)     Requested Prescriptions   Signed Prescriptions Disp Refills   insulin glargine (LANTUS SOLOSTAR) 100 UNIT/ML Solostar Pen 15 mL 6    Sig: 60 units subcut daily.  If after 3 days a.m BS still>130, increase to 63 units daily   gabapentin (NEURONTIN) 300 MG capsule 90 capsule 3    Sig: Take 1 capsule (300 mg total) by mouth at bedtime.    Return in about 4 months (around 09/28/2020).  Karle Plumber, MD, FACP

## 2020-05-31 NOTE — Patient Instructions (Signed)
We have started you on a low-dose of gabapentin 300 mg to take at bedtime for the neuropathy symptoms that you are having in your feet.  Keep up the good work with healthy eating habits and regular exercise.  We have increased the Lantus to 60 units daily.  If after 3 days your morning blood sugars before breakfast are still higher than 130, I recommend increasing the Lantus to 63 units daily.

## 2020-06-27 ENCOUNTER — Other Ambulatory Visit: Payer: Self-pay

## 2020-07-01 ENCOUNTER — Other Ambulatory Visit: Payer: Self-pay

## 2020-07-01 ENCOUNTER — Encounter: Payer: Self-pay | Admitting: Endocrinology

## 2020-07-01 ENCOUNTER — Other Ambulatory Visit: Payer: Self-pay | Admitting: Internal Medicine

## 2020-07-01 ENCOUNTER — Telehealth (INDEPENDENT_AMBULATORY_CARE_PROVIDER_SITE_OTHER): Payer: Medicaid Other | Admitting: Endocrinology

## 2020-07-01 VITALS — Ht 61.0 in | Wt 222.0 lb

## 2020-07-01 DIAGNOSIS — Z794 Long term (current) use of insulin: Secondary | ICD-10-CM

## 2020-07-01 DIAGNOSIS — E1165 Type 2 diabetes mellitus with hyperglycemia: Secondary | ICD-10-CM

## 2020-07-01 DIAGNOSIS — E1142 Type 2 diabetes mellitus with diabetic polyneuropathy: Secondary | ICD-10-CM

## 2020-07-01 MED ORDER — LANTUS SOLOSTAR 100 UNIT/ML ~~LOC~~ SOPN: 80.0000 [IU] | PEN_INJECTOR | SUBCUTANEOUS | 3 refills | Status: DC

## 2020-07-01 MED FILL — $OZEMPIC (1 MG/DOSE) 4 MG/3: 4 | 56 days supply | Qty: 6 | Fill #0

## 2020-07-01 NOTE — Telephone Encounter (Signed)
Requested Prescriptions  Pending Prescriptions Disp Refills  . OZEMPIC, 1 MG/DOSE, 4 MG/67ML SOPN [Pharmacy Med Name: OZEMPIC (1 MG/DOSE) 4 MG/67M 4 Solution Pen-injector] 3 mL 1    Sig: INJECT 1MG  INTO THE SKIN ONCE A WEEK     Endocrinology:  Diabetes - GLP-1 Receptor Agonists Failed - 07/01/2020 10:01 AM      Failed - HBA1C is between 0 and 7.9 and within 180 days    Hemoglobin A1C  Date Value Ref Range Status  03/14/2020 9.5 (A) 4.0 - 5.6 % Final   Hgb A1c MFr Bld  Date Value Ref Range Status  12/01/2019 11.5 (H) 4.8 - 5.6 % Final    Comment:    (NOTE) Pre diabetes:          5.7%-6.4%  Diabetes:              >6.4%  Glycemic control for   <7.0% adults with diabetes          Passed - Valid encounter within last 6 months    Recent Outpatient Visits          1 month ago Type 2 diabetes mellitus with peripheral neuropathy ([place])   [place], Neoma Laming B, MD   1 month ago Type 2 diabetes mellitus with hyperglycemia, with long-term current use of insulin ([place])   [place], Annie Main L, RPH-CPP   2 months ago Type 2 diabetes mellitus with hyperglycemia, with long-term current use of insulin [facility])   [place], Annie Main L, RPH-CPP   3 months ago Type 2 diabetes mellitus with hyperglycemia, with long-term current use of insulin [facility])   [place], Jarome Matin, RPH-CPP   4 months ago Type 2 diabetes mellitus with hyperglycemia, with long-term current use of insulin [facility])   [place], MD      Future Appointments            In 3 months Wynetta Emery, Dalbert Batman, MD [place]

## 2020-07-01 NOTE — Patient Instructions (Signed)
good diet and exercise significantly improve the control of your diabetes.  please let me know if you wish to be referred to a dietician.  high blood sugar is very risky to your health.  you should see an eye doctor and dentist every year.  It is very important to get all recommended vaccinations.  Controlling your blood pressure and cholesterol drastically reduces the damage diabetes does to your body.  Those who smoke should quit.  Please discuss these with your doctor.  check your blood sugar twice a day.  vary the time of day when you check, between before the 3 meals, and at bedtime.  also check if you have symptoms of your blood sugar being too high or too low.  please keep a record of the readings and bring it to your next appointment here (or you can bring the meter itself).  You can write it on any piece of paper.  please call us sooner if your blood sugar goes below 70, or if most of your readings are over 200. We will need to take this complex situation in stages.  For now, please:  Increase the lantus to 80 units each morning, and:  Please continue the same other medications.  Please come back for a follow-up appointment in 1 month.

## 2020-07-01 NOTE — Progress Notes (Signed)
Subjective:    Patient ID: Teresa Camacho, female    DOB: 21-Jun-1965, 55 y.o.   MRN: [id number]  HPI  telehealth visit today via video visit.  Alternatives to telehealth are presented to this patient, and the patient agrees to the telehealth visit. Pt is advised of the cost of the visit, and agrees to this, also.   Patient is at home, and I am at home.   Persons attending the telehealth visit: the patient and I pt is referred by Dr Wynetta Emery, for diabetes.  Pt states DM was dx'ed in mid-2021; she is unaware of any chronic complications; she has been on insulin since soon after dx; pt says her diet and exercise are good; she has never had GDM, pancreatitis, pancreatic surgery, severe hypoglycemia or DKA.  She takes Lantus 65/d, Ozempic, and metformin.  She says cbg varies from 180-215.  It is in general higher as the day goes on.   Past Medical History:  Diagnosis Date   Anemia    Blood transfusion without reported diagnosis    Fibroids, submucosal 05/03/2013   Hyperlipidemia    Hypertension    PCOS (polycystic ovarian syndrome)    PONV (postoperative nausea and vomiting)    Sleep apnea     Past Surgical History:  Procedure Laterality Date   ABDOMINAL HYSTERECTOMY N/A 06/13/2013   Procedure: HYSTERECTOMY ABDOMINAL and removal of vaginal skin tag;  Surgeon: Luz Lex, MD;  Location: [facility];  Service: Gynecology;  Laterality: N/A;   APPENDECTOMY     CESAREAN SECTION     3x   CHOLECYSTECTOMY     CHOLECYSTECTOMY  1991    Social History   Socioeconomic History   Marital status: Divorced    Spouse name: Not on file   Number of children: 5   Years of education: Not on file   Highest education level: Not on file  Occupational History   Not on file  Tobacco Use   Smoking status: Never Smoker   Smokeless tobacco: Never Used  Vaping Use   Vaping Use: Never used  Substance and Sexual Activity   Alcohol use: No   Drug use: No   Sexual activity: Never   Other Topics Concern   Not on file  Social History Narrative   Not on file   Social Determinants of Health   Financial Resource Strain: Not on file  Food Insecurity: Not on file  Transportation Needs: Not on file  Physical Activity: Not on file  Stress: Not on file  Social Connections: Not on file  Intimate Partner Violence: Not on file    Current Outpatient Medications on File Prior to Visit  Medication Sig Dispense Refill   acetaminophen (TYLENOL) 500 MG tablet Take 1,000 mg by mouth every 6 (six) hours as needed for mild pain.     aspirin EC 81 MG tablet Take 1 tablet (81 mg total) by mouth daily. Swallow whole. 100 tablet 2   atorvastatin (LIPITOR) 80 MG tablet Take 1 tablet (80 mg total) by mouth daily. 30 tablet 6   blood glucose meter kit and supplies KIT Dispense based on patient and insurance preference. Use up to four times daily as directed. (FOR ICD-9 250.00, 250.01). 1 each 0   Blood Glucose Monitoring Suppl (TRUE METRIX METER) w/Device KIT Use as directed 1 kit 0   Coenzyme Q10 (COQ10 PO) Take 1 capsule by mouth every evening.     fenofibrate (TRICOR) 48 MG tablet TAKE ONE TABLET BY  MOUTH DAILY 30 tablet 2   gabapentin (NEURONTIN) 300 MG capsule Take 1 capsule (300 mg total) by mouth at bedtime. 90 capsule 3   glucose blood (TRUE METRIX BLOOD GLUCOSE TEST) test strip Use as instructed 100 each 12   Insulin Pen Needle 32G X 4 MM MISC 1 each by Does not apply route in the morning and at bedtime. 100 each 1   lidocaine (LIDODERM) 5 % Place 1 patch onto the skin daily. Remove & Discard patch within 12 hours or as directed by MD 30 patch 0   lisinopril (ZESTRIL) 10 MG tablet Take 1 tablet (10 mg total) by mouth daily. 30 tablet 6   loratadine (CLARITIN) 10 MG tablet Take 1 tablet (10 mg total) by mouth daily. 30 tablet 6   MELATONIN PO Take 5 tablets by mouth daily as needed (sleep).      metFORMIN (GLUCOPHAGE) 1000 MG tablet Take 1 tablet (1,000 mg total)  by mouth 2 (two) times daily with a meal. 60 tablet 6   metoprolol tartrate (LOPRESSOR) 25 MG tablet Take 1 tablet (25 mg total) by mouth 2 (two) times daily. 60 tablet 6   Multiple Vitamins-Minerals (MULTIVITAMIN WITH MINERALS) tablet Take 1 tablet by mouth daily.     pantoprazole (PROTONIX) 40 MG tablet Take 1 tablet (40 mg total) by mouth daily. 30 tablet 6   Semaglutide, 1 MG/DOSE, (OZEMPIC, 1 MG/DOSE,) 2 MG/1.5ML SOPN Inject 1 mg into the skin once a week. 3 mL 2   TRUEplus Lancets 28G MISC Use as directed 100 each 4   VITAMIN D PO Take 1 tablet by mouth daily.     No current facility-administered medications on file prior to visit.    Allergies  Allergen Reactions   Codeine Nausea And Vomiting    Pt is able to take percocet & vicodin    Family History  Problem Relation Age of Onset   Cancer Mother    Depression Mother    Diabetes Mother    Arthritis Mother    Heart disease Mother    Learning disabilities Mother    Mental illness Mother    Cancer Father    Arthritis Father    Cancer Maternal Aunt        x 2 Aunts    Ht 5' 1"  (1.549 m)    Wt 222 lb (100.7 kg)    LMP 04/17/2013    BMI 41.95 kg/m   Review of Systems denies weight loss, blurry vision, sob, n/v, urinary frequency, and depression.      Objective:   Physical Exam    Lab Results  Component Value Date   HGBA1C 9.5 (A) 03/14/2020   Lab Results  Component Value Date   CREATININE 0.49 12/02/2019   BUN 10 12/02/2019   NA 137 12/02/2019   K 3.8 12/02/2019   CL 103 12/02/2019   CO2 21 (L) 12/02/2019   I have reviewed outside records, and summarized: Pt was noted to have elevated A1c, and referred here.  Wellness, HTN, and dyslipidemia were also addressed.       Assessment & Plan:  Insulin-requiring type 2 DM: uncontrolled.    Patient Instructions  good diet and exercise significantly improve the control of your diabetes.  please let me know if you wish to be referred to a  dietician.  high blood sugar is very risky to your health.  you should see an eye doctor and dentist every year.  It is very important to get  all recommended vaccinations.  Controlling your blood pressure and cholesterol drastically reduces the damage diabetes does to your body.  Those who smoke should quit.  Please discuss these with your doctor.  check your blood sugar twice a day.  vary the time of day when you check, between before the 3 meals, and at bedtime.  also check if you have symptoms of your blood sugar being too high or too low.  please keep a record of the readings and bring it to your next appointment here (or you can bring the meter itself).  You can write it on any piece of paper.  please call us sooner if your blood sugar goes below 70, or if most of your readings are over 200. We will need to take this complex situation in stages.  For now, please:  Increase the lantus to 80 units each morning, and:  Please continue the same other medications.  Please come back for a follow-up appointment in 1 month.

## 2020-07-02 MED FILL — TRUE METRIX GLUCOSE TEST ST: 30 days supply | Qty: 100 | Fill #3

## 2020-07-02 MED FILL — GABAPENTIN 300 MG CAPSULE: 300 | 30 days supply | Qty: 30 | Fill #1

## 2020-07-03 MED FILL — !LANTUS SOLOSTAR 100UNITS/M: 100 | 15 days supply | Qty: 12 | Fill #0

## 2020-07-09 ENCOUNTER — Telehealth: Payer: Self-pay | Admitting: Endocrinology

## 2020-07-09 NOTE — Telephone Encounter (Signed)
Pt called because after her recent appt, Dr Loanne Drilling increased her insulin and told her to give [place] a call back with her results. Pt states after a week her levels are still not under 100 and so she requests a nurse give her a call with the next dosage increase Dr wants her to try.  Ph# [phone]

## 2020-07-11 NOTE — Telephone Encounter (Signed)
Advise

## 2020-07-11 NOTE — Telephone Encounter (Signed)
Message sent thru MyChart for update on BS readings.

## 2020-07-11 NOTE — Telephone Encounter (Signed)
I need to know more about how the blood sugar are.  Please let me know.

## 2020-07-18 ENCOUNTER — Other Ambulatory Visit: Payer: Self-pay | Admitting: Endocrinology

## 2020-07-18 ENCOUNTER — Other Ambulatory Visit: Payer: Self-pay | Admitting: Pharmacist

## 2020-07-18 DIAGNOSIS — E1142 Type 2 diabetes mellitus with diabetic polyneuropathy: Secondary | ICD-10-CM

## 2020-07-18 MED ORDER — OZEMPIC (0.25 OR 0.5 MG/DOSE) 2 MG/1.5ML ~~LOC~~ SOPN: 0.5000 mg | PEN_INJECTOR | SUBCUTANEOUS | 2 refills | Status: DC

## 2020-07-18 MED ORDER — OZEMPIC (0.25 OR 0.5 MG/DOSE) 2 MG/1.5ML ~~LOC~~ SOPN: 0.5000 mg | PEN_INJECTOR | SUBCUTANEOUS | 3 refills | Status: DC

## 2020-07-18 MED ORDER — LANTUS SOLOSTAR 100 UNIT/ML ~~LOC~~ SOPN: 100.0000 [IU] | PEN_INJECTOR | SUBCUTANEOUS | 3 refills | Status: DC

## 2020-07-18 MED FILL — $OZEMPIC 0.25 OR 0.5 MG/DOS: 2 | 28 days supply | Qty: 1 | Fill #0

## 2020-07-18 MED FILL — $LANTUS SOLOSTAR 100 UNITS/: 100 | 30 days supply | Qty: 24 | Fill #1

## 2020-07-18 NOTE — Telephone Encounter (Signed)
Patient called to advise that updated prescriptions for Lantus 100 units and Ozempic 0.5 need to be sent to [facility].   Call back # [phone]

## 2020-07-19 ENCOUNTER — Telehealth: Payer: Self-pay | Admitting: Endocrinology

## 2020-07-19 NOTE — Telephone Encounter (Signed)
Pt's pharmacy called to let us know the prescription that was sent in was for 105 mL with 100 mL daily and pharmacy is confused. They state each pen is only 300 mL per pen so the information is conflicting. They need to know how many pens to give pt.  They ask for a nurse to give them a call for a verbal order at [phone] (can ask for [facility])

## 2020-07-22 NOTE — Telephone Encounter (Signed)
Please advise 

## 2020-07-22 NOTE — Telephone Encounter (Signed)
rx says 100 units, not 100 ml, so it is correct.

## 2020-07-26 ENCOUNTER — Other Ambulatory Visit: Payer: Self-pay | Admitting: Internal Medicine

## 2020-07-26 DIAGNOSIS — Z794 Long term (current) use of insulin: Secondary | ICD-10-CM

## 2020-07-26 DIAGNOSIS — E785 Hyperlipidemia, unspecified: Secondary | ICD-10-CM

## 2020-07-26 DIAGNOSIS — I10 Essential (primary) hypertension: Secondary | ICD-10-CM

## 2020-07-26 DIAGNOSIS — E1169 Type 2 diabetes mellitus with other specified complication: Secondary | ICD-10-CM

## 2020-07-26 DIAGNOSIS — K219 Gastro-esophageal reflux disease without esophagitis: Secondary | ICD-10-CM

## 2020-07-26 DIAGNOSIS — E1165 Type 2 diabetes mellitus with hyperglycemia: Secondary | ICD-10-CM

## 2020-08-07 ENCOUNTER — Ambulatory Visit (INDEPENDENT_AMBULATORY_CARE_PROVIDER_SITE_OTHER): Payer: Self-pay | Admitting: Internal Medicine

## 2020-08-07 ENCOUNTER — Encounter: Payer: Self-pay | Admitting: Internal Medicine

## 2020-08-07 ENCOUNTER — Other Ambulatory Visit: Payer: Self-pay | Admitting: Internal Medicine

## 2020-08-07 VITALS — BP 110/66 | HR 94 | Ht 61.0 in | Wt 227.0 lb

## 2020-08-07 DIAGNOSIS — Z794 Long term (current) use of insulin: Secondary | ICD-10-CM

## 2020-08-07 DIAGNOSIS — E119 Type 2 diabetes mellitus without complications: Secondary | ICD-10-CM

## 2020-08-07 DIAGNOSIS — Z1211 Encounter for screening for malignant neoplasm of colon: Secondary | ICD-10-CM

## 2020-08-07 DIAGNOSIS — T884XXA Failed or difficult intubation, initial encounter: Secondary | ICD-10-CM

## 2020-08-07 DIAGNOSIS — R1310 Dysphagia, unspecified: Secondary | ICD-10-CM

## 2020-08-07 DIAGNOSIS — K219 Gastro-esophageal reflux disease without esophagitis: Secondary | ICD-10-CM

## 2020-08-07 MED ORDER — NA SULFATE-K SULFATE-MG SULF 17.5-3.13-1.6 GM/177ML PO SOLN: 1.0000 | Freq: Once | ORAL | 0 refills | Status: DC

## 2020-08-07 MED FILL — $LANTUS SOLOSTAR 100 UNITS/: 100 | 51 days supply | Qty: 51 | Fill #0

## 2020-08-07 MED FILL — SUPREP BOWEL PREP KIT: 17.5-3.13-1 | 1 days supply | Qty: 354 | Fill #0

## 2020-08-07 NOTE — Progress Notes (Addendum)
HISTORY OF PRESENT ILLNESS:  Teresa Camacho is a 55 y.o. 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  Also, problems with chronic GERD and dysphagia.  Patient tells me that she has had problems with chronic reflux for 30 years.  This includes the classic symptoms of pyrosis and regurgitation.  She is currently on pantoprazole 40 mg daily.  She takes this in the morning.  Despite compliance, she has significant breakthrough pyrosis 3-4 times per week for which she takes Pepcid Complete.  She has also experienced intermittent solid food and pill dysphagia over the past 3 years.  Occasionally needs to regurgitate or vomit foods for relief.  She has gained 20 pounds over the past year or so.  She has not had colon cancer screening.  She believes that her mother may have had colon cancer metastatic to the liver.  She also believes that her father may have had esophageal cancer.  She does have a sister with precancerous polyps.  Patient does not have a history of smoking or alcohol use.  She has been described as a DIFFICULT AIRWAY at the time of her hysterectomy surgery.  She has completed her COVID vaccination series and booster.  Review of outside blood work from November 2021 includes a lipid panel with total cholesterol of 82.  Last hemoglobin A1c 9.5.  Review of outside radiology include CT scan of the abdomen and pelvis with contrast January 2015 found to have an indeterminate right renal lesion.  No acute abdominal issues.  She is status post both appendectomy and cholecystectomy as well as hysterectomy  REVIEW OF SYSTEMS:  All non-GI ROS negative except for  Past Medical History:  Diagnosis Date  . Anemia   . Blood transfusion without reported diagnosis   . Fibroids, submucosal 05/03/2013  . GERD  (gastroesophageal reflux disease)   . Hyperlipidemia   . Hypertension   . PCOS (polycystic ovarian syndrome)   . PONV (postoperative nausea and vomiting)   . Sleep apnea   . Type II diabetes mellitus ([place])     Past Surgical History:  Procedure Laterality Date  . ABDOMINAL HYSTERECTOMY N/A 06/13/2013   Procedure: HYSTERECTOMY ABDOMINAL and removal of vaginal skin tag;  Surgeon: Luz Lex, MD;  Location: [facility];  Service: Gynecology;  Laterality: N/A;  . APPENDECTOMY    . CESAREAN SECTION     3x  . CHOLECYSTECTOMY    . [place]  reports that she has never smoked. She has never used smokeless tobacco. She reports that she does not drink alcohol and does not use drugs.  family history includes Arthritis in her father and mother; Cancer in her maternal aunt; Colon polyps in her sister; Depression in her mother; Diabetes in her mother; Heart disease in her mother; Learning disabilities in her mother; Liver cancer in her mother; Mental illness in her mother; Prostate cancer in her father.  Allergies  Allergen Reactions  . Codeine Nausea And Vomiting    Pt is able to take percocet & vicodin       PHYSICAL EXAMINATION: Vital signs: BP 110/66   Pulse 94   Ht 5\' 1"  (1.549 m)   Wt 227 lb (103 kg)   LMP 04/17/2013   SpO2 97%   BMI 42.89 kg/m   Constitutional: Pleasant, generally  well-appearing, no acute distress Psychiatric: alert and oriented x3, cooperative Eyes: extraocular movements intact, anicteric, conjunctiva pink Mouth: oral pharynx moist, no lesions Neck: supple no lymphadenopathy Cardiovascular: heart regular rate and rhythm, no murmur Lungs: clear to auscultation bilaterally Abdomen: soft, obese, nontender, nondistended, no obvious ascites, no peritoneal signs, normal bowel sounds, no organomegaly Rectal: Deferred till colonoscopy Extremities: no clubbing, cyanosis, or lower extremity edema bilaterally Skin: no lesions  on visible extremities Neuro: No focal deficits.  Cranial nerves intact  ASSESSMENT:  1.  Chronic GERD.  Significant breakthrough symptoms on pantoprazole 40 mg daily 2.  Intermittent solid food and pill dysphagia.  Rule out peptic stricture 3.  Morbid obesity 4.  Diabetes mellitus requiring insulin 5.  Described as difficult airway at the time of prior surgery 6.  Possible family history of colon cancer in her mother and esophageal cancer in her father 59.  General medical problems care of Dr. Wynetta Emery   PLAN:  1.  Reflux precautions with attention to weight loss 2.  Prescribe pantoprazole 40 mg TWICE daily.  This to address breakthrough symptoms which are quite frequent.  Medication risks reviewed 3.  Schedule upper endoscopy with esophageal dilation.  Patient is HIGH RISK given her body habitus, comorbidities, and difficult airway.  This will be performed in the hospital with monitored anesthesia care.The nature of the procedure, as well as the risks, benefits, and alternatives were carefully and thoroughly reviewed with the patient. Ample time for discussion and questions allowed. The patient understood, was satisfied, and agreed to proceed. 4.  Schedule screening colonoscopy.  Again the patient is high risk as above.The nature of the procedure, as well as the risks, benefits, and alternatives were carefully and thoroughly reviewed with the patient. Ample time for discussion and questions allowed. The patient understood, was satisfied, and agreed to proceed. 5.  Hold diabetic medications the day of the examinations in order to avoid unwanted hypoglycemia 6.  Ongoing general medical care with Dr. Wynetta Emery

## 2020-08-07 NOTE — Patient Instructions (Signed)
If you are age 54 or older, your body mass index should be between 23-30. Your Body mass index is 42.89 kg/m. If this is out of the aforementioned range listed, please consider follow up with your Primary Care Provider.  If you are age 28 or younger, your body mass index should be between 19-25. Your Body mass index is 42.89 kg/m. If this is out of the aformentioned range listed, please consider follow up with your Primary Care Provider.   You have been scheduled for an endoscopy and colonoscopy. Please follow the written instructions given to you at your visit today. Please pick up your prep supplies at the pharmacy within the next 1-3 days. If you use inhalers (even only as needed), please bring them with you on the day of your procedure.

## 2020-08-07 NOTE — H&P (View-Only) (Signed)
HISTORY OF PRESENT ILLNESS:  Teresa Camacho is a 55 y.o. 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  Also, problems with chronic GERD and dysphagia.  Patient tells me that she has had problems with chronic reflux for 30 years.  This includes the classic symptoms of pyrosis and regurgitation.  She is currently on pantoprazole 40 mg daily.  She takes this in the morning.  Despite compliance, she has significant breakthrough pyrosis 3-4 times per week for which she takes Pepcid Complete.  She has also experienced intermittent solid food and pill dysphagia over the past 3 years.  Occasionally needs to regurgitate or vomit foods for relief.  She has gained 20 pounds over the past year or so.  She has not had colon cancer screening.  She believes that her mother may have had colon cancer metastatic to the liver.  She also believes that her father may have had esophageal cancer.  She does have a sister with precancerous polyps.  Patient does not have a history of smoking or alcohol use.  She has been described as a DIFFICULT AIRWAY at the time of her hysterectomy surgery.  She has completed her COVID vaccination series and booster.  Review of outside blood work from November 2021 includes a lipid panel with total cholesterol of 82.  Last hemoglobin A1c 9.5.  Review of outside radiology include CT scan of the abdomen and pelvis with contrast January 2015 found to have an indeterminate right renal lesion.  No acute abdominal issues.  She is status post both appendectomy and cholecystectomy as well as hysterectomy  REVIEW OF SYSTEMS:  All non-GI ROS negative except for  Past Medical History:  Diagnosis Date  . Anemia   . Blood transfusion without reported diagnosis   . Fibroids, submucosal 05/03/2013  . GERD  (gastroesophageal reflux disease)   . Hyperlipidemia   . Hypertension   . PCOS (polycystic ovarian syndrome)   . PONV (postoperative nausea and vomiting)   . Sleep apnea   . Type II diabetes mellitus ([place])     Past Surgical History:  Procedure Laterality Date  . ABDOMINAL HYSTERECTOMY N/A 06/13/2013   Procedure: HYSTERECTOMY ABDOMINAL and removal of vaginal skin tag;  Surgeon: Luz Lex, MD;  Location: [facility];  Service: Gynecology;  Laterality: N/A;  . APPENDECTOMY    . CESAREAN SECTION     3x  . CHOLECYSTECTOMY    . [place]  reports that she has never smoked. She has never used smokeless tobacco. She reports that she does not drink alcohol and does not use drugs.  family history includes Arthritis in her father and mother; Cancer in her maternal aunt; Colon polyps in her sister; Depression in her mother; Diabetes in her mother; Heart disease in her mother; Learning disabilities in her mother; Liver cancer in her mother; Mental illness in her mother; Prostate cancer in her father.  Allergies  Allergen Reactions  . Codeine Nausea And Vomiting    Pt is able to take percocet & vicodin       PHYSICAL EXAMINATION: Vital signs: BP 110/66   Pulse 94   Ht 5\' 1"  (1.549 m)   Wt 227 lb (103 kg)   LMP 04/17/2013   SpO2 97%   BMI 42.89 kg/m   Constitutional: Pleasant, generally  well-appearing, no acute distress Psychiatric: alert and oriented x3, cooperative Eyes: extraocular movements intact, anicteric, conjunctiva pink Mouth: oral pharynx moist, no lesions Neck: supple no lymphadenopathy Cardiovascular: heart regular rate and rhythm, no murmur Lungs: clear to auscultation bilaterally Abdomen: soft, obese, nontender, nondistended, no obvious ascites, no peritoneal signs, normal bowel sounds, no organomegaly Rectal: Deferred till colonoscopy Extremities: no clubbing, cyanosis, or lower extremity edema bilaterally Skin: no lesions  on visible extremities Neuro: No focal deficits.  Cranial nerves intact  ASSESSMENT:  1.  Chronic GERD.  Significant breakthrough symptoms on pantoprazole 40 mg daily 2.  Intermittent solid food and pill dysphagia.  Rule out peptic stricture 3.  Morbid obesity 4.  Diabetes mellitus requiring insulin 5.  Described as difficult airway at the time of prior surgery 6.  Possible family history of colon cancer in her mother and esophageal cancer in her father 105.  General medical problems care of Dr. Wynetta Emery   PLAN:  1.  Reflux precautions with attention to weight loss 2.  Prescribe pantoprazole 40 mg TWICE daily.  This to address breakthrough symptoms which are quite frequent.  Medication risks reviewed 3.  Schedule upper endoscopy with esophageal dilation.  Patient is HIGH RISK given her body habitus, comorbidities, and difficult airway.  This will be performed in the hospital with monitored anesthesia care.The nature of the procedure, as well as the risks, benefits, and alternatives were carefully and thoroughly reviewed with the patient. Ample time for discussion and questions allowed. The patient understood, was satisfied, and agreed to proceed. 4.  Schedule screening colonoscopy.  Again the patient is high risk as above.The nature of the procedure, as well as the risks, benefits, and alternatives were carefully and thoroughly reviewed with the patient. Ample time for discussion and questions allowed. The patient understood, was satisfied, and agreed to proceed. 5.  Hold diabetic medications the day of the examinations in order to avoid unwanted hypoglycemia 6.  Ongoing general medical care with Dr. Wynetta Emery

## 2020-08-16 ENCOUNTER — Encounter: Payer: Self-pay | Admitting: Endocrinology

## 2020-08-16 NOTE — Telephone Encounter (Signed)
Appt set for Monday 4/4 at 8 am

## 2020-08-17 ENCOUNTER — Other Ambulatory Visit: Payer: Self-pay

## 2020-08-18 ENCOUNTER — Other Ambulatory Visit: Payer: Self-pay

## 2020-08-18 MED FILL — Gabapentin Cap 300 MG: ORAL | 30 days supply | Qty: 30 | Fill #0 | Status: AC

## 2020-08-18 MED FILL — Glucose Blood Test Strip: 25 days supply | Qty: 100 | Fill #0 | Status: AC

## 2020-08-19 ENCOUNTER — Other Ambulatory Visit: Payer: Self-pay

## 2020-08-19 ENCOUNTER — Ambulatory Visit (INDEPENDENT_AMBULATORY_CARE_PROVIDER_SITE_OTHER): Payer: Medicaid Other | Admitting: Endocrinology

## 2020-08-19 VITALS — BP 118/70 | HR 90 | Ht 61.0 in | Wt 226.4 lb

## 2020-08-19 DIAGNOSIS — E1165 Type 2 diabetes mellitus with hyperglycemia: Secondary | ICD-10-CM

## 2020-08-19 DIAGNOSIS — Z794 Long term (current) use of insulin: Secondary | ICD-10-CM

## 2020-08-19 DIAGNOSIS — E1142 Type 2 diabetes mellitus with diabetic polyneuropathy: Secondary | ICD-10-CM

## 2020-08-19 LAB — POCT GLYCOSYLATED HEMOGLOBIN (HGB A1C): Hemoglobin A1C: 8.4 % — AB (ref 4.0–5.6)

## 2020-08-19 MED ORDER — SEMAGLUTIDE(0.25 OR 0.5MG/DOS) 2 MG/1.5ML ~~LOC~~ SOPN
0.2500 mg | PEN_INJECTOR | SUBCUTANEOUS | 3 refills | Status: DC
  Filled 2020-08-19: qty 1.5, 56d supply, fill #0
  Filled 2020-10-16: qty 1.5, 56d supply, fill #1
  Filled 2020-12-06: qty 1.5, 56d supply, fill #2
  Filled 2021-02-03: qty 1.5, 56d supply, fill #3

## 2020-08-19 MED ORDER — METFORMIN HCL ER 500 MG PO TB24: 2000.0000 mg | ORAL_TABLET | Freq: Every day | ORAL | 3 refills | Status: DC

## 2020-08-19 MED FILL — Lancets: 25 days supply | Qty: 100 | Fill #0 | Status: AC

## 2020-08-19 NOTE — Patient Instructions (Addendum)
check your blood sugar twice a day.  vary the time of day when you check, between before the 3 meals, and at bedtime.  also check if you have symptoms of your blood sugar being too high or too low.  please keep a record of the readings and bring it to your next appointment here (or you can bring the meter itself).  You can write it on any piece of paper.  please call us sooner if your blood sugar goes below 70, or if most of your readings are over 200.    Please increase the lantus to 120 units each morning, and:  Reduce the Ozempic to 0.25 mg weekly, and: Change the metformin to extended-release.   Please come back for a follow-up appointment in 2 months.

## 2020-08-19 NOTE — Progress Notes (Signed)
Subjective:    Patient ID: Teresa Camacho, female    DOB: 07/29/1965, 55 y.o.   MRN: [id number]  HPI Pt returns for f/u of diabetes mellitus:  DM type:  Dx'ed: 8453 Complications: none Therapy: insulin since soon after dx, Ozempic, and metformin.  GDM: never DKA: never Severe hypoglycemia: never Pancreatitis: never Pancreatic imaging: normal on 2015 CT SDOH: none Other: she takes qd insulin, at least for now.  Interval history: Since Ozempic was decreased, abd bloating and belching are improved but not resolved.  she brings a record of her cbg's today.  cbg varies from 161-360.   Past Medical History:  Diagnosis Date  . Anemia   . Blood transfusion without reported diagnosis   . Fibroids, submucosal 05/03/2013  . GERD (gastroesophageal reflux disease)   . Hyperlipidemia   . Hypertension   . PCOS (polycystic ovarian syndrome)   . PONV (postoperative nausea and vomiting)   . Sleep apnea   . Type II diabetes mellitus ([place])     Past Surgical History:  Procedure Laterality Date  . ABDOMINAL HYSTERECTOMY N/A 06/13/2013   Procedure: HYSTERECTOMY ABDOMINAL and removal of vaginal skin tag;  Surgeon: Luz Lex, MD;  Location: [facility];  Service: Gynecology;  Laterality: N/A;  . APPENDECTOMY    . CESAREAN SECTION     3x  . CHOLECYSTECTOMY    . CHOLECYSTECTOMY  1991    Social History   Socioeconomic History  . Marital status: Divorced    Spouse name: Not on file  . Number of children: 5  . Years of education: Not on file  . Highest education level: Not on file  Occupational History  . Not on file  Tobacco Use  . Smoking status: Never Smoker  . Smokeless tobacco: Never Used  Vaping Use  . Vaping Use: Never used  Substance and Sexual Activity  . Alcohol use: No  . Drug use: No  . Sexual activity: Never  Other Topics Concern  . Not on file  Social History Narrative  . Not on file   Social Determinants of Health   Financial Resource Strain: Not on file  Food  Insecurity: Not on file  Transportation Needs: Not on file  Physical Activity: Not on file  Stress: Not on file  Social Connections: Not on file  Intimate Partner Violence: Not on file    Current Outpatient Medications on File Prior to Visit  Medication Sig Dispense Refill  . acetaminophen (TYLENOL) 500 MG tablet Take 1,000 mg by mouth every 6 (six) hours as needed for mild pain.    Marland Kitchen aspirin EC 81 MG tablet Take 1 tablet (81 mg total) by mouth daily. Swallow whole. (Patient taking differently: Take 81 mg by mouth in the morning. Swallow whole.) 100 tablet 2  . atorvastatin (LIPITOR) 80 MG tablet TAKE ONE TABLET BY MOUTH DAILY (Patient taking differently: Take 80 mg by mouth every evening.) 30 tablet 2  . blood glucose meter kit and supplies KIT Dispense based on patient and insurance preference. Use up to four times daily as directed. (FOR ICD-9 250.00, 250.01). 1 each 0  . Blood Glucose Monitoring Suppl (TRUE METRIX METER) w/Device KIT Use as directed 1 kit 0  . Cholecalciferol (VITAMIN D-3) 125 MCG (5000 UT) TABS Take 5,000 Units by mouth in the morning.    . desloratadine (CLARINEX) 5 MG tablet Take 5 mg by mouth at bedtime.    . diphenhydrAMINE (BENADRYL) 25 mg capsule Take 50 mg by mouth  at bedtime.    . fenofibrate (TRICOR) 48 MG tablet TAKE ONE TABLET BY MOUTH DAILY (Patient taking differently: Take 48 mg by mouth in the morning.) 30 tablet 2  . gabapentin (NEURONTIN) 300 MG capsule TAKE 1 CAPSULE (300 MG TOTAL) BY MOUTH AT BEDTIME. (Patient taking differently: Take 600 mg by mouth at bedtime.) 90 capsule 3  . glucose blood test strip USE AS INSTRUCTED 100 strip 12  . insulin glargine (LANTUS SOLOSTAR) 100 UNIT/ML Solostar Pen Inject 100 Units into the skin every morning. (Patient taking differently: Inject 100 Units into the skin in the morning.) 105 mL 3  . Insulin Pen Needle 32G X 4 MM MISC 1 each by Does not apply route in the morning and at bedtime. 100 each 1  . lisinopril  (ZESTRIL) 10 MG tablet TAKE ONE TABLET BY MOUTH DAILY (Patient taking differently: Take 10 mg by mouth in the morning.) 30 tablet 2  . loratadine (CLARITIN) 10 MG tablet Take 1 tablet (10 mg total) by mouth daily. (Patient not taking: Reported on 08/16/2020) 30 tablet 6  . Melatonin 10 MG SUBL Place 20 mg under the tongue at bedtime.    . metoprolol tartrate (LOPRESSOR) 25 MG tablet TAKE ONE TABLET BY MOUTH TWICE A DAY (Patient taking differently: Take 25 mg by mouth 2 (two) times daily.) 60 tablet 2  . Multiple Vitamins-Minerals (MULTIVITAMIN WITH MINERALS) tablet Take 1 tablet by mouth daily.    . Na Sulfate-K Sulfate-Mg Sulf 17.5-3.13-1.6 GM/177ML SOLN TAKE AS 1 TIME DOSE 354 mL 0  . pantoprazole (PROTONIX) 40 MG tablet TAKE ONE TABLET BY MOUTH DAILY (Patient taking differently: Take 40 mg by mouth 2 (two) times daily.) 30 tablet 2  . traZODone (DESYREL) 100 MG tablet Take 100 mg by mouth at bedtime.    . TRUEplus Lancets 28G MISC USE AS DIRECTED 100 each 4   No current facility-administered medications on file prior to visit.    Allergies  Allergen Reactions  . Wound Dressing Adhesive Other (See Comments)    Redness/irritation  . Codeine Nausea And Vomiting    Pt is able to take percocet & vicodin  . Latex Rash    irritation    Family History  Problem Relation Age of Onset  . Depression Mother   . Diabetes Mother   . Arthritis Mother   . Heart disease Mother   . Learning disabilities Mother   . Mental illness Mother   . Liver cancer Mother        metastatic from colon??  . Arthritis Father   . Prostate cancer Father        met to esophagus  . Colon polyps Sister   . Cancer Maternal Aunt        x 2 Aunts    BP 118/70 (BP Location: Right Arm, Patient Position: Sitting, Cuff Size: Large)   Pulse 90   Ht _0  (1.549 m)   Wt 226 lb 6.4 oz (102.7 kg)   LMP 04/17/2013   SpO2 97%   BMI 42.78 kg/m    Review of Systems She denies hypoglycemia    Objective:   Physical  Exam VITAL SIGNS:  See vs page GENERAL: no distress Pulses: dorsalis pedis intact bilat.   MSK: no deformity of the feet CV: trace bilat leg edema Skin:  no ulcer on the feet.  normal color and temp on the feet. Neuro: sensation is intact to touch on the feet.    A1c=8.4%  Assessment & Plan:  Belching, due to metformin, Ozempic, or both. Insulin-requiring type 2 DM: uncontrolled.  Patient Instructions  check your blood sugar twice a day.  vary the time of day when you check, between before the 3 meals, and at bedtime.  also check if you have symptoms of your blood sugar being too high or too low.  please keep a record of the readings and bring it to your next appointment here (or you can bring the meter itself).  You can write it on any piece of paper.  please call us sooner if your blood sugar goes below 70, or if most of your readings are over 200.    Please increase the lantus to 120 units each morning, and:  Reduce the Ozempic to 0.25 mg weekly, and: Change the metformin to extended-release.   Please come back for a follow-up appointment in 2 months.

## 2020-08-21 ENCOUNTER — Encounter (HOSPITAL_COMMUNITY): Payer: Self-pay | Admitting: Internal Medicine

## 2020-08-22 ENCOUNTER — Other Ambulatory Visit: Payer: Self-pay

## 2020-08-22 DIAGNOSIS — K219 Gastro-esophageal reflux disease without esophagitis: Secondary | ICD-10-CM

## 2020-08-22 MED ORDER — PANTOPRAZOLE SODIUM 40 MG PO TBEC: 40.0000 mg | DELAYED_RELEASE_TABLET | Freq: Two times a day (BID) | ORAL | 3 refills | Status: DC

## 2020-08-23 ENCOUNTER — Other Ambulatory Visit (HOSPITAL_COMMUNITY)
Admission: RE | Admit: 2020-08-23 | Discharge: 2020-08-23 | Disposition: A | Discharge status: Home / Self Care | Payer: Medicaid Other | Source: Ambulatory Visit | Attending: Internal Medicine | Admitting: Internal Medicine

## 2020-08-23 DIAGNOSIS — Z20822 Contact with and (suspected) exposure to covid-19: Secondary | ICD-10-CM | POA: Insufficient documentation

## 2020-08-23 DIAGNOSIS — Z01812 Encounter for preprocedural laboratory examination: Secondary | ICD-10-CM | POA: Diagnosis not present

## 2020-08-23 LAB — SARS CORONAVIRUS 2 (TAT 6-24 HRS): SARS Coronavirus 2: NEGATIVE

## 2020-08-26 ENCOUNTER — Other Ambulatory Visit: Payer: Self-pay | Admitting: Internal Medicine

## 2020-08-26 DIAGNOSIS — E1169 Type 2 diabetes mellitus with other specified complication: Secondary | ICD-10-CM

## 2020-08-26 NOTE — Telephone Encounter (Signed)
Notes to clinic: Patient has upcoming appointment on 09/30/2020 Review for supply until that time   Requested Prescriptions  Pending Prescriptions Disp Refills   fenofibrate (TRICOR) 48 MG tablet [Pharmacy Med Name: FENOFIBRATE 48 MG TABLET] 30 tablet 2    Sig: TAKE ONE TABLET BY MOUTH DAILY      Cardiovascular:  Antilipid - Fibric Acid Derivatives Failed - 08/26/2020  6:21 AM      Failed - Total Cholesterol in normal range and within 360 days    Cholesterol, Total  Date Value Ref Range Status  04/05/2020 82 (L) 100 - 199 mg/dL Final          Failed - HDL in normal range and within 360 days    HDL  Date Value Ref Range Status  04/05/2020 33 (L) >39 mg/dL Final          Failed - Triglycerides in normal range and within 360 days    Triglycerides  Date Value Ref Range Status  04/05/2020 163 (H) 0 - 149 mg/dL Final          Failed - ALT in normal range and within 180 days    ALT  Date Value Ref Range Status  06/14/2013 11 0 - 35 U/L Final          Failed - AST in normal range and within 180 days    AST  Date Value Ref Range Status  06/14/2013 11 0 - 37 U/L Final          Failed - Cr in normal range and within 180 days    Creatinine, Ser  Date Value Ref Range Status  12/02/2019 0.49 0.44 - 1.00 mg/dL Final          Failed - eGFR in normal range and within 180 days    GFR calc Af Amer  Date Value Ref Range Status  12/02/2019 >60 >60 mL/min Final   GFR calc non Af Amer  Date Value Ref Range Status  12/02/2019 >60 >60 mL/min Final          Passed - LDL in normal range and within 360 days    LDL Chol Calc (NIH)  Date Value Ref Range Status  04/05/2020 22 0 - 99 mg/dL Final   Direct LDL  Date Value Ref Range Status  12/02/2019 95.5 0 - 99 mg/dL Final    Comment:    Performed at [facility], [address] [place], [place] [address]          Passed - Valid encounter within last 12 months    Recent Outpatient Visits           2 months ago  Type 2 diabetes mellitus with peripheral neuropathy [facility])   [place] Karle Plumber B, MD   3 months ago Type 2 diabetes mellitus with hyperglycemia, with long-term current use of insulin [facility])   [place], Annie Main L, RPH-CPP   4 months ago Type 2 diabetes mellitus with hyperglycemia, with long-term current use of insulin [facility])   [place], Annie Main L, RPH-CPP   5 months ago Type 2 diabetes mellitus with hyperglycemia, with long-term current use of insulin [facility])   [place], Annie Main L, RPH-CPP   5 months ago Type 2 diabetes mellitus with hyperglycemia, with long-term current use of insulin ([place])   [place], MD       Future Appointments             In 1 month Wynetta Emery Dalbert Batman, MD [place]   In 2 months Renato Shin, MD [facility] Endocrinology

## 2020-08-27 ENCOUNTER — Other Ambulatory Visit: Payer: Self-pay

## 2020-08-27 ENCOUNTER — Encounter (HOSPITAL_COMMUNITY): Payer: Self-pay | Admitting: Internal Medicine

## 2020-08-27 ENCOUNTER — Ambulatory Visit (HOSPITAL_COMMUNITY): Payer: Self-pay | Admitting: Certified Registered Nurse Anesthetist

## 2020-08-27 ENCOUNTER — Ambulatory Visit (HOSPITAL_COMMUNITY)
Admission: RE | Admit: 2020-08-27 | Discharge: 2020-08-27 | Disposition: A | Discharge status: Home / Self Care | Payer: Self-pay | Attending: Internal Medicine | Admitting: Internal Medicine

## 2020-08-27 ENCOUNTER — Encounter (HOSPITAL_COMMUNITY)
Admission: RE | Disposition: A | Discharge status: Home / Self Care | Payer: Self-pay | Source: Home / Self Care | Attending: Internal Medicine

## 2020-08-27 DIAGNOSIS — R1310 Dysphagia, unspecified: Secondary | ICD-10-CM | POA: Insufficient documentation

## 2020-08-27 DIAGNOSIS — K5730 Diverticulosis of large intestine without perforation or abscess without bleeding: Secondary | ICD-10-CM | POA: Insufficient documentation

## 2020-08-27 DIAGNOSIS — Z818 Family history of other mental and behavioral disorders: Secondary | ICD-10-CM | POA: Insufficient documentation

## 2020-08-27 DIAGNOSIS — Z809 Family history of malignant neoplasm, unspecified: Secondary | ICD-10-CM | POA: Insufficient documentation

## 2020-08-27 DIAGNOSIS — E119 Type 2 diabetes mellitus without complications: Secondary | ICD-10-CM | POA: Insufficient documentation

## 2020-08-27 DIAGNOSIS — Z8371 Family history of colonic polyps: Secondary | ICD-10-CM | POA: Insufficient documentation

## 2020-08-27 DIAGNOSIS — Z8042 Family history of malignant neoplasm of prostate: Secondary | ICD-10-CM | POA: Insufficient documentation

## 2020-08-27 DIAGNOSIS — Z885 Allergy status to narcotic agent status: Secondary | ICD-10-CM | POA: Insufficient documentation

## 2020-08-27 DIAGNOSIS — Z1211 Encounter for screening for malignant neoplasm of colon: Secondary | ICD-10-CM | POA: Insufficient documentation

## 2020-08-27 DIAGNOSIS — Z794 Long term (current) use of insulin: Secondary | ICD-10-CM | POA: Insufficient documentation

## 2020-08-27 DIAGNOSIS — I10 Essential (primary) hypertension: Secondary | ICD-10-CM | POA: Insufficient documentation

## 2020-08-27 DIAGNOSIS — Z800 Family history of malignant neoplasm of digestive organs: Secondary | ICD-10-CM | POA: Insufficient documentation

## 2020-08-27 DIAGNOSIS — K219 Gastro-esophageal reflux disease without esophagitis: Secondary | ICD-10-CM | POA: Insufficient documentation

## 2020-08-27 DIAGNOSIS — D123 Benign neoplasm of transverse colon: Secondary | ICD-10-CM | POA: Insufficient documentation

## 2020-08-27 DIAGNOSIS — Z833 Family history of diabetes mellitus: Secondary | ICD-10-CM | POA: Insufficient documentation

## 2020-08-27 DIAGNOSIS — K222 Esophageal obstruction: Secondary | ICD-10-CM | POA: Insufficient documentation

## 2020-08-27 DIAGNOSIS — Z8261 Family history of arthritis: Secondary | ICD-10-CM | POA: Insufficient documentation

## 2020-08-27 HISTORY — PX: COLONOSCOPY WITH PROPOFOL: SHX5780

## 2020-08-27 HISTORY — PX: POLYPECTOMY: SHX5525

## 2020-08-27 HISTORY — PX: BALLOON DILATION: SHX5330

## 2020-08-27 HISTORY — PX: ESOPHAGOGASTRODUODENOSCOPY (EGD) WITH PROPOFOL: SHX5813

## 2020-08-27 LAB — GLUCOSE, CAPILLARY: Glucose-Capillary: 183 mg/dL — ABNORMAL HIGH (ref 70–99)

## 2020-08-27 SURGERY — COLONOSCOPY WITH PROPOFOL
Anesthesia: Monitor Anesthesia Care

## 2020-08-27 MED ORDER — PROPOFOL 500 MG/50ML IV EMUL
INTRAVENOUS | Status: AC
  Filled 2020-08-27: qty 50

## 2020-08-27 MED ORDER — LIDOCAINE 2% (20 MG/ML) 5 ML SYRINGE
INTRAMUSCULAR | Status: DC | PRN
  Administered 2020-08-27: 60 mg via INTRAVENOUS

## 2020-08-27 MED ORDER — PHENYLEPHRINE 40 MCG/ML (10ML) SYRINGE FOR IV PUSH (FOR BLOOD PRESSURE SUPPORT)
PREFILLED_SYRINGE | INTRAVENOUS | Status: DC | PRN
  Administered 2020-08-27 (×2): 80 ug via INTRAVENOUS

## 2020-08-27 MED ORDER — SODIUM CHLORIDE 0.9 % IV SOLN: INTRAVENOUS | Status: DC

## 2020-08-27 MED ORDER — PROPOFOL 10 MG/ML IV BOLUS
INTRAVENOUS | Status: AC
  Filled 2020-08-27: qty 20

## 2020-08-27 MED ORDER — PROPOFOL 10 MG/ML IV BOLUS
INTRAVENOUS | Status: DC | PRN
  Administered 2020-08-27 (×3): 10 mg via INTRAVENOUS

## 2020-08-27 MED ORDER — LACTATED RINGERS IV SOLN: INTRAVENOUS | Status: DC | PRN

## 2020-08-27 MED ORDER — PROPOFOL 500 MG/50ML IV EMUL
INTRAVENOUS | Status: DC | PRN
  Administered 2020-08-27: 125 ug/kg/min via INTRAVENOUS

## 2020-08-27 MED ORDER — ONDANSETRON HCL 4 MG/2ML IJ SOLN
INTRAMUSCULAR | Status: DC | PRN
  Administered 2020-08-27: 4 mg via INTRAVENOUS

## 2020-08-27 SURGICAL SUPPLY — 24 items

## 2020-08-27 NOTE — Anesthesia Preprocedure Evaluation (Addendum)
Anesthesia Evaluation  Patient identified by MRN, date of birth, ID band Patient awake    Reviewed: Allergy & Precautions, NPO status , Patient's Chart, lab work & pertinent test results  History of Anesthesia Complications (+) PONV and history of anesthetic complications  Airway Mallampati: III  TM Distance: >3 FB Neck ROM: Full    Dental no notable dental hx.    Pulmonary sleep apnea and Continuous Positive Airway Pressure Ventilation ,    Pulmonary exam normal breath sounds clear to auscultation       Cardiovascular hypertension, Pt. on home beta blockers Normal cardiovascular exam Rhythm:Regular Rate:Normal  ECG: ST, rate 102   Neuro/Psych negative neurological ROS  negative psych ROS   GI/Hepatic Neg liver ROS, GERD  Medicated and Controlled,  Endo/Other  diabetes, Insulin Dependent, Oral Hypoglycemic AgentsMorbid obesityPCOS (polycystic ovarian syndrome)  Renal/GU negative Renal ROS     Musculoskeletal negative musculoskeletal ROS (+)   Abdominal (+) + obese,   Peds  Hematology HLD   Anesthesia Other Findings gerd; dysphagia; crc screening  Reproductive/Obstetrics                            Anesthesia Physical Anesthesia Plan  ASA: III  Anesthesia Plan: MAC   Post-op Pain Management:    Induction: Intravenous  PONV Risk Score and Plan: 3 and Propofol infusion and Treatment may vary due to age or medical condition  Airway Management Planned: Nasal Cannula  Additional Equipment:   Intra-op Plan:   Post-operative Plan:   Informed Consent: I have reviewed the patients History and Physical, chart, labs and discussed the procedure including the risks, benefits and alternatives for the proposed anesthesia with the patient or authorized representative who has indicated his/her understanding and acceptance.     Dental advisory given  Plan Discussed with: CRNA  Anesthesia  Plan Comments:        Anesthesia Quick Evaluation

## 2020-08-27 NOTE — Discharge Instructions (Signed)

## 2020-08-27 NOTE — Interval H&P Note (Signed)
History and Physical Interval Note:  08/27/2020 9:18 AM  [name]  has presented today for surgery, with the diagnosis of gerd; dysphagia; crc screening.  The various methods of treatment have been discussed with the patient and family. After consideration of risks, benefits and other options for treatment, the patient has consented to  Procedure(s): COLONOSCOPY WITH PROPOFOL (N/A) ESOPHAGOGASTRODUODENOSCOPY (EGD) WITH PROPOFOL (N/A) as a surgical intervention.  The patient's history has been reviewed, patient examined, no change in status, stable for surgery.  I have reviewed the patient's chart and labs.  Questions were answered to the patient's satisfaction.     Teresa Camacho

## 2020-08-27 NOTE — Transfer of Care (Signed)
Immediate Anesthesia Transfer of Care Note  Patient: Teresa Camacho  Procedure(s) Performed: COLONOSCOPY WITH PROPOFOL (N/A ) ESOPHAGOGASTRODUODENOSCOPY (EGD) WITH PROPOFOL (N/A ) POLYPECTOMY BALLOON DILATION (N/A )  Patient Location: PACU  Anesthesia Type:MAC  Level of Consciousness: sedated  Airway & Oxygen Therapy: Patient Spontanous Breathing and Patient connected to face mask oxygen  Post-op Assessment: Report given to RN and Post -op Vital signs reviewed and stable  Post vital signs: Reviewed and stable  Last Vitals:  Vitals Value Taken Time  BP    Temp    Pulse 100 08/27/20 1137  Resp 13 08/27/20 1137  SpO2 97 % 08/27/20 1137  Vitals shown include unvalidated device data.  Last Pain:  Vitals:   08/27/20 0958  TempSrc: Oral  PainSc: 0-No pain         Complications: No complications documented.

## 2020-08-27 NOTE — Op Note (Signed)
[facility] Patient Name: Teresa Camacho Procedure Date: 08/27/2020 MRN: [id number] Attending MD: Docia Chuck. Henrene Pastor , MD Date of Birth: 1965/11/01 CSN: [id number] Age: 55 Admit Type: Outpatient Procedure:                Upper GI endoscopy with balloon dilation of the                            esophagus, 18-20 mm. Indications:              Dysphagia, Esophageal reflux Providers:                Docia Chuck. Henrene Pastor, MD, Nelia Shi, RN, Lesia Sago, Technician Referring MD:             Karle Plumber, MD Medicines:                Monitored Anesthesia Care Complications:            No immediate complications. Estimated Blood Loss:     Estimated blood loss: none. Procedure:                Pre-Anesthesia Assessment:                           - Prior to the procedure, a History and Physical                            was performed, and patient medications and                            allergies were reviewed. The patient's tolerance of                            previous anesthesia was also reviewed. The risks                            and benefits of the procedure and the sedation                            options and risks were discussed with the patient.                            All questions were answered, and informed consent                            was obtained. Prior Anticoagulants: The patient has                            taken no previous anticoagulant or antiplatelet                            agents. After reviewing the risks and benefits, the  patient was deemed in satisfactory condition to                            undergo the procedure.                           After obtaining informed consent, the endoscope was                            passed under direct vision. Throughout the                            procedure, the patient's blood pressure, pulse, and                            oxygen  saturations were monitored continuously. The                            [id number]-[id number] ([id number]) Olympus gastroscope was                            introduced through the mouth, and advanced to the                            second part of duodenum. The upper GI endoscopy was                            accomplished without difficulty. The patient                            tolerated the procedure well. Scope In: Scope Out: Findings:      One benign-appearing, intrinsic moderate ringlike stenosis was found 39       cm from the incisors. This stenosis measured 1.5 cm (inner diameter).       After completing the endoscopic survey, a TTS dilator was passed through       the scope. Dilation with an 18-19-20 mm balloon dilator was performed to       20 mm. Minimal resistance. No post dilation mucosal trauma      The exam of the esophagus was otherwise normal.      The stomach was normal, save small hiatal hernia.      The examined duodenum was normal.      The cardia and gastric fundus were normal on retroflexion. Impression:               - Benign-appearing esophageal stenosis. Dilated.                           - Normal stomach.                           - Normal examined duodenum.                           - No specimens collected. Moderate Sedation:      none Recommendation:  1. Patient has a contact number available for                            emergencies. The signs and symptoms of potential                            delayed complications were discussed with the                            patient. Return to normal activities tomorrow.                            Written discharge instructions were provided to the                            patient.                           2. Post dilation diet.                           3. Continue present medications.                           4. Routine office follow-up with Dr. Henrene Pastor in 6                            weeks Procedure Code(s):         --- Professional ---                           [phone], Esophagogastroduodenoscopy, flexible,                            transoral; with transendoscopic balloon dilation of                            esophagus (less than 30 mm diameter) Diagnosis Code(s):        --- Professional ---                           K22.2, Esophageal obstruction                           R13.10, Dysphagia, unspecified                           K21.9, Gastro-esophageal reflux disease without                            esophagitis CPT copyright 2019 American Medical Association. All rights reserved. The codes documented in this report are preliminary and upon coder review may  be revised to meet current compliance requirements. Docia Chuck. Henrene Pastor, MD 08/27/2020 11:35:58 AM This report has been signed electronically. Number of Addenda: 0

## 2020-08-27 NOTE — Op Note (Signed)
[facility] Patient Name: Teresa Camacho Procedure Date: 08/27/2020 MRN: [id number] Attending MD: Docia Chuck. Henrene Pastor , MD Date of Birth: [birth year] CSN: [id number] Age: 55 Admit Type: Outpatient Procedure:                Colonoscopy with cold snare polypectomy x 1 Indications:              Screening for colorectal malignant neoplasm.                            Question mother had colon cancer but uncertain Providers:                Docia Chuck. Henrene Pastor, MD, Nelia Shi, RN, Lesia Sago, Technician Referring MD:             Karle Plumber, MD Medicines:                Monitored Anesthesia Care Complications:            No immediate complications. Estimated blood loss:                            None. Estimated Blood Loss:     Estimated blood loss: none. Procedure:                Pre-Anesthesia Assessment:                           - Prior to the procedure, a History and Physical                            was performed, and patient medications and                            allergies were reviewed. The patient's tolerance of                            previous anesthesia was also reviewed. The risks                            and benefits of the procedure and the sedation                            options and risks were discussed with the patient.                            All questions were answered, and informed consent                            was obtained. Prior Anticoagulants: The patient has                            taken no previous anticoagulant or antiplatelet  agents. After reviewing the risks and benefits, the                            patient was deemed in satisfactory condition to                            undergo the procedure.                           After obtaining informed consent, the colonoscope                            was passed under direct vision. Throughout the                             procedure, the patient's blood pressure, pulse, and                            oxygen saturations were monitored continuously. The                            [id number]-[id number] ([id number]) Olympus colonoscope was                            introduced through the anus and advanced to the the                            cecum, identified by appendiceal orifice and                            ileocecal valve. The terminal ileum, ileocecal                            valve, appendiceal orifice, and rectum were                            photographed. The quality of the bowel preparation                            was excellent. The colonoscopy was performed                            without difficulty. The patient tolerated the                            procedure well. The bowel preparation used was                            SUPREP via split dose instruction. Scope In: 10:36:48 AM Scope Out: 10:57:49 AM Scope Withdrawal Time: 0 hours 18 minutes 27 seconds  Total Procedure Duration: 0 hours 21 minutes 1 second  Findings:      The terminal ileum appeared normal.      A 3 mm polyp was found in the transverse colon. The polyp was removed  with a cold snare. Resection and retrieval were complete. Rare sigmoid       diverticulum      The exam was otherwise without abnormality on direct and retroflexion       views. Impression:               - The examined portion of the ileum was normal.                            Rare sigmoid diverticula.                           - One 3 mm polyp in the transverse colon, removed                            with a cold snare. Resected and retrieved.                           - The examination was otherwise normal on direct                            and retroflexion views. Moderate Sedation:      none Recommendation:           - Repeat colonoscopy in 5 years for surveillance                            (possible family history).                           - Patient  has a contact number available for                            emergencies. The signs and symptoms of potential                            delayed complications were discussed with the                            patient. Return to normal activities tomorrow.                            Written discharge instructions were provided to the                            patient.                           - Resume previous diet.                           - Continue present medications.                           - Await pathology results. Procedure Code(s):        --- Professional ---  45385, Colonoscopy, flexible; with removal of                            tumor(s), polyp(s), or other lesion(s) by snare                            technique Diagnosis Code(s):        --- Professional ---                           Z12.11, Encounter for screening for malignant                            neoplasm of colon                           K63.5, Polyp of colon CPT copyright 2019 American Medical Association. All rights reserved. The codes documented in this report are preliminary and upon coder review may  be revised to meet current compliance requirements. Docia Chuck. Henrene Pastor, MD 08/27/2020 11:03:29 AM This report has been signed electronically. Number of Addenda: 0

## 2020-08-27 NOTE — Anesthesia Postprocedure Evaluation (Signed)
Anesthesia Post Note  Patient: Teresa Camacho  Procedure(s) Performed: COLONOSCOPY WITH PROPOFOL (N/A ) ESOPHAGOGASTRODUODENOSCOPY (EGD) WITH PROPOFOL (N/A ) POLYPECTOMY BALLOON DILATION (N/A )     Patient location during evaluation: Endoscopy Anesthesia Type: MAC Level of consciousness: awake Pain management: pain level controlled Vital Signs Assessment: post-procedure vital signs reviewed and stable Respiratory status: spontaneous breathing, nonlabored ventilation, respiratory function stable and patient connected to nasal cannula oxygen Cardiovascular status: stable and blood pressure returned to baseline Postop Assessment: no apparent nausea or vomiting Anesthetic complications: no   No complications documented.  Last Vitals:  Vitals:   08/27/20 1139 08/27/20 1150  BP: (!) 96/51 (!) 111/52  Pulse: 100 93  Resp: 19 20  Temp:    SpO2: 97% 97%    Last Pain:  Vitals:   08/27/20 1150  TempSrc:   PainSc: 0-No pain                 Sharlena Kristensen P Malayna Noori

## 2020-08-27 NOTE — Anesthesia Procedure Notes (Signed)
Procedure Name: MAC Date/Time: 08/27/2020 10:21 AM Performed by: Maxwell Caul, CRNA Pre-anesthesia Checklist: Patient identified, Emergency Drugs available, Suction available and Patient being monitored Oxygen Delivery Method: Simple face mask

## 2020-08-28 ENCOUNTER — Encounter (HOSPITAL_COMMUNITY): Payer: Self-pay | Admitting: Internal Medicine

## 2020-08-28 LAB — SURGICAL PATHOLOGY

## 2020-09-16 ENCOUNTER — Other Ambulatory Visit: Payer: Self-pay

## 2020-09-18 ENCOUNTER — Telehealth: Payer: Self-pay | Admitting: Endocrinology

## 2020-09-18 ENCOUNTER — Other Ambulatory Visit: Payer: Self-pay

## 2020-09-18 DIAGNOSIS — E1142 Type 2 diabetes mellitus with diabetic polyneuropathy: Secondary | ICD-10-CM

## 2020-09-18 MED ORDER — LANTUS SOLOSTAR 100 UNIT/ML ~~LOC~~ SOPN
120.0000 [IU] | PEN_INJECTOR | SUBCUTANEOUS | 3 refills | Status: DC
  Filled 2020-09-18: qty 24, 20d supply, fill #0
  Filled 2020-09-19: qty 36, 30d supply, fill #0
  Filled 2020-10-16: qty 36, 30d supply, fill #1
  Filled 2020-11-20: qty 27, 22d supply, fill #2
  Filled 2020-12-12: qty 36, 30d supply, fill #3
  Filled 2021-01-06: qty 36, 30d supply, fill #4

## 2020-09-18 NOTE — Addendum Note (Signed)
Addended by: Renato Shin on: 09/18/2020 06:40 PM   Modules accepted: Orders

## 2020-09-18 NOTE — Telephone Encounter (Signed)
Patient called re: Patient requests a new Rx with the corrected dosage of Lantus Solostar Pen - 120 units (PHARM does not RX with the corrected dosage for the above listed medication) be sent to:  [facility] [place] Phone:  [phone]  Fax:  [phone]

## 2020-09-18 NOTE — Telephone Encounter (Signed)
Message sent thru MyChart 

## 2020-09-18 NOTE — Telephone Encounter (Signed)
OK, I have sent a prescription to your pharmacy, to refill 

## 2020-09-19 ENCOUNTER — Other Ambulatory Visit: Payer: Self-pay

## 2020-09-20 ENCOUNTER — Other Ambulatory Visit: Payer: Self-pay

## 2020-09-20 MED FILL — Gabapentin Cap 300 MG: ORAL | 30 days supply | Qty: 30 | Fill #1 | Status: AC

## 2020-09-30 ENCOUNTER — Ambulatory Visit: Payer: Medicaid Other | Admitting: Internal Medicine

## 2020-10-16 ENCOUNTER — Other Ambulatory Visit: Payer: Self-pay

## 2020-10-16 MED FILL — Lancets: 25 days supply | Qty: 100 | Fill #1 | Status: AC

## 2020-10-16 MED FILL — Glucose Blood Test Strip: 25 days supply | Qty: 100 | Fill #1 | Status: AC

## 2020-10-18 ENCOUNTER — Other Ambulatory Visit: Payer: Self-pay

## 2020-10-18 MED FILL — Gabapentin Cap 300 MG: ORAL | 30 days supply | Qty: 30 | Fill #2 | Status: AC

## 2020-10-26 ENCOUNTER — Other Ambulatory Visit: Payer: Self-pay | Admitting: Internal Medicine

## 2020-10-26 DIAGNOSIS — I10 Essential (primary) hypertension: Secondary | ICD-10-CM

## 2020-10-26 DIAGNOSIS — E1169 Type 2 diabetes mellitus with other specified complication: Secondary | ICD-10-CM

## 2020-10-26 DIAGNOSIS — E785 Hyperlipidemia, unspecified: Secondary | ICD-10-CM

## 2020-11-05 ENCOUNTER — Other Ambulatory Visit: Payer: Self-pay

## 2020-11-05 ENCOUNTER — Ambulatory Visit (INDEPENDENT_AMBULATORY_CARE_PROVIDER_SITE_OTHER): Payer: Medicaid Other | Admitting: Endocrinology

## 2020-11-05 VITALS — BP 118/68 | HR 90 | Ht 61.0 in | Wt 231.0 lb

## 2020-11-05 DIAGNOSIS — Z794 Long term (current) use of insulin: Secondary | ICD-10-CM

## 2020-11-05 DIAGNOSIS — E1142 Type 2 diabetes mellitus with diabetic polyneuropathy: Secondary | ICD-10-CM

## 2020-11-05 DIAGNOSIS — E1165 Type 2 diabetes mellitus with hyperglycemia: Secondary | ICD-10-CM

## 2020-11-05 LAB — POCT GLYCOSYLATED HEMOGLOBIN (HGB A1C): Hemoglobin A1C: 8.8 % — AB (ref 4.0–5.6)

## 2020-11-05 MED ORDER — DAPAGLIFLOZIN PROPANEDIOL 10 MG PO TABS: 10.0000 mg | ORAL_TABLET | Freq: Every day | ORAL | 3 refills | Status: DC

## 2020-11-05 MED ORDER — DAPAGLIFLOZIN PROPANEDIOL 10 MG PO TABS
10.0000 mg | ORAL_TABLET | Freq: Every day | ORAL | 3 refills | Status: DC
  Filled 2020-11-05: qty 30, 30d supply, fill #0
  Filled 2020-11-29 (×2): qty 30, 30d supply, fill #1
  Filled 2021-01-06: qty 30, 30d supply, fill #2

## 2020-11-05 NOTE — Patient Instructions (Addendum)
check your blood sugar twice a day.  vary the time of day when you check, between before the 3 meals, and at bedtime.  also check if you have symptoms of your blood sugar being too high or too low.  please keep a record of the readings and bring it to your next appointment here (or you can bring the meter itself).  You can write it on any piece of paper.  please call us sooner if your blood sugar goes below 70, or if most of your readings are over 200.    I have sent a prescription to your pharmacy, to add "Wilder Glade."   Please continue the same other diabetes medications.   Please come back for a follow-up appointment in 2 months.

## 2020-11-05 NOTE — Progress Notes (Signed)
Subjective:    Patient ID: Teresa Camacho, female    DOB: 03/04/1966, 55 y.o.   MRN: [id number]  HPI Pt returns for f/u of diabetes mellitus:  DM type: Insulin-requiring type 2 Dx'ed: 9150 Complications: none Therapy: insulin since soon after dx, Ozempic, and metformin.  GDM: never DKA: never Severe hypoglycemia: never Pancreatitis: never Pancreatic imaging: normal on 2015 CT SDOH: she has "Orange card." Other: she takes qd insulin, at least for now; Ozempic dosage is limited by GI sxs. Interval history: she brings a record of her cbg's today.  cbg varies from 185-304.  She takes meds as rx'ed.   Past Medical History:  Diagnosis Date   Anemia    Blood transfusion without reported diagnosis    Fibroids, submucosal 05/03/2013   GERD (gastroesophageal reflux disease)    Hyperlipidemia    Hypertension    PCOS (polycystic ovarian syndrome)    PONV (postoperative nausea and vomiting)    " super tight airway" as per pt   Sleep apnea    Type II diabetes mellitus ([place])     Past Surgical History:  Procedure Laterality Date   ABDOMINAL HYSTERECTOMY N/A 06/13/2013   Procedure: HYSTERECTOMY ABDOMINAL and removal of vaginal skin tag;  Surgeon: Luz Lex, MD;  Location: [facility];  Service: Gynecology;  Laterality: N/A;   APPENDECTOMY     BALLOON DILATION N/A 08/27/2020   Procedure: BALLOON DILATION;  Surgeon: Irene Shipper, MD;  Location: [facility];  Service: Endoscopy;  Laterality: N/A;   CESAREAN SECTION     3x   CHOLECYSTECTOMY     CHOLECYSTECTOMY  1991   COLONOSCOPY WITH PROPOFOL N/A 08/27/2020   Procedure: COLONOSCOPY WITH PROPOFOL;  Surgeon: Irene Shipper, MD;  Location: [facility];  Service: Endoscopy;  Laterality: N/A;   ESOPHAGOGASTRODUODENOSCOPY (EGD) WITH PROPOFOL N/A 08/27/2020   Procedure: ESOPHAGOGASTRODUODENOSCOPY (EGD) WITH PROPOFOL;  Surgeon: Irene Shipper, MD;  Location: [facility];  Service: Endoscopy;  Laterality: N/A;   POLYPECTOMY  08/27/2020   Procedure:  POLYPECTOMY;  Surgeon: Irene Shipper, MD;  Location: [facility];  Service: Endoscopy;;    Social History   Socioeconomic History   Marital status: Divorced    Spouse name: Not on file   Number of children: 5   Years of education: Not on file   Highest education level: Not on file  Occupational History   Not on file  Tobacco Use   Smoking status: Never   Smokeless tobacco: Never  Vaping Use   Vaping Use: Never used  Substance and Sexual Activity   Alcohol use: No   Drug use: No   Sexual activity: Never  Other Topics Concern   Not on file  Social History Narrative   Not on file   Social Determinants of Health   Financial Resource Strain: Not on file  Food Insecurity: Not on file  Transportation Needs: Not on file  Physical Activity: Not on file  Stress: Not on file  Social Connections: Not on file  Intimate Partner Violence: Not on file    Current Outpatient Medications on File Prior to Visit  Medication Sig Dispense Refill   fenofibrate (TRICOR) 48 MG tablet TAKE ONE TABLET BY MOUTH DAILY 30 tablet 2   acetaminophen (TYLENOL) 500 MG tablet Take 1,000 mg by mouth every 6 (six) hours as needed for mild pain.     aspirin EC 81 MG tablet Take 1 tablet (81 mg total) by mouth daily. Swallow whole. (Patient taking differently:  Take 81 mg by mouth in the morning. Swallow whole.) 100 tablet 2   atorvastatin (LIPITOR) 80 MG tablet TAKE ONE TABLET BY MOUTH DAILY 30 tablet 0   blood glucose meter kit and supplies KIT Dispense based on patient and insurance preference. Use up to four times daily as directed. (FOR ICD-9 250.00, 250.01). 1 each 0   Blood Glucose Monitoring Suppl (TRUE METRIX METER) w/Device KIT Use as directed 1 kit 0   Cholecalciferol (VITAMIN D-3) 125 MCG (5000 UT) TABS Take 5,000 Units by mouth in the morning.     desloratadine (CLARINEX) 5 MG tablet Take 5 mg by mouth at bedtime.     diphenhydrAMINE (BENADRYL) 25 mg capsule Take 50 mg by mouth at bedtime.      gabapentin (NEURONTIN) 300 MG capsule TAKE 1 CAPSULE (300 MG TOTAL) BY MOUTH AT BEDTIME. (Patient taking differently: Take 600 mg by mouth at bedtime.) 90 capsule 3   glucose blood test strip USE AS INSTRUCTED 100 strip 12   insulin glargine (LANTUS SOLOSTAR) 100 UNIT/ML Solostar Pen Inject 120 Units into the skin every morning. 120 mL 3   Insulin Pen Needle 32G X 4 MM MISC 1 each by Does not apply route in the morning and at bedtime. 100 each 1   lisinopril (ZESTRIL) 10 MG tablet TAKE ONE TABLET BY MOUTH DAILY 30 tablet 0   loratadine (CLARITIN) 10 MG tablet Take 1 tablet (10 mg total) by mouth daily. (Patient not taking: Reported on 08/16/2020) 30 tablet 6   Melatonin 10 MG SUBL Place 20 mg under the tongue at bedtime.     metFORMIN (GLUCOPHAGE-XR) 500 MG 24 hr tablet Take 4 tablets (2,000 mg total) by mouth daily. 360 tablet 3   metoprolol tartrate (LOPRESSOR) 25 MG tablet TAKE ONE TABLET BY MOUTH TWICE A DAY 60 tablet 0   Multiple Vitamins-Minerals (MULTIVITAMIN WITH MINERALS) tablet Take 1 tablet by mouth daily.     Na Sulfate-K Sulfate-Mg Sulf 17.5-3.13-1.6 GM/177ML SOLN TAKE AS 1 TIME DOSE 354 mL 0   pantoprazole (PROTONIX) 40 MG tablet Take 1 tablet (40 mg total) by mouth 2 (two) times daily before a meal. 60 tablet 3   Semaglutide,0.25 or 0.5MG/DOS, 2 MG/1.5ML SOPN Inject 0.25 mg into the skin once a week. 1.5 mL 3   traZODone (DESYREL) 100 MG tablet Take 100 mg by mouth at bedtime.     TRUEplus Lancets 28G MISC USE AS DIRECTED 100 each 4   No current facility-administered medications on file prior to visit.    Allergies  Allergen Reactions   Wound Dressing Adhesive Other (See Comments)    Redness/irritation   Codeine Nausea And Vomiting    Pt is able to take percocet & vicodin   Latex Rash    irritation    Family History  Problem Relation Age of Onset   Depression Mother    Diabetes Mother    Arthritis Mother    Heart disease Mother    Learning disabilities Mother     Mental illness Mother    Liver cancer Mother        metastatic from colon??   Arthritis Father    Prostate cancer Father        met to esophagus   Colon polyps Sister    Cancer Maternal Aunt        x 2 Aunts    BP 118/68   Pulse 90   Ht 5' 1"  (1.549 m)   Wt 231 lb (104.8  kg)   LMP 04/17/2013   SpO2 96%   BMI 43.65 kg/m    Review of Systems     Objective:   Physical Exam Pulses: dorsalis pedis intact bilat.   MSK: no deformity of the feet CV: trace bilat leg edema Skin:  no ulcer on the feet.  normal color and temp on the feet. Neuro: sensation is intact to touch on the feet.    Lab Results  Component Value Date   CREATININE 0.49 12/02/2019   BUN 10 12/02/2019   NA 137 12/02/2019   K 3.8 12/02/2019   CL 103 12/02/2019   CO2 21 (L) 12/02/2019   Lab Results  Component Value Date   TSH 2.329 11/30/2019     Lab Results  Component Value Date   HGBA1C 8.8 (A) 11/05/2020       Assessment & Plan:  Insulin-requiring type 2 DM: uncontrolled.  Patient Instructions  check your blood sugar twice a day.  vary the time of day when you check, between before the 3 meals, and at bedtime.  also check if you have symptoms of your blood sugar being too high or too low.  please keep a record of the readings and bring it to your next appointment here (or you can bring the meter itself).  You can write it on any piece of paper.  please call us sooner if your blood sugar goes below 70, or if most of your readings are over 200.    I have sent a prescription to your pharmacy, to add "Wilder Glade."   Please continue the same other diabetes medications.   Please come back for a follow-up appointment in 2 months.

## 2020-11-11 ENCOUNTER — Encounter: Payer: Self-pay | Admitting: Internal Medicine

## 2020-11-11 ENCOUNTER — Ambulatory Visit: Payer: Self-pay | Attending: Internal Medicine | Admitting: Internal Medicine

## 2020-11-11 ENCOUNTER — Other Ambulatory Visit: Payer: Self-pay

## 2020-11-11 DIAGNOSIS — E785 Hyperlipidemia, unspecified: Secondary | ICD-10-CM

## 2020-11-11 DIAGNOSIS — E1142 Type 2 diabetes mellitus with diabetic polyneuropathy: Secondary | ICD-10-CM

## 2020-11-11 DIAGNOSIS — E1169 Type 2 diabetes mellitus with other specified complication: Secondary | ICD-10-CM

## 2020-11-11 DIAGNOSIS — D2230 Melanocytic nevi of unspecified part of face: Secondary | ICD-10-CM

## 2020-11-11 DIAGNOSIS — I10 Essential (primary) hypertension: Secondary | ICD-10-CM

## 2020-11-11 NOTE — Progress Notes (Signed)
Patient ID: Teresa Camacho, female   DOB: 04-12-1966, 55 y.o.   MRN: [id number] Virtual Visit via Telephone Note  I connected with [name] on 11/11/2020 at 10:32 a.m by telephone and verified that I am speaking with the correct person using two identifiers  Location: Patient: in her car on the side of the road Provider: office  Participants: Myself Patient   I discussed the limitations, risks, security and privacy concerns of performing an evaluation and management service by telephone and the availability of in person appointments. I also discussed with the patient that there may be a patient responsible charge related to this service. The patient expressed understanding and agreed to proceed.   History of Present Illness: Patient with history of DM type II with microalbumin, HTN, HL, OSA on CPAP, GERD,PCOS, pattern baldness. Last seen 05/2020.  This is for chronic ds management.  DM: saw Dr. Loanne Drilling a few times since last visit.  Last visit was last wk. Farxiga added, Lantus increased to 120 units, Metformin same and Ozempic dose decreased.  F/u in 2 mths planned Last A1C 8.8 BS better but not where they need to be, checking BID.  Before BF range 190s; bedtime 200-215.  States she was told by Dr. Loanne Drilling to give the [place] a wk or so and if a.m BS still elev, call him to adjust insulin further.  She plans to do that this wk -over due for eye exam.  No insurance at this time and she is not sure whether she would be able to afford.    Has several spots on her face that she would like to have evaluated by the dermatologist.  Some have been present for yrs but changed in size and color.    HTN:  no home device to check BP.  BP when she saw endocrine last wk was good at 118/68.  Reports compliance with taking lisinopril and metoprolol.  HL: Reports compliance with atorvastatin and Tricor.  She wanted me to know that she had an appointment to see me in May and she arrived early for the  appointment.  However she states the line at the front desk was long and by the time she got up to the window it was 5 minutes past her appointment and was told that she had to reschedule as she was late. Outpatient Encounter Medications as of 11/11/2020  Medication Sig Note   fenofibrate (TRICOR) 48 MG tablet TAKE ONE TABLET BY MOUTH DAILY    acetaminophen (TYLENOL) 500 MG tablet Take 1,000 mg by mouth every 6 (six) hours as needed for mild pain.    aspirin EC 81 MG tablet Take 1 tablet (81 mg total) by mouth daily. Swallow whole. (Patient taking differently: Take 81 mg by mouth in the morning. Swallow whole.)    atorvastatin (LIPITOR) 80 MG tablet TAKE ONE TABLET BY MOUTH DAILY    blood glucose meter kit and supplies KIT Dispense based on patient and insurance preference. Use up to four times daily as directed. (FOR ICD-9 250.00, 250.01).    Blood Glucose Monitoring Suppl (TRUE METRIX METER) w/Device KIT Use as directed    Cholecalciferol (VITAMIN D-3) 125 MCG (5000 UT) TABS Take 5,000 Units by mouth in the morning.    dapagliflozin propanediol (FARXIGA) 10 MG TABS tablet Take 1 tablet (10 mg total) by mouth daily before breakfast.    desloratadine (CLARINEX) 5 MG tablet Take 5 mg by mouth at bedtime.    diphenhydrAMINE (BENADRYL) 25  mg capsule Take 50 mg by mouth at bedtime.    gabapentin (NEURONTIN) 300 MG capsule TAKE 1 CAPSULE (300 MG TOTAL) BY MOUTH AT BEDTIME. (Patient taking differently: Take 600 mg by mouth at bedtime.)    glucose blood test strip USE AS INSTRUCTED    insulin glargine (LANTUS SOLOSTAR) 100 UNIT/ML Solostar Pen Inject 120 Units into the skin every morning.    Insulin Pen Needle 32G X 4 MM MISC 1 each by Does not apply route in the morning and at bedtime.    lisinopril (ZESTRIL) 10 MG tablet TAKE ONE TABLET BY MOUTH DAILY    loratadine (CLARITIN) 10 MG tablet Take 1 tablet (10 mg total) by mouth daily. (Patient not taking: Reported on 08/16/2020)    Melatonin 10 MG SUBL Place  20 mg under the tongue at bedtime.    metFORMIN (GLUCOPHAGE-XR) 500 MG 24 hr tablet Take 4 tablets (2,000 mg total) by mouth daily.    metoprolol tartrate (LOPRESSOR) 25 MG tablet TAKE ONE TABLET BY MOUTH TWICE A DAY    Multiple Vitamins-Minerals (MULTIVITAMIN WITH MINERALS) tablet Take 1 tablet by mouth daily. 08/16/2020: On hold until patient purchases more   Na Sulfate-K Sulfate-Mg Sulf 17.5-3.13-1.6 GM/177ML SOLN TAKE AS 1 TIME DOSE    pantoprazole (PROTONIX) 40 MG tablet Take 1 tablet (40 mg total) by mouth 2 (two) times daily before a meal.    Semaglutide,0.25 or 0.5MG/DOS, 2 MG/1.5ML SOPN Inject 0.25 mg into the skin once a week.    traZODone (DESYREL) 100 MG tablet Take 100 mg by mouth at bedtime.    TRUEplus Lancets 28G MISC USE AS DIRECTED    No facility-administered encounter medications on file as of 11/11/2020.      Observations/Objective: Results for orders placed or performed in visit on 11/05/20  POCT glycosylated hemoglobin (Hb A1C)  Result Value Ref Range   Hemoglobin A1C 8.8 (A) 4.0 - 5.6 %   HbA1c POC (<> result, manual entry)     HbA1c, POC (prediabetic range)     HbA1c, POC (controlled diabetic range)       Assessment and Plan: 1. Type 2 diabetes mellitus with peripheral neuropathy (HCC) Blood sugars improving but not at goal. Wilder Glade recently added by the endocrinologist.  She plans to call him later this week to get further instructions on adjusting Lantus insulin.  She will continue her other medications including Ozempic and metformin.  Continue trying to eat healthy.  2. Essential hypertension At goal.  Continue current medications and low-salt diet.  3. Hyperlipidemia associated with type 2 diabetes mellitus ([place]) Last LDL was 22 which is at goal.  Continue atorvastatin and Tricor.  4. Change in facial mole - Ambulatory referral to Dermatology  I apologized to the patient for her experience and May.  This should not have happened.    I will speak with  the charge person for the front test.  Follow Up Instructions: 4 mths   I discussed the assessment and treatment plan with the patient. The patient was provided an opportunity to ask questions and all were answered. The patient agreed with the plan and demonstrated an understanding of the instructions.   The patient was advised to call back or seek an in-person evaluation if the symptoms worsen or if the condition fails to improve as anticipated.  I  Spent 12 minutes on this telephone encounter  Karle Plumber, MD

## 2020-11-17 ENCOUNTER — Other Ambulatory Visit: Payer: Self-pay | Admitting: Internal Medicine

## 2020-11-17 DIAGNOSIS — E785 Hyperlipidemia, unspecified: Secondary | ICD-10-CM

## 2020-11-17 NOTE — Telephone Encounter (Signed)
Requested medication (s) are due for refill today: yes  Requested medication (s) are on the active medication list: yes  Last refill:  08/27/20  Future visit scheduled: yes  Notes to clinic:  overdue lab work   Requested Prescriptions  Pending Prescriptions Disp Refills   fenofibrate (TRICOR) 48 MG tablet [Pharmacy Med Name: FENOFIBRATE 48 MG TABLET] 25 tablet     Sig: TAKE ONE TABLET BY MOUTH DAILY      Cardiovascular:  Antilipid - Fibric Acid Derivatives Failed - 11/17/2020 11:14 AM      Failed - Total Cholesterol in normal range and within 360 days    Cholesterol, Total  Date Value Ref Range Status  04/05/2020 82 (L) 100 - 199 mg/dL Final          Failed - HDL in normal range and within 360 days    HDL  Date Value Ref Range Status  04/05/2020 33 (L) >39 mg/dL Final          Failed - Triglycerides in normal range and within 360 days    Triglycerides  Date Value Ref Range Status  04/05/2020 163 (H) 0 - 149 mg/dL Final          Failed - ALT in normal range and within 180 days    ALT  Date Value Ref Range Status  06/14/2013 11 0 - 35 U/L Final          Failed - AST in normal range and within 180 days    AST  Date Value Ref Range Status  06/14/2013 11 0 - 37 U/L Final          Failed - Cr in normal range and within 180 days    Creatinine, Ser  Date Value Ref Range Status  12/02/2019 0.49 0.44 - 1.00 mg/dL Final          Failed - eGFR in normal range and within 180 days    GFR calc Af Amer  Date Value Ref Range Status  12/02/2019 >60 >60 mL/min Final   GFR calc non Af Amer  Date Value Ref Range Status  12/02/2019 >60 >60 mL/min Final          Passed - LDL in normal range and within 360 days    LDL Chol Calc (NIH)  Date Value Ref Range Status  04/05/2020 22 0 - 99 mg/dL Final   Direct LDL  Date Value Ref Range Status  12/02/2019 95.5 0 - 99 mg/dL Final    Comment:    Performed at [facility], [address], [place], [place] [address]           Passed - Valid encounter within last 12 months    Recent Outpatient Visits           6 days ago Type 2 diabetes mellitus with peripheral neuropathy [facility])   [place], Neoma Laming B, MD   5 months ago Type 2 diabetes mellitus with peripheral neuropathy [facility])   [place] Karle Plumber B, MD   6 months ago Type 2 diabetes mellitus with hyperglycemia, with long-term current use of insulin [facility])   [place], Annie Main L, RPH-CPP   7 months ago Type 2 diabetes mellitus with hyperglycemia, with long-term current use of insulin [facility])   [place], Annie Main L, RPH-CPP   8 months ago Type 2 diabetes  mellitus with hyperglycemia, with long-term current use of insulin [facility])   [place], RPH-CPP       Future Appointments             In 3 months Wynetta Emery, Dalbert Batman, MD [place]

## 2020-11-19 ENCOUNTER — Other Ambulatory Visit: Payer: Self-pay

## 2020-11-20 ENCOUNTER — Other Ambulatory Visit: Payer: Self-pay

## 2020-11-20 MED FILL — Gabapentin Cap 300 MG: ORAL | 30 days supply | Qty: 30 | Fill #3 | Status: AC

## 2020-11-21 ENCOUNTER — Other Ambulatory Visit: Payer: Self-pay | Admitting: Internal Medicine

## 2020-11-21 DIAGNOSIS — E1169 Type 2 diabetes mellitus with other specified complication: Secondary | ICD-10-CM

## 2020-11-21 DIAGNOSIS — I10 Essential (primary) hypertension: Secondary | ICD-10-CM

## 2020-11-21 DIAGNOSIS — E785 Hyperlipidemia, unspecified: Secondary | ICD-10-CM

## 2020-11-22 ENCOUNTER — Other Ambulatory Visit: Payer: Self-pay

## 2020-11-29 ENCOUNTER — Other Ambulatory Visit: Payer: Self-pay

## 2020-12-02 ENCOUNTER — Other Ambulatory Visit: Payer: Self-pay

## 2020-12-06 ENCOUNTER — Other Ambulatory Visit: Payer: Self-pay

## 2020-12-06 MED FILL — Lancets: 25 days supply | Qty: 100 | Fill #2 | Status: AC

## 2020-12-12 ENCOUNTER — Other Ambulatory Visit: Payer: Self-pay

## 2020-12-16 ENCOUNTER — Other Ambulatory Visit: Payer: Self-pay

## 2020-12-17 ENCOUNTER — Other Ambulatory Visit: Payer: Self-pay | Admitting: Internal Medicine

## 2020-12-17 DIAGNOSIS — K219 Gastro-esophageal reflux disease without esophagitis: Secondary | ICD-10-CM

## 2021-01-02 ENCOUNTER — Other Ambulatory Visit: Payer: Self-pay

## 2021-01-02 ENCOUNTER — Ambulatory Visit (INDEPENDENT_AMBULATORY_CARE_PROVIDER_SITE_OTHER): Payer: Self-pay | Admitting: Family Medicine

## 2021-01-02 VITALS — BP 114/60 | HR 98 | Wt 229.6 lb

## 2021-01-02 DIAGNOSIS — L820 Inflamed seborrheic keratosis: Secondary | ICD-10-CM

## 2021-01-02 DIAGNOSIS — L989 Disorder of the skin and subcutaneous tissue, unspecified: Secondary | ICD-10-CM

## 2021-01-02 DIAGNOSIS — L570 Actinic keratosis: Secondary | ICD-10-CM

## 2021-01-02 NOTE — Progress Notes (Signed)
    SUBJECTIVE:   CHIEF COMPLAINT / HPI:   Patient is concerned about several spots on her scalp and back.  Her brother was recently diagnosed with skin cancer and had come to visit her, and remarked that the spots on her head similar to ones he had before he was diagnosed and she is now concerned about cancer. She also notes that there is a spot on her back that gets very irritated by her bra  PERTINENT  PMH / PSH: Reviewed  OBJECTIVE:   BP 114/60   Pulse 98   Wt 229 lb 9.6 oz (104.1 kg)   LMP 04/17/2013   SpO2 95%   BMI 43.38 kg/m   General: NAD, well-appearing, well-nourished Respiratory: No respiratory distress, breathing comfortably, able to speak in full sentences Skin: warm and dry, no rashes noted on exposed skin. Rough raised macule on anterior left scalp and temple, raised rough seborrheic keratoses on the back. Other lesions as noted on the ear and left shoulder pictured below Psych: Appropriate affect and mood   Left shoulder   SK on back    AK left scalp   Left temporal site of biopsy  Left ear  Punch biopsy of left temporal region After informed written consent was obtained, using Betadine for cleansing and 1% Lidocaine with epinephrine for anesthetic, with sterile technique a 3 mm punch biopsy was used to obtain a biopsy specimen of the lesion. Hemostasis was obtained by pressure.  Antibiotic dressing is applied, and wound care instructions provided. Be alert for any signs of cutaneous infection. The specimen is labeled and sent to pathology for evaluation. The procedure was well tolerated without complications.  Cryotherapy procedure Freeze-and-thaw x2 on left anterior scalp, procedure well tolerated  Shave biopsy After consent obtained, Betadine used for cleansing.  1% lidocaine with epinephrine was used for anesthetic.  Shave biopsy with derma blade performed and procedure was well-tolerated.  ASSESSMENT/PLAN:   Concerning lesion on left temple side Punch  biopsy completed today.  Lesion-several areas of coloration, punch biopsy was performed at the most melanotic site.  May be an atypical AK but need to rule out cancer.  We will update patient biopsy results  Seborrheic keratoses Irritated seborrheic keratoses on right sided back at the level of the bra strap..  Irritated and was removed via shave procedure.  Actinic keratoses of anterior left scalp Cryotherapy was used on AK and was well-tolerated.  Other skin lesions Erythematous and dry flaky skin lesion noted on left ear as pictured above that we will need to be monitored further.  Patient counseled on wearing appropriate head care. Left shoulder as pictured above with a red lesion that does not appear inflamed, also need to be monitored, could be a hypertrophic scar of some sort versus a concern for a precancerous lesion     Rashon Rezek, [place]

## 2021-01-02 NOTE — Patient Instructions (Addendum)
Today we removed a seborrheic keratosis that was irritated from your back. We also did a biopsy of the spot on your scalp. We will call you with the results of the biopsies if there is anything concerning. You can always make another appointment if any other spots are irritating you.    Actinic Keratosis An actinic keratosis is a precancerous growth on the skin. If there is more than one growth, the condition is called actinic keratoses. Actinic keratoses appear most often on areas of skin that get a lot of sun exposure, including the scalp, face, ears, lips, upper back, forearms, and thebacks of the hands. If left untreated, these growths may develop into a skin cancer called squamous cell carcinoma. It is important to have all these growths checked by a healthcare provider to determine the best treatment approach. What are the causes? Actinic keratoses are caused by getting too much ultraviolet (UV) radiationfrom the sun or other UV light sources. What increases the risk? You are more likely to develop this condition if you: Have light-colored skin and blue eyes. Have blond or red hair. Spend a lot of time in the sun. Do not protect your skin from the sun when outdoors. Are an older person. The risk of developing an actinic keratosis increases with age. What are the signs or symptoms? Actinic keratoses feel like scaly, rough spots of skin. Symptoms of this condition include growths that may: Be as small as a pinhead or as big as a quarter. Itch, hurt, or feel sensitive. Be skin-colored, light tan, dark tan, pink, or a combination of any of these colors. In most cases, the growths become red. Have a small piece of pink or gray skin (skin tag) growing from them. It may be easier to notice actinic keratoses by feeling them, rather than seeing them. Sometimes, actinic keratoses disappear, but many reappear a fewdays to a few weeks later. How is this diagnosed? This condition is usually diagnosed  with a physical exam. A tissue sample may be removed from the actinic keratosis and examined under a microscope (biopsy). How is this treated? If needed, this condition may be treated by: Scraping off the actinic keratosis (curettage). Freezing the actinic keratosis with liquid nitrogen (cryosurgery). This causes the growth to eventually fall off the skin. Applying medicated creams or gels to destroy the cells in the growth. Applying chemicals to the actinic keratosis to make the outer layers of skin peel off (chemical peel). Using photodynamic therapy. In this procedure, medicated cream is applied to the actinic keratosis. This cream increases your skin's sensitivity to light. Then, a strong light is aimed at the actinic keratosis to destroy cells in the growth. Follow these instructions at home: Skin care Apply cool, wet cloths (cool compresses) to the affected areas. Do not scratch your skin. Check your skin regularly for any growths, especially growths that: Start to itch or bleed. Change in size, shape, or color. Caring for the treated area Keep the treated area clean and dry as told by your health care provider. Do not apply any medicine, cream, or lotion to the treated area unless your health care provider tells you to do that. Do not pick at blisters or try to break them open. This can cause infection and scarring. If you have red or irritated skin after treatment, follow instructions from your health care provider about how to take care of the treated area. Make sure you: Wash your hands with soap and water before you change your  bandage (dressing). If soap and water are not available, use hand sanitizer. Change your dressing as told by your health care provider. If you have red or irritated skin after treatment, check your treated area every day for signs of infection. Check for: Redness, swelling, or pain. Fluid or blood. Warmth. Pus or a bad smell. General instructions Take  or apply over-the-counter and prescription medicines only as told by your health care provider. Return to your normal activities as told by your health care provider. Ask your health care provider what activities are safe for you. Have a skin exam done every year by a health care provider who is a skin specialist (dermatologist). Keep all follow-up visits as told by your health care provider. This is important. Lifestyle Do not use any products that contain nicotine or tobacco, such as cigarettes and e-cigarettes. If you need help quitting, ask your health care provider. Take steps to protect your skin from the sun. Try to avoid the sun between 10:00 a.m. and 4:00 p.m. This is when the UV light is the strongest. Use a sunscreen or sunblock with SPF 30 (sun protection factor 30) or greater. Apply sunscreen before you are exposed to sunlight and reapply as often as directed by the instructions on the sunscreen container. Always wear sunglasses that have UV protection, and always wear a hat and clothing to protect your skin from sunlight. When possible, avoid medicines that increase your sensitivity to sunlight. Do not use tanning beds or other indoor tanning devices. Contact a health care provider if: You notice any changes or new growths on your skin. You have swelling, pain, or more redness around your treated area. You have fluid or blood coming from your treated area. Your treated area feels warm to the touch. You have pus or a bad smell coming from your treated area. You have a fever. You have a blister that becomes large and painful. Summary An actinic keratosis is a precancerous growth on the skin. If there is more than one growth, the condition is called actinic keratoses. In some cases, if left untreated, these growths can develop into skin cancer. Check your skin regularly for any growths, especially growths that start to itch or bleed, or change in size, shape, or color. Take steps  to protect your skin from the sun. Contact a health care provider if you notice any changes or new growths on your skin. Keep all follow-up visits as told by your health care provider. This is important. This information is not intended to replace advice given to you by your health care provider. Make sure you discuss any questions you have with your healthcare provider. Document Revised: 09/14/2017 Document Reviewed: 09/14/2017 Elsevier Patient Education  [place].

## 2021-01-06 ENCOUNTER — Other Ambulatory Visit: Payer: Self-pay

## 2021-01-06 ENCOUNTER — Other Ambulatory Visit: Payer: Self-pay | Admitting: Internal Medicine

## 2021-01-06 DIAGNOSIS — E1165 Type 2 diabetes mellitus with hyperglycemia: Secondary | ICD-10-CM

## 2021-01-06 MED FILL — Gabapentin Cap 300 MG: ORAL | 30 days supply | Qty: 30 | Fill #4 | Status: AC

## 2021-01-06 NOTE — Telephone Encounter (Signed)
  Notes to clinic: This request has changes from the previous prescription.   Requested Prescriptions  Pending Prescriptions Disp Refills   glucose blood (TRUE METRIX BLOOD GLUCOSE TEST) test strip 100 strip 12    Sig: USE AS INSTRUCTED     Endocrinology: Diabetes - Testing Supplies Passed - 01/06/2021 11:59 AM      Passed - Valid encounter within last 12 months    Recent Outpatient Visits           1 month ago Type 2 diabetes mellitus with peripheral neuropathy ([place])   [place], Neoma Laming B, MD   7 months ago Type 2 diabetes mellitus with peripheral neuropathy [facility])   [place] Karle Plumber B, MD   8 months ago Type 2 diabetes mellitus with hyperglycemia, with long-term current use of insulin [facility])   [place], Annie Main L, RPH-CPP   9 months ago Type 2 diabetes mellitus with hyperglycemia, with long-term current use of insulin [facility])   [place], Annie Main L, RPH-CPP   9 months ago Type 2 diabetes mellitus with hyperglycemia, with long-term current use of insulin [facility])   [place], RPH-CPP       Future Appointments             In 2 months Wynetta Emery, Dalbert Batman, MD [place]

## 2021-01-09 ENCOUNTER — Other Ambulatory Visit: Payer: Self-pay

## 2021-01-09 MED ORDER — TRUE METRIX BLOOD GLUCOSE TEST VI STRP
ORAL_STRIP | 12 refills | Status: DC
  Filled 2021-01-09: qty 100, 25d supply, fill #0
  Filled 2021-01-23: qty 100, 15d supply, fill #0
  Filled 2021-03-11: qty 100, 15d supply, fill #1
  Filled 2021-05-07: qty 100, 15d supply, fill #2

## 2021-01-15 ENCOUNTER — Encounter: Payer: Self-pay | Admitting: Family Medicine

## 2021-01-16 ENCOUNTER — Other Ambulatory Visit: Payer: Self-pay

## 2021-01-23 ENCOUNTER — Other Ambulatory Visit: Payer: Self-pay

## 2021-01-24 ENCOUNTER — Other Ambulatory Visit: Payer: Self-pay

## 2021-01-24 ENCOUNTER — Ambulatory Visit (INDEPENDENT_AMBULATORY_CARE_PROVIDER_SITE_OTHER): Payer: Medicaid Other | Admitting: Endocrinology

## 2021-01-24 VITALS — BP 110/74 | HR 98 | Ht 61.0 in | Wt 230.0 lb

## 2021-01-24 DIAGNOSIS — Z794 Long term (current) use of insulin: Secondary | ICD-10-CM

## 2021-01-24 DIAGNOSIS — E1165 Type 2 diabetes mellitus with hyperglycemia: Secondary | ICD-10-CM

## 2021-01-24 DIAGNOSIS — E1142 Type 2 diabetes mellitus with diabetic polyneuropathy: Secondary | ICD-10-CM

## 2021-01-24 LAB — POCT GLYCOSYLATED HEMOGLOBIN (HGB A1C): Hemoglobin A1C: 8.2 % — AB (ref 4.0–5.6)

## 2021-01-24 MED ORDER — LANTUS SOLOSTAR 100 UNIT/ML ~~LOC~~ SOPN
130.0000 [IU] | PEN_INJECTOR | SUBCUTANEOUS | 3 refills | Status: DC
  Filled 2021-01-24 – 2021-02-03 (×2): qty 39, 30d supply, fill #0
  Filled 2021-03-05: qty 39, 30d supply, fill #1

## 2021-01-24 MED ORDER — DAPAGLIFLOZIN PROPANEDIOL 5 MG PO TABS
5.0000 mg | ORAL_TABLET | Freq: Every day | ORAL | 3 refills | Status: DC
  Filled 2021-01-24 – 2021-02-03 (×2): qty 30, 30d supply, fill #0
  Filled 2021-03-05: qty 30, 30d supply, fill #1
  Filled 2021-04-14: qty 30, 30d supply, fill #2
  Filled 2021-05-09: qty 30, 30d supply, fill #3
  Filled 2021-06-10: qty 30, 30d supply, fill #0
  Filled 2021-09-29: qty 30, 30d supply, fill #1

## 2021-01-24 NOTE — Patient Instructions (Addendum)
check your blood sugar twice a day.  vary the time of day when you check, between before the 3 meals, and at bedtime.  also check if you have symptoms of your blood sugar being too high or too low.  please keep a record of the readings and bring it to your next appointment here (or you can bring the meter itself).  You can write it on any piece of paper.  please call us sooner if your blood sugar goes below 70, or if most of your readings are over 200.    I have sent 2 prescriptions to your pharmacy: to reduce the Farxiga, and to increase the Lantus. Please continue the same other diabetes medications.   Please come back for a follow-up appointment in 2 months.

## 2021-01-24 NOTE — Progress Notes (Signed)
Subjective:    Patient ID: Teresa Camacho, female    DOB: 28-Feb-1966, 55 y.o.   MRN: [id number]  HPI Pt returns for f/u of diabetes mellitus:  DM type: Insulin-requiring type 2 Dx'ed: 2376 Complications: none Therapy: insulin since soon after dx, Ozempic, and metformin.  GDM: never DKA: never Severe hypoglycemia: never Pancreatitis: never Pancreatic imaging: normal on 2015 CT SDOH: she has "Orange card." Other: she takes qd insulin, at least for now; Ozempic dosage is limited by GI sxs. Interval history: she brings a record of her cbg's today.  cbg varies from 137-269.  She takes meds as rx'ed.  She has had vaginitis.   Past Medical History:  Diagnosis Date   Anemia    Blood transfusion without reported diagnosis    Fibroids, submucosal 05/03/2013   GERD (gastroesophageal reflux disease)    Hyperlipidemia    Hypertension    PCOS (polycystic ovarian syndrome)    PONV (postoperative nausea and vomiting)    " super tight airway" as per pt   Sleep apnea    Type II diabetes mellitus ([place])     Past Surgical History:  Procedure Laterality Date   ABDOMINAL HYSTERECTOMY N/A 06/13/2013   Procedure: HYSTERECTOMY ABDOMINAL and removal of vaginal skin tag;  Surgeon: Luz Lex, MD;  Location: [facility];  Service: Gynecology;  Laterality: N/A;   APPENDECTOMY     BALLOON DILATION N/A 08/27/2020   Procedure: BALLOON DILATION;  Surgeon: Irene Shipper, MD;  Location: [facility];  Service: Endoscopy;  Laterality: N/A;   CESAREAN SECTION     3x   CHOLECYSTECTOMY     CHOLECYSTECTOMY  1991   COLONOSCOPY WITH PROPOFOL N/A 08/27/2020   Procedure: COLONOSCOPY WITH PROPOFOL;  Surgeon: Irene Shipper, MD;  Location: [facility];  Service: Endoscopy;  Laterality: N/A;   ESOPHAGOGASTRODUODENOSCOPY (EGD) WITH PROPOFOL N/A 08/27/2020   Procedure: ESOPHAGOGASTRODUODENOSCOPY (EGD) WITH PROPOFOL;  Surgeon: Irene Shipper, MD;  Location: [facility];  Service: Endoscopy;  Laterality: N/A;   POLYPECTOMY   08/27/2020   Procedure: POLYPECTOMY;  Surgeon: Irene Shipper, MD;  Location: [facility];  Service: Endoscopy;;    Social History   Socioeconomic History   Marital status: Divorced    Spouse name: Not on file   Number of children: 5   Years of education: Not on file   Highest education level: Not on file  Occupational History   Not on file  Tobacco Use   Smoking status: Never   Smokeless tobacco: Never  Vaping Use   Vaping Use: Never used  Substance and Sexual Activity   Alcohol use: No   Drug use: No   Sexual activity: Never  Other Topics Concern   Not on file  Social History Narrative   Not on file   Social Determinants of Health   Financial Resource Strain: Not on file  Food Insecurity: Not on file  Transportation Needs: Not on file  Physical Activity: Not on file  Stress: Not on file  Social Connections: Not on file  Intimate Partner Violence: Not on file    Current Outpatient Medications on File Prior to Visit  Medication Sig Dispense Refill   acetaminophen (TYLENOL) 500 MG tablet Take 1,000 mg by mouth every 6 (six) hours as needed for mild pain.     aspirin EC 81 MG tablet Take 1 tablet (81 mg total) by mouth daily. Swallow whole. (Patient taking differently: Take 81 mg by mouth in the morning. Swallow whole.) 100  tablet 2   atorvastatin (LIPITOR) 80 MG tablet TAKE ONE TABLET BY MOUTH DAILY 90 tablet 1   blood glucose meter kit and supplies KIT Dispense based on patient and insurance preference. Use up to four times daily as directed. (FOR ICD-9 250.00, 250.01). 1 each 0   Blood Glucose Monitoring Suppl (TRUE METRIX METER) w/Device KIT Use as directed 1 kit 0   Cholecalciferol (VITAMIN D-3) 125 MCG (5000 UT) TABS Take 5,000 Units by mouth in the morning.     desloratadine (CLARINEX) 5 MG tablet Take 5 mg by mouth at bedtime.     diphenhydrAMINE (BENADRYL) 25 mg capsule Take 50 mg by mouth at bedtime.     fenofibrate (TRICOR) 48 MG tablet TAKE ONE TABLET BY  MOUTH DAILY 30 tablet 2   gabapentin (NEURONTIN) 300 MG capsule TAKE 1 CAPSULE (300 MG TOTAL) BY MOUTH AT BEDTIME. (Patient taking differently: Take 600 mg by mouth at bedtime.) 90 capsule 3   glucose blood (TRUE METRIX BLOOD GLUCOSE TEST) test strip USE AS INSTRUCTED 100 strip 12   Insulin Pen Needle 32G X 4 MM MISC 1 each by Does not apply route in the morning and at bedtime. 100 each 1   lisinopril (ZESTRIL) 10 MG tablet TAKE ONE TABLET BY MOUTH DAILY 90 tablet 1   loratadine (CLARITIN) 10 MG tablet Take 1 tablet (10 mg total) by mouth daily. 30 tablet 6   Melatonin 10 MG SUBL Place 20 mg under the tongue at bedtime.     metFORMIN (GLUCOPHAGE-XR) 500 MG 24 hr tablet Take 4 tablets (2,000 mg total) by mouth daily. 360 tablet 3   metoprolol tartrate (LOPRESSOR) 25 MG tablet TAKE ONE TABLET BY MOUTH TWICE A DAY 360 tablet 1   Multiple Vitamins-Minerals (MULTIVITAMIN WITH MINERALS) tablet Take 1 tablet by mouth daily.     Na Sulfate-K Sulfate-Mg Sulf 17.5-3.13-1.6 GM/177ML SOLN TAKE AS 1 TIME DOSE 354 mL 0   pantoprazole (PROTONIX) 40 MG tablet TAKE ONE TABLET BY MOUTH TWICE A DAY BEFORE A MEAL 180 tablet 1   Semaglutide,0.25 or 0.5MG/DOS, 2 MG/1.5ML SOPN Inject 0.25 mg into the skin once a week. 1.5 mL 3   traZODone (DESYREL) 100 MG tablet Take 100 mg by mouth at bedtime.     No current facility-administered medications on file prior to visit.    Allergies  Allergen Reactions   Wound Dressing Adhesive Other (See Comments)    Redness/irritation   Codeine Nausea And Vomiting    Pt is able to take percocet & vicodin   Latex Rash    irritation    Family History  Problem Relation Age of Onset   Depression Mother    Diabetes Mother    Arthritis Mother    Heart disease Mother    Learning disabilities Mother    Mental illness Mother    Liver cancer Mother        metastatic from colon??   Arthritis Father    Prostate cancer Father        met to esophagus   Colon polyps Sister     Cancer Maternal Aunt        x 2 Aunts    BP 110/74 (BP Location: Right Arm, Patient Position: Sitting, Cuff Size: Large)   Pulse 98   Ht 5' 1"  (1.549 m)   Wt 230 lb (104.3 kg)   LMP 04/17/2013   SpO2 96%   BMI 43.46 kg/m    Review of Systems  Objective:   Physical Exam Pulses: dorsalis pedis intact bilat.   MSK: no deformity of the feet CV: trace bilat leg edema Skin:  no ulcer on the feet.  normal color and temp on the feet. Neuro: sensation is intact to touch on the feet, but decreased from normal  Lab Results  Component Value Date   HGBA1C 8.2 (A) 01/24/2021      Assessment & Plan:  Insulin-requiring type 2 DM: overcontrolled. Vaginitis, due to [place].    Patient Instructions  check your blood sugar twice a day.  vary the time of day when you check, between before the 3 meals, and at bedtime.  also check if you have symptoms of your blood sugar being too high or too low.  please keep a record of the readings and bring it to your next appointment here (or you can bring the meter itself).  You can write it on any piece of paper.  please call us sooner if your blood sugar goes below 70, or if most of your readings are over 200.    I have sent 2 prescriptions to your pharmacy: to reduce the Farxiga, and to increase the Lantus. Please continue the same other diabetes medications.   Please come back for a follow-up appointment in 2 months.

## 2021-01-31 ENCOUNTER — Other Ambulatory Visit: Payer: Self-pay

## 2021-02-03 ENCOUNTER — Other Ambulatory Visit: Payer: Self-pay

## 2021-02-07 ENCOUNTER — Other Ambulatory Visit: Payer: Self-pay

## 2021-02-07 MED FILL — Gabapentin Cap 300 MG: ORAL | 30 days supply | Qty: 30 | Fill #5 | Status: AC

## 2021-02-19 ENCOUNTER — Other Ambulatory Visit: Payer: Self-pay

## 2021-02-19 ENCOUNTER — Other Ambulatory Visit: Payer: Self-pay | Admitting: Internal Medicine

## 2021-02-19 DIAGNOSIS — Z794 Long term (current) use of insulin: Secondary | ICD-10-CM

## 2021-02-19 DIAGNOSIS — E1165 Type 2 diabetes mellitus with hyperglycemia: Secondary | ICD-10-CM

## 2021-02-19 NOTE — Telephone Encounter (Signed)
Requested medications are due for refill today. yes  Requested medications are on the active medications list.  no  Last refill. na  Future visit scheduled.   yes  Notes to clinic.  No rx for lancets.

## 2021-02-21 ENCOUNTER — Other Ambulatory Visit: Payer: Self-pay

## 2021-02-21 MED ORDER — TRUEPLUS LANCETS 28G MISC
4 refills | Status: DC
  Filled 2021-02-21: qty 100, 90d supply, fill #0
  Filled 2021-04-17: qty 100, 25d supply, fill #1

## 2021-02-24 ENCOUNTER — Other Ambulatory Visit: Payer: Self-pay | Admitting: Internal Medicine

## 2021-02-24 DIAGNOSIS — E1169 Type 2 diabetes mellitus with other specified complication: Secondary | ICD-10-CM

## 2021-02-24 DIAGNOSIS — E785 Hyperlipidemia, unspecified: Secondary | ICD-10-CM

## 2021-02-24 NOTE — Telephone Encounter (Signed)
Requested Prescriptions  Pending Prescriptions Disp Refills  . fenofibrate (TRICOR) 48 MG tablet [Pharmacy Med Name: FENOFIBRATE 48 MG TABLET] 25 tablet 0    Sig: TAKE ONE TABLET BY MOUTH DAILY     Cardiovascular:  Antilipid - Fibric Acid Derivatives Failed - 02/24/2021  8:58 AM      Failed - Total Cholesterol in normal range and within 360 days    Cholesterol, Total  Date Value Ref Range Status  04/05/2020 82 (L) 100 - 199 mg/dL Final         Failed - HDL in normal range and within 360 days    HDL  Date Value Ref Range Status  04/05/2020 33 (L) >39 mg/dL Final         Failed - Triglycerides in normal range and within 360 days    Triglycerides  Date Value Ref Range Status  04/05/2020 163 (H) 0 - 149 mg/dL Final         Failed - ALT in normal range and within 180 days    ALT  Date Value Ref Range Status  06/14/2013 11 0 - 35 U/L Final         Failed - AST in normal range and within 180 days    AST  Date Value Ref Range Status  06/14/2013 11 0 - 37 U/L Final         Failed - Cr in normal range and within 180 days    Creatinine, Ser  Date Value Ref Range Status  12/02/2019 0.49 0.44 - 1.00 mg/dL Final         Failed - eGFR in normal range and within 180 days    GFR calc Af Amer  Date Value Ref Range Status  12/02/2019 >60 >60 mL/min Final   GFR calc non Af Amer  Date Value Ref Range Status  12/02/2019 >60 >60 mL/min Final         Passed - LDL in normal range and within 360 days    LDL Chol Calc (NIH)  Date Value Ref Range Status  04/05/2020 22 0 - 99 mg/dL Final   Direct LDL  Date Value Ref Range Status  12/02/2019 95.5 0 - 99 mg/dL Final    Comment:    Performed at [facility], [address] [place], [place] [address]         Passed - Valid encounter within last 12 months    Recent Outpatient Visits          3 months ago Type 2 diabetes mellitus with peripheral neuropathy [facility])   [place], Neoma Laming B,  MD   8 months ago Type 2 diabetes mellitus with peripheral neuropathy [facility])   [place] Karle Plumber B, MD   9 months ago Type 2 diabetes mellitus with hyperglycemia, with long-term current use of insulin [facility])   [place], Annie Main L, RPH-CPP   10 months ago Type 2 diabetes mellitus with hyperglycemia, with long-term current use of insulin [facility])   [place], Jarome [place]atin, RPH-CPP   11 months ago Type 2 diabetes mellitus with hyperglycemia, with long-term current use of insulin [facility])   [place], RPH-CPP      Future Appointments            In 2 weeks Ladell Pier, MD [facility]

## 2021-03-05 ENCOUNTER — Other Ambulatory Visit: Payer: Self-pay

## 2021-03-05 MED FILL — Gabapentin Cap 300 MG: ORAL | 30 days supply | Qty: 30 | Fill #6 | Status: AC

## 2021-03-11 ENCOUNTER — Other Ambulatory Visit: Payer: Self-pay

## 2021-03-13 ENCOUNTER — Encounter: Payer: Self-pay | Admitting: Internal Medicine

## 2021-03-13 ENCOUNTER — Other Ambulatory Visit: Payer: Self-pay

## 2021-03-13 ENCOUNTER — Ambulatory Visit: Payer: Self-pay | Attending: Internal Medicine | Admitting: Internal Medicine

## 2021-03-13 VITALS — BP 115/71 | HR 94 | Resp 16 | Wt 230.2 lb

## 2021-03-13 DIAGNOSIS — Z1231 Encounter for screening mammogram for malignant neoplasm of breast: Secondary | ICD-10-CM

## 2021-03-13 DIAGNOSIS — G4733 Obstructive sleep apnea (adult) (pediatric): Secondary | ICD-10-CM

## 2021-03-13 DIAGNOSIS — Z9989 Dependence on other enabling machines and devices: Secondary | ICD-10-CM

## 2021-03-13 DIAGNOSIS — E1142 Type 2 diabetes mellitus with diabetic polyneuropathy: Secondary | ICD-10-CM

## 2021-03-13 DIAGNOSIS — Z6841 Body Mass Index (BMI) 40.0 and over, adult: Secondary | ICD-10-CM

## 2021-03-13 DIAGNOSIS — E785 Hyperlipidemia, unspecified: Secondary | ICD-10-CM

## 2021-03-13 DIAGNOSIS — Z23 Encounter for immunization: Secondary | ICD-10-CM

## 2021-03-13 DIAGNOSIS — E1169 Type 2 diabetes mellitus with other specified complication: Secondary | ICD-10-CM

## 2021-03-13 DIAGNOSIS — I10 Essential (primary) hypertension: Secondary | ICD-10-CM

## 2021-03-13 MED ORDER — GABAPENTIN 300 MG PO CAPS
600.0000 mg | ORAL_CAPSULE | Freq: Every day | ORAL | 5 refills | Status: DC
  Filled 2021-03-13 – 2021-04-16 (×2): qty 60, 30d supply, fill #0
  Filled 2021-05-26: qty 60, 30d supply, fill #1
  Filled 2021-05-26: qty 60, 30d supply, fill #0
  Filled 2021-07-02: qty 60, 30d supply, fill #1
  Filled 2021-08-04: qty 60, 30d supply, fill #2
  Filled 2021-09-29: qty 60, 30d supply, fill #3
  Filled 2021-10-30: qty 60, 30d supply, fill #4

## 2021-03-13 NOTE — Progress Notes (Signed)
Pt states she wants to discuss weight loss surgery

## 2021-03-13 NOTE — Patient Instructions (Signed)
Increase gabapentin to 600 mg at bedtime. I will have our caseworker check into options for CPAP machine for you.

## 2021-03-13 NOTE — Progress Notes (Signed)
Patient ID: Teresa Camacho, female    DOB: 07-29-1965  MRN: [id number]  CC: Hypertension   Subjective: Teresa Camacho is a 55 y.o. female who presents for chronic ds management Her concerns today include:  Patient with history of DM type II with microalbumin, HTN, HL, OSA on CPAP, GERD,PCOS, pattern baldness.  HYPERTENSION Currently taking: see medication list.  She is on metoprolol and lisinopril Med Adherence: [x]  Yes    []  No Medication side effects: []  Yes    [x]  No Adherence with salt restriction: [x]  Yes    []  No Home Monitoring?: []  Yes    [x]  No no device Monitoring Frequency:  Home BP results range:  SOB? []  Yes    [x]  No Chest Pain?: []  Yes    [x]  No  HL: compliant with and tolerating Lipitor and Tricor.  Last LDL was not at goal.   DM/Obesity Lab Results  Component Value Date   HGBA1C 8.2 (A) 01/24/2021  -saw Dr. Loanne Drilling last mth. She was not tolerating [place] as well.  Dose decreased to 5 mg and  Lantus increased to 150.  Continued on Metformin.  Ozempic dose decreased b/c it was upsetting stomach Has f/u appt next mth. Checking BS before BF and after dinner.  A.m BS 140-180.  It was 161 this a.m -would like to be considered for wgh reduction surgery.  Has OC and wonders if it would cover. Reports having seen nutritionist in the past. -Needs eye exam.  No insurance. -Still has a lot of foot pain especially at nights related to her neuropathy.  She is on gabapentin 300 mg at bedtime.  OSA on CPAP:  machine is say it has exceeded its life expectancy but it is still working for now.  Had it 5 yrs. she thinks it would give out soon and wanted to have a plan in place to get another one when it turns.  HM: agrees for flu and Prevar 20 today.   COVID - had J&J initially, then 3 Pfizer vaccines Needs MMG. Patient Active Problem List   Diagnosis Date Noted   Colon cancer screening    Benign neoplasm of transverse colon    Dysphagia    Esophageal stricture    COVID-19  vaccine regimen to maintain immunity completed 05/31/2020   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   History of allergy 12/26/2019   OSA on CPAP 12/26/2019   Class 3 severe obesity due to excess calories with serious comorbidity and body mass index (BMI) of 40.0 to 44.9 in adult [facility]) 12/26/2019   Hyperlipidemia associated with type 2 diabetes mellitus ([place]) 12/26/2019   Essential hypertension 12/26/2019   Family history of colonic polyps 12/26/2019   Gastroesophageal reflux disease 12/26/2019   Type 2 diabetes mellitus with hyperglycemia, with long-term current use of insulin ([place]) 11/30/2019   Iron deficiency anemia due to chronic blood loss 06/28/2013   S/P hysterectomy 06/13/2013     Current Outpatient Medications on File Prior to Visit  Medication Sig Dispense Refill   acetaminophen (TYLENOL) 500 MG tablet Take 1,000 mg by mouth every 6 (six) hours as needed for mild pain.     aspirin EC 81 MG tablet Take 1 tablet (81 mg total) by mouth daily. Swallow whole. (Patient taking differently: Take 81 mg by mouth in the morning. Swallow whole.) 100 tablet 2   atorvastatin (LIPITOR) 80 MG tablet TAKE ONE TABLET BY MOUTH DAILY 90 tablet 1   blood glucose meter kit and supplies KIT  Dispense based on patient and insurance preference. Use up to four times daily as directed. (FOR ICD-9 250.00, 250.01). 1 each 0   Blood Glucose Monitoring Suppl (TRUE METRIX METER) w/Device KIT Use as directed 1 kit 0   Cholecalciferol (VITAMIN D-3) 125 MCG (5000 UT) TABS Take 5,000 Units by mouth in the morning.     dapagliflozin propanediol (FARXIGA) 5 MG TABS tablet Take 1 tablet (5 mg total) by mouth daily. 90 tablet 3   desloratadine (CLARINEX) 5 MG tablet Take 5 mg by mouth at bedtime.     diphenhydrAMINE (BENADRYL) 25 mg capsule Take 50 mg by mouth at bedtime.     fenofibrate (TRICOR) 48 MG tablet TAKE ONE TABLET BY MOUTH DAILY 25 tablet 0   glucose blood (TRUE METRIX BLOOD GLUCOSE TEST) test strip USE AS INSTRUCTED  100 strip 12   insulin glargine (LANTUS SOLOSTAR) 100 UNIT/ML Solostar Pen Inject 130 Units into the skin every morning. 120 mL 3   Insulin Pen Needle 32G X 4 MM MISC 1 each by Does not apply route in the morning and at bedtime. 100 each 1   lisinopril (ZESTRIL) 10 MG tablet TAKE ONE TABLET BY MOUTH DAILY 90 tablet 1   loratadine (CLARITIN) 10 MG tablet Take 1 tablet (10 mg total) by mouth daily. 30 tablet 6   Melatonin 10 MG SUBL Place 20 mg under the tongue at bedtime.     metFORMIN (GLUCOPHAGE-XR) 500 MG 24 hr tablet Take 4 tablets (2,000 mg total) by mouth daily. 360 tablet 3   metoprolol tartrate (LOPRESSOR) 25 MG tablet TAKE ONE TABLET BY MOUTH TWICE A DAY 360 tablet 1   Multiple Vitamins-Minerals (MULTIVITAMIN WITH MINERALS) tablet Take 1 tablet by mouth daily.     Na Sulfate-K Sulfate-Mg Sulf 17.5-3.13-1.6 GM/177ML SOLN TAKE AS 1 TIME DOSE 354 mL 0   pantoprazole (PROTONIX) 40 MG tablet TAKE ONE TABLET BY MOUTH TWICE A DAY BEFORE A MEAL 180 tablet 1   Semaglutide,0.25 or 0.5MG/DOS, 2 MG/1.5ML SOPN Inject 0.25 mg into the skin once a week. 1.5 mL 3   traZODone (DESYREL) 100 MG tablet Take 100 mg by mouth at bedtime.     TRUEplus Lancets 28G MISC USE AS DIRECTED 100 each 4   No current facility-administered medications on file prior to visit.    Allergies  Allergen Reactions   Wound Dressing Adhesive Other (See Comments)    Redness/irritation   Codeine Nausea And Vomiting    Pt is able to take percocet & vicodin   Latex Rash    irritation    Social History   Socioeconomic History   Marital status: Divorced    Spouse name: Not on file   Number of children: 5   Years of education: Not on file   Highest education level: Not on file  Occupational History   Not on file  Tobacco Use   Smoking status: Never   Smokeless tobacco: Never  Vaping Use   Vaping Use: Never used  Substance and Sexual Activity   Alcohol use: No   Drug use: No   Sexual activity: Never  Other Topics  Concern   Not on file  Social History Narrative   Not on file   Social Determinants of Health   Financial Resource Strain: Not on file  Food Insecurity: Not on file  Transportation Needs: Not on file  Physical Activity: Not on file  Stress: Not on file  Social Connections: Not on file  Intimate Partner  Violence: Not on file    Family History  Problem Relation Age of Onset   Depression Mother    Diabetes Mother    Arthritis Mother    Heart disease Mother    Learning disabilities Mother    Mental illness Mother    Liver cancer Mother        metastatic from colon??   Arthritis Father    Prostate cancer Father        met to esophagus   Colon polyps Sister    Cancer Maternal Aunt        x 2 Aunts    Past Surgical History:  Procedure Laterality Date   ABDOMINAL HYSTERECTOMY N/A 06/13/2013   Procedure: HYSTERECTOMY ABDOMINAL and removal of vaginal skin tag;  Surgeon: Luz Lex, MD;  Location: [facility];  Service: Gynecology;  Laterality: N/A;   APPENDECTOMY     BALLOON DILATION N/A 08/27/2020   Procedure: BALLOON DILATION;  Surgeon: Irene Shipper, MD;  Location: [facility];  Service: Endoscopy;  Laterality: N/A;   CESAREAN SECTION     3x   CHOLECYSTECTOMY     CHOLECYSTECTOMY  1991   COLONOSCOPY WITH PROPOFOL N/A 08/27/2020   Procedure: COLONOSCOPY WITH PROPOFOL;  Surgeon: Irene Shipper, MD;  Location: [facility];  Service: Endoscopy;  Laterality: N/A;   ESOPHAGOGASTRODUODENOSCOPY (EGD) WITH PROPOFOL N/A 08/27/2020   Procedure: ESOPHAGOGASTRODUODENOSCOPY (EGD) WITH PROPOFOL;  Surgeon: Irene Shipper, MD;  Location: [facility];  Service: Endoscopy;  Laterality: N/A;   POLYPECTOMY  08/27/2020   Procedure: POLYPECTOMY;  Surgeon: Irene Shipper, MD;  Location: [facility];  Service: Endoscopy;;    ROS: Review of Systems Negative except as stated above  PHYSICAL EXAM: BP 115/71   Pulse 94   Resp 16   Wt 230 lb 3.2 oz (104.4 kg)   LMP 04/17/2013   SpO2 95%   BMI  43.50 kg/m   Wt Readings from Last 3 Encounters:  03/13/21 230 lb 3.2 oz (104.4 kg)  01/24/21 230 lb (104.3 kg)  01/02/21 229 lb 9.6 oz (104.1 kg)    Physical Exam  General appearance - alert, well appearing, obese middle-aged older Caucasian female and in no distress Mental status - normal mood, behavior, speech, dress, motor activity, and thought processes Neck - supple, no significant adenopathy Chest - clear to auscultation, no wheezes, rales or rhonchi, symmetric air entry Heart - normal rate, regular rhythm, normal S1, S2, no murmurs, rubs, clicks or gallops Extremities - peripheral pulses normal, no pedal edema, no clubbing or cyanosis   CMP Latest Ref Rng & Units 12/02/2019 12/01/2019 11/30/2019  Glucose 70 - 99 mg/dL 323(H) 329(H) 306(H)  BUN 6 - 20 mg/dL 10 9 11   Creatinine 0.44 - 1.00 mg/dL 0.49 0.58 0.56  Sodium 135 - 145 mmol/L 137 137 136  Potassium 3.5 - 5.1 mmol/L 3.8 3.9 3.7  Chloride 98 - 111 mmol/L 103 102 101  CO2 22 - 32 mmol/L 21(L) 21(L) 19(L)  Calcium 8.9 - 10.3 mg/dL 8.9 8.6(L) 9.1  Total Protein 6.0 - 8.3 g/dL - - -  Total Bilirubin 0.3 - 1.2 mg/dL - - -  Alkaline Phos 39 - 117 U/L - - -  AST 0 - 37 U/L - - -  ALT 0 - 35 U/L - - -   Lipid Panel     Component Value Date/Time   CHOL 82 (L) 04/05/2020 0923   TRIG 163 (H) 04/05/2020 0923   HDL 33 (L) 04/05/2020  0923   CHOLHDL 2.5 04/05/2020 0923   CHOLHDL 8.1 12/02/2019 0648   VLDL UNABLE TO CALCULATE IF TRIGLYCERIDE OVER 400 mg/dL 12/02/2019 0648   LDLCALC 22 04/05/2020 0923   LDLDIRECT 95.5 12/02/2019 0648    CBC    Component Value Date/Time   WBC 7.9 12/01/2019 0537   RBC 5.08 12/01/2019 0537   HGB 14.6 12/01/2019 0537   HCT 45.0 12/01/2019 0537   PLT 286 12/01/2019 0537   MCV 88.6 12/01/2019 0537   MCH 28.7 12/01/2019 0537   MCHC 32.4 12/01/2019 0537   RDW 13.6 12/01/2019 0537   LYMPHSABS 3.2 06/14/2013 2217   MONOABS 1.0 06/14/2013 2217   EOSABS 0.4 06/14/2013 2217   BASOSABS 0.0  06/14/2013 2217    ASSESSMENT AND PLAN: 1. Type 2 diabetes mellitus with peripheral neuropathy (HCC) Being managed by endocrinology.  She will continue her current medications as prescribed. Encourage healthy eating habits. Recommend increasing the gabapentin to 600 mg at bedtime since this is when her neuropathy is most bothersome. - Amb ref to Medical Nutrition Therapy-MNT - gabapentin (NEURONTIN) 300 MG capsule; Take 2 capsules (600 mg total) by mouth at bedtime.  Dispense: 60 capsule; Refill: 5 - CBC - Comprehensive metabolic panel - Microalbumin / creatinine urine ratio  2. Morbid obesity with BMI of 40.0-44.9, adult ([place]) I told patient that she certainly would qualify for weight reduction surgery based on her BMI and obesity related diagnoses.  However limiting factor may be lack of insurance.  She does have the orange card and I am not sure whether this would cover her for bariatric surgery. - Amb ref to Medical Nutrition Therapy-MNT - Amb Referral to Bariatric Surgery  3. Essential hypertension At goal.  Continue current medications.  4. Hyperlipidemia associated with type 2 diabetes mellitus ([place]) Continue current medications. - Lipid panel  5. OSA on CPAP I will send a message to our caseworker to see whether there is any programs out there that would be able to help her in getting a new or refurbish CPAP machine  6. Need for immunization against influenza - Flu Vaccine QUAD 87moIM (Fluarix, Fluzone & Alfiuria Quad PF)  7. Need for vaccination against Streptococcus pneumoniae Prevnar 20 given.  8. Encounter for screening mammogram for malignant neoplasm of breast Mammogram ordered.  Patient given scholarship application.     Patient was given the opportunity to ask questions.  Patient verbalized understanding of the plan and was able to repeat key elements of the plan.   Orders Placed This Encounter  Procedures   MM Digital Screening   Flu Vaccine QUAD 663moM  (Fluarix, Fluzone & Alfiuria Quad PF)   CBC   Comprehensive metabolic panel   Lipid panel   Microalbumin / creatinine urine ratio   Amb ref to Medical Nutrition Therapy-MNT   Amb Referral to Bariatric Surgery     Requested Prescriptions   Signed Prescriptions Disp Refills   gabapentin (NEURONTIN) 300 MG capsule 60 capsule 5    Sig: Take 2 capsules (600 mg total) by mouth at bedtime.    Return in about 4 months (around 07/14/2021).  DeKarle PlumberMD, FACP

## 2021-03-14 ENCOUNTER — Telehealth: Payer: Self-pay

## 2021-03-14 LAB — COMPREHENSIVE METABOLIC PANEL
ALT: 36 IU/L — ABNORMAL HIGH (ref 0–32)
AST: 26 IU/L (ref 0–40)
Albumin/Globulin Ratio: 1.9 (ref 1.2–2.2)
Albumin: 4.7 g/dL (ref 3.8–4.9)
Alkaline Phosphatase: 81 IU/L (ref 44–121)
BUN/Creatinine Ratio: 18 (ref 9–23)
BUN: 13 mg/dL (ref 6–24)
Bilirubin Total: 0.9 mg/dL (ref 0.0–1.2)
CO2: 18 mmol/L — ABNORMAL LOW (ref 20–29)
Calcium: 10.4 mg/dL — ABNORMAL HIGH (ref 8.7–10.2)
Chloride: 103 mmol/L (ref 96–106)
Creatinine, Ser: 0.73 mg/dL (ref 0.57–1.00)
Globulin, Total: 2.5 g/dL (ref 1.5–4.5)
Glucose: 163 mg/dL — ABNORMAL HIGH (ref 70–99)
Potassium: 4.6 mmol/L (ref 3.5–5.2)
Sodium: 140 mmol/L (ref 134–144)
Total Protein: 7.2 g/dL (ref 6.0–8.5)
eGFR: 98 mL/min/{1.73_m2} (ref >59)

## 2021-03-14 LAB — LIPID PANEL
Chol/HDL Ratio: 2.6 ratio (ref 0.0–4.4)
Cholesterol, Total: 87 mg/dL — ABNORMAL LOW (ref 100–199)
HDL: 33 mg/dL — ABNORMAL LOW (ref >39)
LDL Chol Calc (NIH): 25 mg/dL (ref 0–99)
Triglycerides: 178 mg/dL — ABNORMAL HIGH (ref 0–149)
VLDL Cholesterol Cal: 29 mg/dL (ref 5–40)

## 2021-03-14 LAB — CBC
Hematocrit: 45.6 % (ref 34.0–46.6)
Hemoglobin: 15.1 g/dL (ref 11.1–15.9)
MCH: 28 pg (ref 26.6–33.0)
MCHC: 33.1 g/dL (ref 31.5–35.7)
MCV: 84 fL (ref 79–97)
Platelets: 340 10*3/uL (ref 150–450)
RBC: 5.4 x10E6/uL — ABNORMAL HIGH (ref 3.77–5.28)
RDW: 12.9 % (ref 11.7–15.4)
WBC: 10.5 10*3/uL (ref 3.4–10.8)

## 2021-03-14 LAB — MICROALBUMIN / CREATININE URINE RATIO
Creatinine, Urine: 60.7 mg/dL
Microalb/Creat Ratio: 14 mg/g creat (ref 0–29)
Microalbumin, Urine: 8.3 ug/mL

## 2021-03-14 NOTE — Telephone Encounter (Signed)
-----   Message from Ladell Pier, MD sent at 03/14/2021 12:42 PM EDT ----- Let patient know that her blood cell counts are normal.  Kidney function normal.  She has slight elevation in one of her liver enzymes which we will observe for now.  Cholesterol level at goal.

## 2021-03-14 NOTE — Telephone Encounter (Signed)
patients Number is [id number]

## 2021-03-14 NOTE — Telephone Encounter (Signed)
Pt returned call regarding labs.  [phone]

## 2021-03-14 NOTE — Telephone Encounter (Signed)
Pt was called and VM is currently full.  CRM created.

## 2021-03-17 ENCOUNTER — Encounter: Payer: Medicaid Other | Attending: Internal Medicine | Admitting: Registered"

## 2021-03-17 ENCOUNTER — Telehealth: Payer: Self-pay | Admitting: Internal Medicine

## 2021-03-17 NOTE — Telephone Encounter (Signed)
Copied from [place] [id number]. Topic: Quick Communication - Lab Results (Clinic Use ONLY) >> Mar 14, 2021  5:07 PM Gomez Cleverly, CMA wrote: Called patient to inform them of  lab results. When patient returns call, triage nurse may disclose results.  Patient would like a call back from nurse to discuss test results Ph#  [phone]

## 2021-03-19 ENCOUNTER — Other Ambulatory Visit: Payer: Self-pay

## 2021-03-20 ENCOUNTER — Telehealth: Payer: Self-pay

## 2021-03-20 NOTE — Telephone Encounter (Signed)
Patient in need of new CPAP machine and is uninsured.   Call placed to patient and informed her about the [employer] (ASAA) CPAP Assistance Program.  They provide refurbished machines to individuals for a $100 program fee. There is no warranty or technical support that comes with the machine, it is provided " as is."   The program currently reports a wait of about 3.5-4 months for a machine to be available for delivery. The patient is uninsured and unable to afford the program fee at this time. Informed her that [facility] has funding to pay the program fee for her.  When she picks up the machine at [facility], she will be asked to sign a waiver of liability for the ASAA    She was in agreement with submitting the order for the CPAP machine to [employer].Explained to her that [facility] will contact her when the machine is delivered to the clinic.    She will need to provide this clinic with the CPAP pressure setting before the referral can be submitted.  She is requesting a medium size nasal pillow mask. She said she was not home at the time of this call and will call back with the pressure setting.

## 2021-03-22 ENCOUNTER — Other Ambulatory Visit: Payer: Self-pay | Admitting: Internal Medicine

## 2021-03-22 DIAGNOSIS — E1169 Type 2 diabetes mellitus with other specified complication: Secondary | ICD-10-CM

## 2021-03-22 NOTE — Telephone Encounter (Signed)
Requested Prescriptions  Pending Prescriptions Disp Refills  . fenofibrate (TRICOR) 48 MG tablet [Pharmacy Med Name: FENOFIBRATE 48 MG TABLET] 90 tablet 1    Sig: TAKE ONE TABLET BY MOUTH DAILY     Cardiovascular:  Antilipid - Fibric Acid Derivatives Failed - 03/22/2021 10:56 AM      Failed - Total Cholesterol in normal range and within 360 days    Cholesterol, Total  Date Value Ref Range Status  03/13/2021 87 (L) 100 - 199 mg/dL Final         Failed - HDL in normal range and within 360 days    HDL  Date Value Ref Range Status  03/13/2021 33 (L) >39 mg/dL Final         Failed - Triglycerides in normal range and within 360 days    Triglycerides  Date Value Ref Range Status  03/13/2021 178 (H) 0 - 149 mg/dL Final         Failed - ALT in normal range and within 180 days    ALT  Date Value Ref Range Status  03/13/2021 36 (H) 0 - 32 IU/L Final         Passed - LDL in normal range and within 360 days    LDL Chol Calc (NIH)  Date Value Ref Range Status  03/13/2021 25 0 - 99 mg/dL Final   Direct LDL  Date Value Ref Range Status  12/02/2019 95.5 0 - 99 mg/dL Final    Comment:    Performed at [facility], [place] [address], [place], [place] [address]         Passed - AST in normal range and within 180 days    AST  Date Value Ref Range Status  03/13/2021 26 0 - 40 IU/L Final         Passed - Cr in normal range and within 180 days    Creatinine, Ser  Date Value Ref Range Status  03/13/2021 0.73 0.57 - 1.00 mg/dL Final         Passed - eGFR in normal range and within 180 days    GFR calc Af Amer  Date Value Ref Range Status  12/02/2019 >60 >60 mL/min Final   GFR calc non Af Amer  Date Value Ref Range Status  12/02/2019 >60 >60 mL/min Final   eGFR  Date Value Ref Range Status  03/13/2021 98 >59 mL/min/1.73 Final         Passed - Valid encounter within last 12 months    Recent Outpatient Visits          1 week ago Type 2 diabetes mellitus with peripheral  neuropathy ([place])   [place] Karle Plumber B, MD   4 months ago Type 2 diabetes mellitus with peripheral neuropathy [facility])   [place] Karle Plumber B, MD   9 months ago Type 2 diabetes mellitus with peripheral neuropathy [facility])   [place] Karle Plumber B, MD   10 months ago Type 2 diabetes mellitus with hyperglycemia, with long-term current use of insulin [facility])   [place], Annie Main L, RPH-CPP   11 months ago Type 2 diabetes mellitus with hyperglycemia, with long-term current use of insulin [facility])   [place], Jarome Matin, RPH-CPP      Future Appointments  In 3 months Ladell Pier, MD [place]

## 2021-03-26 ENCOUNTER — Other Ambulatory Visit: Payer: Self-pay

## 2021-03-26 ENCOUNTER — Ambulatory Visit (INDEPENDENT_AMBULATORY_CARE_PROVIDER_SITE_OTHER): Payer: Medicaid Other | Admitting: Endocrinology

## 2021-03-26 VITALS — BP 128/88 | HR 75 | Ht 61.0 in | Wt 232.2 lb

## 2021-03-26 DIAGNOSIS — Z794 Long term (current) use of insulin: Secondary | ICD-10-CM

## 2021-03-26 DIAGNOSIS — E1165 Type 2 diabetes mellitus with hyperglycemia: Secondary | ICD-10-CM

## 2021-03-26 DIAGNOSIS — E1142 Type 2 diabetes mellitus with diabetic polyneuropathy: Secondary | ICD-10-CM

## 2021-03-26 LAB — POCT GLYCOSYLATED HEMOGLOBIN (HGB A1C): Hemoglobin A1C: 8 % — AB (ref 4.0–5.6)

## 2021-03-26 MED ORDER — HUMULIN N KWIKPEN 100 UNIT/ML ~~LOC~~ SUPN
130.0000 [IU] | PEN_INJECTOR | SUBCUTANEOUS | 3 refills | Status: DC
  Filled 2021-03-26: qty 30, 23d supply, fill #0
  Filled 2021-04-16: qty 30, 23d supply, fill #1
  Filled 2021-05-09: qty 27, 21d supply, fill #2
  Filled 2021-05-26: qty 27, 21d supply, fill #0
  Filled 2021-05-26: qty 27, 21d supply, fill #3

## 2021-03-26 NOTE — Patient Instructions (Addendum)
check your blood sugar twice a day.  vary the time of day when you check, between before the 3 meals, and at bedtime.  also check if you have symptoms of your blood sugar being too high or too low.  please keep a record of the readings and bring it to your next appointment here (or you can bring the meter itself).  You can write it on any piece of paper.  please call us sooner if your blood sugar goes below 70, or if most of your readings are over 200.    I have sent a prescription to your pharmacy, to change the Lantus to NPH, at the same amount.   Please continue the same other diabetes medications.   Please come back for a follow-up appointment in January.

## 2021-03-26 NOTE — Progress Notes (Signed)
Subjective:    Patient ID: Teresa Camacho, female    DOB: 1965-05-31, 55 y.o.   MRN: [id number]  HPI Pt returns for f/u of diabetes mellitus:  DM type: Insulin-requiring type 2 Dx'ed: 9373 Complications: none Therapy: insulin since soon after dx, Ozempic, and metformin.  GDM: never DKA: never Severe hypoglycemia: never Pancreatitis: never Pancreatic imaging: normal on 2015 CT SDOH: she has "Orange card." Other: she takes qd insulin, at least for now; Ozempic dosage is limited by GI sxs.   Interval history: she brings a record of her cbg's today.  cbg varies from 133-326.  It is in general higher as the day goes on. She takes meds as rx'ed.  I advised multiple daily injections, but she declines.   Past Medical History:  Diagnosis Date   Anemia    Blood transfusion without reported diagnosis    Fibroids, submucosal 05/03/2013   GERD (gastroesophageal reflux disease)    Hyperlipidemia    Hypertension    PCOS (polycystic ovarian syndrome)    PONV (postoperative nausea and vomiting)    " super tight airway" as per pt   Sleep apnea    Type II diabetes mellitus ([place])     Past Surgical History:  Procedure Laterality Date   ABDOMINAL HYSTERECTOMY N/A 06/13/2013   Procedure: HYSTERECTOMY ABDOMINAL and removal of vaginal skin tag;  Surgeon: Luz Lex, MD;  Location: [facility];  Service: Gynecology;  Laterality: N/A;   APPENDECTOMY     BALLOON DILATION N/A 08/27/2020   Procedure: BALLOON DILATION;  Surgeon: Irene Shipper, MD;  Location: [facility];  Service: Endoscopy;  Laterality: N/A;   CESAREAN SECTION     3x   CHOLECYSTECTOMY     CHOLECYSTECTOMY  1991   COLONOSCOPY WITH PROPOFOL N/A 08/27/2020   Procedure: COLONOSCOPY WITH PROPOFOL;  Surgeon: Irene Shipper, MD;  Location: [facility];  Service: Endoscopy;  Laterality: N/A;   ESOPHAGOGASTRODUODENOSCOPY (EGD) WITH PROPOFOL N/A 08/27/2020   Procedure: ESOPHAGOGASTRODUODENOSCOPY (EGD) WITH PROPOFOL;  Surgeon: Irene Shipper, MD;   Location: [facility];  Service: Endoscopy;  Laterality: N/A;   POLYPECTOMY  08/27/2020   Procedure: POLYPECTOMY;  Surgeon: Irene Shipper, MD;  Location: [facility];  Service: Endoscopy;;    Social History   Socioeconomic History   Marital status: Divorced    Spouse name: Not on file   Number of children: 5   Years of education: Not on file   Highest education level: Not on file  Occupational History   Not on file  Tobacco Use   Smoking status: Never   Smokeless tobacco: Never  Vaping Use   Vaping Use: Never used  Substance and Sexual Activity   Alcohol use: No   Drug use: No   Sexual activity: Never  Other Topics Concern   Not on file  Social History Narrative   Not on file   Social Determinants of Health   Financial Resource Strain: Not on file  Food Insecurity: Not on file  Transportation Needs: Not on file  Physical Activity: Not on file  Stress: Not on file  Social Connections: Not on file  Intimate Partner Violence: Not on file    Current Outpatient Medications on File Prior to Visit  Medication Sig Dispense Refill   acetaminophen (TYLENOL) 500 MG tablet Take 1,000 mg by mouth every 6 (six) hours as needed for mild pain.     aspirin EC 81 MG tablet Take 1 tablet (81 mg total) by mouth daily.  Swallow whole. (Patient taking differently: Take 81 mg by mouth in the morning. Swallow whole.) 100 tablet 2   atorvastatin (LIPITOR) 80 MG tablet TAKE ONE TABLET BY MOUTH DAILY 90 tablet 1   blood glucose meter kit and supplies KIT Dispense based on patient and insurance preference. Use up to four times daily as directed. (FOR ICD-9 250.00, 250.01). 1 each 0   Blood Glucose Monitoring Suppl (TRUE METRIX METER) w/Device KIT Use as directed 1 kit 0   Cholecalciferol (VITAMIN D-3) 125 MCG (5000 UT) TABS Take 5,000 Units by mouth in the morning.     dapagliflozin propanediol (FARXIGA) 5 MG TABS tablet Take 1 tablet (5 mg total) by mouth daily. 90 tablet 3   desloratadine  (CLARINEX) 5 MG tablet Take 5 mg by mouth at bedtime.     diphenhydrAMINE (BENADRYL) 25 mg capsule Take 50 mg by mouth at bedtime.     fenofibrate (TRICOR) 48 MG tablet TAKE ONE TABLET BY MOUTH DAILY 90 tablet 1   gabapentin (NEURONTIN) 300 MG capsule Take 2 capsules (600 mg total) by mouth at bedtime. 60 capsule 5   glucose blood (TRUE METRIX BLOOD GLUCOSE TEST) test strip USE AS INSTRUCTED 100 strip 12   Insulin Pen Needle 32G X 4 MM MISC 1 each by Does not apply route in the morning and at bedtime. 100 each 1   lisinopril (ZESTRIL) 10 MG tablet TAKE ONE TABLET BY MOUTH DAILY 90 tablet 1   loratadine (CLARITIN) 10 MG tablet Take 1 tablet (10 mg total) by mouth daily. 30 tablet 6   Melatonin 10 MG SUBL Place 20 mg under the tongue at bedtime.     metFORMIN (GLUCOPHAGE-XR) 500 MG 24 hr tablet Take 4 tablets (2,000 mg total) by mouth daily. 360 tablet 3   metoprolol tartrate (LOPRESSOR) 25 MG tablet TAKE ONE TABLET BY MOUTH TWICE A DAY 360 tablet 1   Multiple Vitamins-Minerals (MULTIVITAMIN WITH MINERALS) tablet Take 1 tablet by mouth daily.     Na Sulfate-K Sulfate-Mg Sulf 17.5-3.13-1.6 GM/177ML SOLN TAKE AS 1 TIME DOSE 354 mL 0   pantoprazole (PROTONIX) 40 MG tablet TAKE ONE TABLET BY MOUTH TWICE A DAY BEFORE A MEAL 180 tablet 1   Semaglutide,0.25 or 0.5MG/DOS, 2 MG/1.5ML SOPN Inject 0.25 mg into the skin once a week. 1.5 mL 3   traZODone (DESYREL) 100 MG tablet Take 100 mg by mouth at bedtime.     TRUEplus Lancets 28G MISC USE AS DIRECTED 100 each 4   No current facility-administered medications on file prior to visit.    Allergies  Allergen Reactions   Wound Dressing Adhesive Other (See Comments)    Redness/irritation   Codeine Nausea And Vomiting    Pt is able to take percocet & vicodin   Latex Rash    irritation    Family History  Problem Relation Age of Onset   Depression Mother    Diabetes Mother    Arthritis Mother    Heart disease Mother    Learning disabilities Mother     Mental illness Mother    Liver cancer Mother        metastatic from colon??   Arthritis Father    Prostate cancer Father        met to esophagus   Colon polyps Sister    Cancer Maternal Aunt        x 2 Aunts    BP 128/88 (BP Location: Left Arm, Patient Position: Sitting, Cuff Size: Normal)  Pulse 75   Ht 5' 1"  (1.549 m)   Wt 232 lb 3.2 oz (105.3 kg)   LMP 04/17/2013   SpO2 96%   BMI 43.87 kg/m    Review of Systems Denies n/v/hb    Objective:   Physical Exam  Lab Results  Component Value Date   HGBA1C 8.0 (A) 03/26/2021      Assessment & Plan:  Insulin-requiring type 2 DM: uncontrolled.   Patient Instructions  check your blood sugar twice a day.  vary the time of day when you check, between before the 3 meals, and at bedtime.  also check if you have symptoms of your blood sugar being too high or too low.  please keep a record of the readings and bring it to your next appointment here (or you can bring the meter itself).  You can write it on any piece of paper.  please call us sooner if your blood sugar goes below 70, or if most of your readings are over 200.    I have sent a prescription to your pharmacy, to change the Lantus to NPH, at the same amount.   Please continue the same other diabetes medications.   Please come back for a follow-up appointment in January.

## 2021-03-28 ENCOUNTER — Other Ambulatory Visit: Payer: Self-pay

## 2021-04-04 ENCOUNTER — Other Ambulatory Visit: Payer: Self-pay

## 2021-04-09 NOTE — Telephone Encounter (Signed)
Call placed to patient regarding CPAP settings.  she called back the next day with settings; however this CM never received a message about that. The patient said she would need to recheck the setting information and call this CM back.

## 2021-04-11 ENCOUNTER — Ambulatory Visit: Payer: Self-pay | Admitting: *Deleted

## 2021-04-11 NOTE — Telephone Encounter (Signed)
Patient states she is having cough, fever, congestion. Patient states her symptoms started Tuesday and she has had negative COVID test. Patient states she has not checked temperature- but is having chills and feels hot. Patient states it is hard for her to breath when she lays down- advised UC per protocol- patient has orange card- not sure she will go. Patient advised UC/ED for her symptoms.

## 2021-04-11 NOTE — Telephone Encounter (Signed)
Patient called in to say that since Tuesday 04/08/21 she started with symptoms of fever, chills, congestion, and a bad cough needing to be seen was scheduled for an appointment on 04/14/21. Please call patient she have concerns Ph# [phone]  Reason for Disposition  [1] MILD difficulty breathing (e.g., minimal/no SOB at rest, SOB with walking, pulse <100) AND [2] still present when not coughing  Answer Assessment - Initial Assessment Questions 1. ONSET: "When did the cough begin?"      Tuesday evening 2. SEVERITY: "How bad is the cough today?"      Wet heavy- chokes patient 3. SPUTUM: "Describe the color of your sputum" (none, dry cough; clear, white, yellow, green)     yellow 4. HEMOPTYSIS: "Are you coughing up any blood?" If so ask: "How much?" (flecks, streaks, tablespoons, etc.)     Tasted blood- but not seen 5. DIFFICULTY BREATHING: "Are you having difficulty breathing?" If Yes, ask: "How bad is it?" (e.g., mild, moderate, severe)    - MILD: No SOB at rest, mild SOB with walking, speaks normally in sentences, can lie down, no retractions, pulse < 100.    - MODERATE: SOB at rest, SOB with minimal exertion and prefers to sit, cannot lie down flat, speaks in phrases, mild retractions, audible wheezing, pulse 100-120.    - SEVERE: Very SOB at rest, speaks in single words, struggling to breathe, sitting hunched forward, retractions, pulse > 120      Last night was labored- hard to breath- today patient is using Mucinex and feels that it is some better. Deep breath is hard- painful and cough 6. FEVER: "Do you have a fever?" If Yes, ask: "What is your temperature, how was it measured, and when did it start?"     Yes- chills, not measuring 7. CARDIAC HISTORY: "Do you have any history of heart disease?" (e.g., heart attack, congestive heart failure)      no 8. LUNG HISTORY: "Do you have any history of lung disease?"  (e.g., pulmonary embolus, asthma, emphysema)     no 9. PE RISK FACTORS: "Do  you have a history of blood clots?" (or: recent major surgery, recent prolonged travel, bedridden)     no 10. OTHER SYMPTOMS: "Do you have any other symptoms?" (e.g., runny nose, wheezing, chest pain)       Glucose is higher 11. PREGNANCY: "Is there any chance you are pregnant?" "When was your last menstrual period?"       na 12. TRAVEL: "Have you traveled out of the country in the last month?" (e.g., travel history, exposures)       no  Protocols used: Cough - Acute Productive-A-AH

## 2021-04-14 ENCOUNTER — Ambulatory Visit: Payer: Medicaid Other | Attending: Physician Assistant | Admitting: Physician Assistant

## 2021-04-14 ENCOUNTER — Other Ambulatory Visit: Payer: Self-pay

## 2021-04-14 ENCOUNTER — Encounter: Payer: Self-pay | Admitting: Physician Assistant

## 2021-04-14 ENCOUNTER — Other Ambulatory Visit: Payer: Self-pay | Admitting: Endocrinology

## 2021-04-14 DIAGNOSIS — E1142 Type 2 diabetes mellitus with diabetic polyneuropathy: Secondary | ICD-10-CM

## 2021-04-14 DIAGNOSIS — R051 Acute cough: Secondary | ICD-10-CM

## 2021-04-14 DIAGNOSIS — J029 Acute pharyngitis, unspecified: Secondary | ICD-10-CM

## 2021-04-14 MED ORDER — ALBUTEROL SULFATE HFA 108 (90 BASE) MCG/ACT IN AERS
2.0000 | INHALATION_SPRAY | Freq: Four times a day (QID) | RESPIRATORY_TRACT | 0 refills | Status: DC | PRN
  Filled 2021-04-14: qty 8.5, 25d supply, fill #0

## 2021-04-14 MED ORDER — PREDNISONE 10 MG PO TABS
ORAL_TABLET | ORAL | 0 refills | Status: AC
Start: 2021-04-14 — End: 2021-04-22
  Filled 2021-04-14: qty 20, 8d supply, fill #0

## 2021-04-14 MED ORDER — BENZONATATE 100 MG PO CAPS
100.0000 mg | ORAL_CAPSULE | Freq: Two times a day (BID) | ORAL | 0 refills | Status: DC | PRN
  Filled 2021-04-14: qty 20, 10d supply, fill #0

## 2021-04-14 NOTE — Patient Instructions (Signed)
Continue taking the azithromycin as previously prescribed.  You were going to do a taper of prednisone, you will take 40 mg for 2 days, 30 mg the next 2 days, 20 mg the next 2 days and 10 mg the last 2 days.  I strongly encourage you to resume your [place], and be mindful of your carbohydrate intake while taking the prednisone.  Make sure that you continue to stay well-hydrated.  I also prescribed Tessalon Perles to help you with your cough and an albuterol inhaler to help you with your shortness of breath.  I hope that you feel better soon, please let us know if there is anything else we can do for you  Kennieth Rad, PA-C Physician Assistant [facility] [email or web address]  Upper Respiratory Infection, Adult An upper respiratory infection (URI) is a common viral infection of the nose, throat, and upper air passages that lead to the lungs. The most common type of URI is the common cold. URIs usually get better on their own, without medical treatment. What are the causes? A URI is caused by a virus. You may catch a virus by: Breathing in droplets from an infected person's cough or sneeze. Touching something that has been exposed to the virus (is contaminated) and then touching your mouth, nose, or eyes. What increases the risk? You are more likely to get a URI if: You are very young or very old. You have close contact with others, such as at work, school, or a health care facility. You smoke. You have long-term (chronic) heart or lung disease. You have a weakened disease-fighting system (immune system). You have nasal allergies or asthma. You are experiencing a lot of stress. You have poor nutrition. What are the signs or symptoms? A URI usually involves some of the following symptoms: Runny or stuffy (congested) nose. Cough. Sneezing. Sore throat. Headache. Fatigue. Fever. Loss of appetite. Pain in your forehead, behind  your eyes, and over your cheekbones (sinus pain). Muscle aches. Redness or irritation of the eyes. Pressure in the ears or face. How is this diagnosed? This condition may be diagnosed based on your medical history and symptoms, and a physical exam. Your health care provider may use a swab to take a mucus sample from your nose (nasal swab). This sample can be tested to determine what virus is causing the illness. How is this treated? URIs usually get better on their own within 7-10 days. Medicines cannot cure URIs, but your health care provider may recommend certain medicines to help relieve symptoms, such as: Over-the-counter cold medicines. Cough suppressants. Coughing is a type of defense against infection that helps to clear the respiratory system, so take these medicines only as recommended by your health care provider. Fever-reducing medicines. Follow these instructions at home: Activity Rest as needed. If you have a fever, stay home from work or school until your fever is gone or until your health care provider says your URI cannot spread to other people (is no longer contagious). Your health care provider may have you wear a face mask to prevent your infection from spreading. Relieving symptoms Gargle with a mixture of salt and water 3-4 times a day or as needed. To make salt water, completely dissolve -1 tsp (3-6 g) of salt in 1 cup (237 mL) of warm water. Use a cool-mist humidifier to add moisture to the air. This can help you breathe more easily. Eating and drinking  Drink enough fluid to keep your urine pale yellow. Eat  soups and other clear broths. General instructions  Take over-the-counter and prescription medicines only as told by your health care provider. These include cold medicines, fever reducers, and cough suppressants. Do not use any products that contain nicotine or tobacco. These products include cigarettes, chewing tobacco, and vaping devices, such as e-cigarettes.  If you need help quitting, ask your health care provider. Stay away from secondhand smoke. Stay up to date on all immunizations, including the yearly (annual) flu vaccine. Keep all follow-up visits. This is important. How to prevent the spread of infection to others URIs can be contagious. To prevent the infection from spreading: Wash your hands with soap and water for at least 20 seconds. If soap and water are not available, use hand sanitizer. Avoid touching your mouth, face, eyes, or nose. Cough or sneeze into a tissue or your sleeve or elbow instead of into your hand or into the air.  Contact a health care provider if: You are getting worse instead of better. You have a fever or chills. Your mucus is brown or red. You have yellow or brown discharge coming from your nose. You have pain in your face, especially when you bend forward. You have swollen neck glands. You have pain while swallowing. You have white areas in the back of your throat. Get help right away if: You have shortness of breath that gets worse. You have severe or persistent: Headache. Ear pain. Sinus pain. Chest pain. You have chronic lung disease along with any of the following: Making high-pitched whistling sounds when you breathe, most often when you breathe out (wheezing). Prolonged cough (more than 14 days). Coughing up blood. A change in your usual mucus. You have a stiff neck. You have changes in your: Vision. Hearing. Thinking. Mood. These symptoms may be an emergency. Get help right away. Call 911. Do not wait to see if the symptoms will go away. Do not drive yourself to the hospital. Summary An upper respiratory infection (URI) is a common infection of the nose, throat, and upper air passages that lead to the lungs. A URI is caused by a virus. URIs usually get better on their own within 7-10 days. Medicines cannot cure URIs, but your health care provider may recommend certain medicines to help  relieve symptoms. This information is not intended to replace advice given to you by your health care provider. Make sure you discuss any questions you have with your health care provider. Document Revised: 12/04/2020 Document Reviewed: 12/04/2020 Elsevier Patient Education  [place].

## 2021-04-14 NOTE — Telephone Encounter (Signed)
Patient had an appt today

## 2021-04-14 NOTE — Progress Notes (Signed)
Sx began last Tuesday with Wednesday being the worse day. Patient had N/V/Diarrhea last week with last episodes of vomiting being Saturday and diarrhea yesterday. Patient home test for C-19 was negative along with 2 symptomatic children. Patient is able to eat and drink. Patient has had fevers  at home.

## 2021-04-14 NOTE — Progress Notes (Signed)
Established Patient Office Visit  Subjective:  Patient ID: Teresa Camacho, female    DOB: [birth year]  Age: 55 y.o. MRN: [id number]  CC:  Chief Complaint  Patient presents with   URI   Virtual Visit via Telephone Note  I connected with [name] on 04/14/21 at  9:10 AM EST by telephone and verified that I am speaking with the correct person using two identifiers.  Location: Patient: Home   [facility]   I discussed the limitations, risks, security and privacy concerns of performing an evaluation and management service by telephone and the availability of in person appointments. I also discussed with the patient that there may be a patient responsible charge related to this service. The patient expressed understanding and agreed to proceed.   History of Present Illness: States that she started feeling poorly last Tuesday 11/22, has been having a dry cough with chest congestion but unable to cough anything up.  States that she has been having chills and fever, has been using an oximeter, O2 levels 88-90, 92-93 the last couple of days.  Green/yellow nasal discharge  States that she is having a hard time breathing when laying down.  States that she took a home COVID test after her symptoms began, negative.  States 2 children at home have similar symptoms  States that had a virtual visit 3 days ago and was prescribed a z-pack has taken it for the past three days.  States that she is also using mucinex and advil sinus. Steam showers.  States that she has not noticed any improvement.  Is eating and drinking okay.  States that she has been out of the [place] for the past week and her BG levels has been elevated due to this, states that when she is taking the [place] her blood glucose levels are approximately 150.  Does plan on picking up her [place] and restarting it today    Observations/Objective: Medical history and current medications reviewed, no  physical exam completed   Past Medical History:  Diagnosis Date   Anemia    Blood transfusion without reported diagnosis    Fibroids, submucosal 05/03/2013   GERD (gastroesophageal reflux disease)    Hyperlipidemia    Hypertension    PCOS (polycystic ovarian syndrome)    PONV (postoperative nausea and vomiting)    " super tight airway" as per pt   Sleep apnea    Type II diabetes mellitus ([place])     Past Surgical History:  Procedure Laterality Date   ABDOMINAL HYSTERECTOMY N/A 06/13/2013   Procedure: HYSTERECTOMY ABDOMINAL and removal of vaginal skin tag;  Surgeon: Luz Lex, MD;  Location: [facility];  Service: Gynecology;  Laterality: N/A;   APPENDECTOMY     BALLOON DILATION N/A 08/27/2020   Procedure: BALLOON DILATION;  Surgeon: Irene Shipper, MD;  Location: [facility];  Service: Endoscopy;  Laterality: N/A;   CESAREAN SECTION     3x   CHOLECYSTECTOMY     CHOLECYSTECTOMY  1991   COLONOSCOPY WITH PROPOFOL N/A 08/27/2020   Procedure: COLONOSCOPY WITH PROPOFOL;  Surgeon: Irene Shipper, MD;  Location: [facility];  Service: Endoscopy;  Laterality: N/A;   ESOPHAGOGASTRODUODENOSCOPY (EGD) WITH PROPOFOL N/A 08/27/2020   Procedure: ESOPHAGOGASTRODUODENOSCOPY (EGD) WITH PROPOFOL;  Surgeon: Irene Shipper, MD;  Location: [facility];  Service: Endoscopy;  Laterality: N/A;   POLYPECTOMY  08/27/2020   Procedure: POLYPECTOMY;  Surgeon: Irene Shipper, MD;  Location: [facility];  Service: Endoscopy;;    Family History  Problem Relation Age of Onset   Depression Mother    Diabetes Mother    Arthritis Mother    Heart disease Mother    Learning disabilities Mother    Mental illness Mother    Liver cancer Mother        metastatic from colon??   Arthritis Father    Prostate cancer Father        met to esophagus   Colon polyps Sister    Cancer Maternal Aunt        x 2 Aunts    Social History   Socioeconomic History   Marital status: Divorced    Spouse name: Not on file   Number  of children: 5   Years of education: Not on file   Highest education level: Not on file  Occupational History   Not on file  Tobacco Use   Smoking status: Never   Smokeless tobacco: Never  Vaping Use   Vaping Use: Never used  Substance and Sexual Activity   Alcohol use: No   Drug use: No   Sexual activity: Not Currently  Other Topics Concern   Not on file  Social History Narrative   Not on file   Social Determinants of Health   Financial Resource Strain: Not on file  Food Insecurity: Not on file  Transportation Needs: Not on file  Physical Activity: Not on file  Stress: Not on file  Social Connections: Not on file  Intimate Partner Violence: Not on file    Outpatient Medications Prior to Visit  Medication Sig Dispense Refill   acetaminophen (TYLENOL) 500 MG tablet Take 1,000 mg by mouth every 6 (six) hours as needed for mild pain.     aspirin EC 81 MG tablet Take 1 tablet (81 mg total) by mouth daily. Swallow whole. (Patient taking differently: Take 81 mg by mouth in the morning. Swallow whole.) 100 tablet 2   atorvastatin (LIPITOR) 80 MG tablet TAKE ONE TABLET BY MOUTH DAILY 90 tablet 1   blood glucose meter kit and supplies KIT Dispense based on patient and insurance preference. Use up to four times daily as directed. (FOR ICD-9 250.00, 250.01). 1 each 0   Blood Glucose Monitoring Suppl (TRUE METRIX METER) w/Device KIT Use as directed 1 kit 0   Cholecalciferol (VITAMIN D-3) 125 MCG (5000 UT) TABS Take 5,000 Units by mouth in the morning.     dapagliflozin propanediol (FARXIGA) 5 MG TABS tablet Take 1 tablet (5 mg total) by mouth daily. 90 tablet 3   desloratadine (CLARINEX) 5 MG tablet Take 5 mg by mouth at bedtime.     diphenhydrAMINE (BENADRYL) 25 mg capsule Take 50 mg by mouth at bedtime.     fenofibrate (TRICOR) 48 MG tablet TAKE ONE TABLET BY MOUTH DAILY 90 tablet 1   gabapentin (NEURONTIN) 300 MG capsule Take 2 capsules (600 mg total) by mouth at bedtime. 60 capsule  5   glucose blood (TRUE METRIX BLOOD GLUCOSE TEST) test strip USE AS INSTRUCTED 100 strip 12   Insulin NPH, Human,, Isophane, (HUMULIN N KWIKPEN) 100 UNIT/ML Kiwkpen Inject 130 Units into the skin every morning. 135 mL 3   Insulin Pen Needle 32G X 4 MM MISC 1 each by Does not apply route in the morning and at bedtime. 100 each 1   lisinopril (ZESTRIL) 10 MG tablet TAKE ONE TABLET BY MOUTH DAILY 90 tablet 1   loratadine (CLARITIN) 10 MG tablet Take  1 tablet (10 mg total) by mouth daily. 30 tablet 6   Melatonin 10 MG SUBL Place 20 mg under the tongue at bedtime.     metFORMIN (GLUCOPHAGE-XR) 500 MG 24 hr tablet Take 4 tablets (2,000 mg total) by mouth daily. 360 tablet 3   metoprolol tartrate (LOPRESSOR) 25 MG tablet TAKE ONE TABLET BY MOUTH TWICE A DAY 360 tablet 1   Multiple Vitamins-Minerals (MULTIVITAMIN WITH MINERALS) tablet Take 1 tablet by mouth daily.     Na Sulfate-K Sulfate-Mg Sulf 17.5-3.13-1.6 GM/177ML SOLN TAKE AS 1 TIME DOSE 354 mL 0   pantoprazole (PROTONIX) 40 MG tablet TAKE ONE TABLET BY MOUTH TWICE A DAY BEFORE A MEAL 180 tablet 1   Semaglutide,0.25 or 0.5MG/DOS, 2 MG/1.5ML SOPN Inject 0.25 mg into the skin once a week. 1.5 mL 3   traZODone (DESYREL) 100 MG tablet Take 100 mg by mouth at bedtime.     TRUEplus Lancets 28G MISC USE AS DIRECTED 100 each 4   No facility-administered medications prior to visit.    Allergies  Allergen Reactions   Wound Dressing Adhesive Other (See Comments)    Redness/irritation   Codeine Nausea And Vomiting    Pt is able to take percocet & vicodin   Latex Rash    irritation    ROS Review of Systems  Constitutional:  Positive for chills, fatigue and fever.  HENT:  Positive for congestion, postnasal drip and sinus pressure. Negative for facial swelling, sore throat and trouble swallowing.   Eyes: Negative.   Respiratory:  Positive for cough, chest tightness and shortness of breath.   Cardiovascular:  Negative for chest pain.   Gastrointestinal:  Negative for nausea and vomiting.  Endocrine: Negative.   Genitourinary: Negative.   Musculoskeletal: Negative.   Skin: Negative.   Allergic/Immunologic: Negative.   Neurological: Negative.   Hematological: Negative.   Psychiatric/Behavioral: Negative.       Objective:      LMP 04/17/2013  Wt Readings from Last 3 Encounters:  03/26/21 232 lb 3.2 oz (105.3 kg)  03/13/21 230 lb 3.2 oz (104.4 kg)  01/24/21 230 lb (104.3 kg)     Health Maintenance Due  Topic Date Due   Hepatitis C Screening  Never done   MAMMOGRAM  06/20/2016    There are no preventive care reminders to display for this patient.  Lab Results  Component Value Date   TSH 2.329 11/30/2019   Lab Results  Component Value Date   WBC 10.5 03/13/2021   HGB 15.1 03/13/2021   HCT 45.6 03/13/2021   MCV 84 03/13/2021   PLT 340 03/13/2021   Lab Results  Component Value Date   NA 140 03/13/2021   K 4.6 03/13/2021   CO2 18 (L) 03/13/2021   GLUCOSE 163 (H) 03/13/2021   BUN 13 03/13/2021   CREATININE 0.73 03/13/2021   BILITOT 0.9 03/13/2021   ALKPHOS 81 03/13/2021   AST 26 03/13/2021   ALT 36 (H) 03/13/2021   PROT 7.2 03/13/2021   ALBUMIN 4.7 03/13/2021   CALCIUM 10.4 (H) 03/13/2021   ANIONGAP 13 12/02/2019   EGFR 98 03/13/2021   Lab Results  Component Value Date   CHOL 87 (L) 03/13/2021   Lab Results  Component Value Date   HDL 33 (L) 03/13/2021   Lab Results  Component Value Date   LDLCALC 25 03/13/2021   Lab Results  Component Value Date   TRIG 178 (H) 03/13/2021   Lab Results  Component Value Date  CHOLHDL 2.6 03/13/2021   Lab Results  Component Value Date   HGBA1C 8.0 (A) 03/26/2021      Assessment & Plan:   Problem List Items Addressed This Visit   None Visit Diagnoses     Type 2 diabetes mellitus with peripheral neuropathy (HCC)    -  Primary   Acute cough       Relevant Medications   benzonatate (TESSALON) 100 MG capsule   Pharyngitis,  unspecified etiology       Relevant Medications   albuterol (VENTOLIN HFA) 108 (90 Base) MCG/ACT inhaler   predniSONE (DELTASONE) 10 MG tablet       Meds ordered this encounter  Medications   albuterol (VENTOLIN HFA) 108 (90 Base) MCG/ACT inhaler    Sig: Inhale 2 puffs into the lungs every 6 (six) hours as needed for wheezing or shortness of breath.    Dispense:  8 g    Refill:  0    Order Specific Question:   Supervising Provider    Answer:   Elsie Stain [[id number]]   predniSONE (DELTASONE) 10 MG tablet    Sig: Take 4 tablets (40 mg total) by mouth daily with breakfast for 2 days, THEN 3 tablets (30 mg total) daily with breakfast for 2 days, THEN 2 tablets (20 mg total) daily with breakfast for 2 days, THEN 1 tablet (10 mg total) daily with breakfast for 2 days.    Dispense:  20 tablet    Refill:  0    Order Specific Question:   Supervising Provider    Answer:   Elsie Stain [[id number]]   benzonatate (TESSALON) 100 MG capsule    Sig: Take 1 capsule (100 mg total) by mouth 2 (two) times daily as needed for cough.    Dispense:  20 capsule    Refill:  0    Order Specific Question:   Supervising Provider    Answer:   Elsie Stain [[id number]]   Assessment and Plan: 1. Pharyngitis, unspecified etiology Continue azithromycin as previously prescribed.  Trial prednisone taper, albuterol inhaler.  Patient strongly encouraged to resume [place] and be very mindful of carbohydrate intake due to possibility of prednisone increasing blood glucose levels.  Patient education given on supportive care, red flags for prompt reevaluation. - albuterol (VENTOLIN HFA) 108 (90 Base) MCG/ACT inhaler; Inhale 2 puffs into the lungs every 6 (six) hours as needed for wheezing or shortness of breath.  Dispense: 8.5 g; Refill: 0 - predniSONE (DELTASONE) 10 MG tablet; Take 4 tablets (40 mg total) by mouth daily with breakfast for 2 days, THEN 3 tablets (30 mg total) daily with breakfast for 2 days, THEN 2 tablets  (20 mg total) daily with breakfast for 2 days, THEN 1 tablet (10 mg total) daily with breakfast for 2 days.  Dispense: 20 tablet; Refill: 0  2. Acute cough Trial Tessalon Perles - benzonatate (TESSALON) 100 MG capsule; Take 1 capsule (100 mg total) by mouth 2 (two) times daily as needed for cough.  Dispense: 20 capsule; Refill: 0  3. Type 2 diabetes mellitus with peripheral neuropathy (HCC)    Follow Up Instructions:    I discussed the assessment and treatment plan with the patient. The patient was provided an opportunity to ask questions and all were answered. The patient agreed with the plan and demonstrated an understanding of the instructions.   The patient was advised to call back or seek an in-person evaluation if the symptoms worsen or if the condition  fails to improve as anticipated.  I provided 21 minutes of non-face-to-face time during this encounter.   Loraine Grip Mayers, PA-C

## 2021-04-15 ENCOUNTER — Other Ambulatory Visit: Payer: Self-pay

## 2021-04-15 MED ORDER — OZEMPIC (0.25 OR 0.5 MG/DOSE) 2 MG/1.5ML ~~LOC~~ SOPN
0.2500 mg | PEN_INJECTOR | SUBCUTANEOUS | 3 refills | Status: DC
  Filled 2021-04-15 – 2021-06-10 (×3): qty 1.5, 56d supply, fill #0

## 2021-04-16 ENCOUNTER — Other Ambulatory Visit: Payer: Self-pay

## 2021-04-17 ENCOUNTER — Other Ambulatory Visit: Payer: Self-pay

## 2021-04-29 ENCOUNTER — Other Ambulatory Visit: Payer: Self-pay

## 2021-04-30 ENCOUNTER — Other Ambulatory Visit: Payer: Self-pay

## 2021-05-07 ENCOUNTER — Other Ambulatory Visit: Payer: Self-pay

## 2021-05-08 ENCOUNTER — Telehealth: Payer: Self-pay

## 2021-05-08 NOTE — Telephone Encounter (Signed)
Call placed to patient to inquire if she has been able to determine the settings for her CPAP machine. She said she is not really sure and needs to check some more.  She explained that it " ramps up to 10" but she is not sure that is the exact setting. She said she will google the machine to see how she can best check the setting.   Her machine is still working but she is fearful it will stop working as life of the motor has " expired."  Instructed her to call this CM back if she is not able to determine the setting and our pulmonologist may be able to check it for her.

## 2021-05-09 ENCOUNTER — Other Ambulatory Visit: Payer: Self-pay

## 2021-05-13 ENCOUNTER — Other Ambulatory Visit: Payer: Self-pay

## 2021-05-14 ENCOUNTER — Other Ambulatory Visit: Payer: Self-pay

## 2021-05-19 ENCOUNTER — Other Ambulatory Visit: Payer: Self-pay | Admitting: Internal Medicine

## 2021-05-19 DIAGNOSIS — E785 Hyperlipidemia, unspecified: Secondary | ICD-10-CM

## 2021-05-19 DIAGNOSIS — I10 Essential (primary) hypertension: Secondary | ICD-10-CM

## 2021-05-19 DIAGNOSIS — E1169 Type 2 diabetes mellitus with other specified complication: Secondary | ICD-10-CM

## 2021-05-26 ENCOUNTER — Other Ambulatory Visit: Payer: Self-pay

## 2021-05-27 ENCOUNTER — Other Ambulatory Visit: Payer: Self-pay

## 2021-05-28 ENCOUNTER — Other Ambulatory Visit: Payer: Self-pay

## 2021-05-28 ENCOUNTER — Encounter: Payer: Self-pay | Admitting: Endocrinology

## 2021-05-28 ENCOUNTER — Ambulatory Visit (INDEPENDENT_AMBULATORY_CARE_PROVIDER_SITE_OTHER): Payer: Medicaid Other | Admitting: Endocrinology

## 2021-05-28 VITALS — BP 110/70 | HR 85 | Ht 61.0 in | Wt 228.8 lb

## 2021-05-28 DIAGNOSIS — E1165 Type 2 diabetes mellitus with hyperglycemia: Secondary | ICD-10-CM

## 2021-05-28 DIAGNOSIS — E1142 Type 2 diabetes mellitus with diabetic polyneuropathy: Secondary | ICD-10-CM

## 2021-05-28 DIAGNOSIS — Z794 Long term (current) use of insulin: Secondary | ICD-10-CM

## 2021-05-28 LAB — POCT GLYCOSYLATED HEMOGLOBIN (HGB A1C): Hemoglobin A1C: 8.9 % — AB (ref 4.0–5.6)

## 2021-05-28 MED ORDER — HUMULIN N KWIKPEN 100 UNIT/ML ~~LOC~~ SUPN
150.0000 [IU] | PEN_INJECTOR | SUBCUTANEOUS | 3 refills | Status: DC
Start: 2021-05-28 — End: 2021-10-06
  Filled 2021-05-28: qty 45, 30d supply, fill #0
  Filled 2021-05-28: qty 150, 100d supply, fill #0

## 2021-05-28 NOTE — Progress Notes (Signed)
Subjective:    Patient ID: Teresa Camacho, female    DOB: May 21, 1965, 56 y.o.   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   Interval history: she brings a record of her cbg's today.  cbg varies from 136-240.  It is still in general higher as the day goes on, but the trend is less now. She takes meds as rx'ed.  Past Medical History:  Diagnosis Date   Anemia    Blood transfusion without reported diagnosis    Fibroids, submucosal 05/03/2013   GERD (gastroesophageal reflux disease)    Hyperlipidemia    Hypertension    PCOS (polycystic ovarian syndrome)    PONV (postoperative nausea and vomiting)    " super tight airway" as per pt   Sleep apnea    Type II diabetes mellitus ([place])     Past Surgical History:  Procedure Laterality Date   ABDOMINAL HYSTERECTOMY N/A 06/13/2013   Procedure: HYSTERECTOMY ABDOMINAL and removal of vaginal skin tag;  Surgeon: Luz Lex, MD;  Location: [facility];  Service: Gynecology;  Laterality: N/A;   APPENDECTOMY     BALLOON DILATION N/A 08/27/2020   Procedure: BALLOON DILATION;  Surgeon: Irene Shipper, MD;  Location: [facility];  Service: Endoscopy;  Laterality: N/A;   CESAREAN SECTION     3x   CHOLECYSTECTOMY     CHOLECYSTECTOMY  1991   COLONOSCOPY WITH PROPOFOL N/A 08/27/2020   Procedure: COLONOSCOPY WITH PROPOFOL;  Surgeon: Irene Shipper, MD;  Location: [facility];  Service: Endoscopy;  Laterality: N/A;   ESOPHAGOGASTRODUODENOSCOPY (EGD) WITH PROPOFOL N/A 08/27/2020   Procedure: ESOPHAGOGASTRODUODENOSCOPY (EGD) WITH PROPOFOL;  Surgeon: Irene Shipper, MD;  Location: [facility];   Service: Endoscopy;  Laterality: N/A;   POLYPECTOMY  08/27/2020   Procedure: POLYPECTOMY;  Surgeon: Irene Shipper, MD;  Location: [facility];  Service: Endoscopy;;    Social History   Socioeconomic History   Marital status: Divorced    Spouse name: Not on file   Number of children: 5   Years of education: Not on file   Highest education level: Not on file  Occupational History   Not on file  Tobacco Use   Smoking status: Never   Smokeless tobacco: Never  Vaping Use   Vaping Use: Never used  Substance and Sexual Activity   Alcohol use: No   Drug use: No   Sexual activity: Not Currently  Other Topics Concern   Not on file  Social History Narrative   Not on file   Social Determinants of Health   Financial Resource Strain: Not on file  Food Insecurity: Not on file  Transportation Needs: Not on file  Physical Activity: Not on file  Stress: Not on file  Social Connections: Not on file  Intimate Partner Violence: Not on file    Current Outpatient Medications on File Prior to Visit  Medication Sig Dispense Refill   acetaminophen (TYLENOL) 500 MG tablet Take 1,000 mg by mouth every 6 (six) hours as needed for mild pain.     albuterol (VENTOLIN HFA) 108 (90 Base) MCG/ACT inhaler Inhale 2 puffs into the lungs every 6 (six) hours  as needed for wheezing or shortness of breath. 8.5 g 0   aspirin EC 81 MG tablet Take 1 tablet (81 mg total) by mouth daily. Swallow whole. (Patient taking differently: Take 81 mg by mouth in the morning. Swallow whole.) 100 tablet 2   atorvastatin (LIPITOR) 80 MG tablet TAKE ONE TABLET BY MOUTH DAILY 90 tablet 1   benzonatate (TESSALON) 100 MG capsule Take 1 capsule (100 mg total) by mouth 2 (two) times daily as needed for cough. 20 capsule 0   blood glucose meter kit and supplies KIT Dispense based on patient and insurance preference. Use up to four times daily as directed. (FOR ICD-9 250.00, 250.01). 1 each 0   Blood Glucose Monitoring Suppl (TRUE  METRIX METER) w/Device KIT Use as directed 1 kit 0   Cholecalciferol (VITAMIN D-3) 125 MCG (5000 UT) TABS Take 5,000 Units by mouth in the morning.     dapagliflozin propanediol (FARXIGA) 5 MG TABS tablet Take 1 tablet (5 mg total) by mouth daily. 90 tablet 3   desloratadine (CLARINEX) 5 MG tablet Take 5 mg by mouth at bedtime.     diphenhydrAMINE (BENADRYL) 25 mg capsule Take 50 mg by mouth at bedtime.     fenofibrate (TRICOR) 48 MG tablet TAKE ONE TABLET BY MOUTH DAILY 90 tablet 1   gabapentin (NEURONTIN) 300 MG capsule Take 2 capsules (600 mg total) by mouth at bedtime. 60 capsule 5   glucose blood (TRUE METRIX BLOOD GLUCOSE TEST) test strip USE AS INSTRUCTED 100 strip 12   Insulin Pen Needle 32G X 4 MM MISC 1 each by Does not apply route in the morning and at bedtime. 100 each 1   lisinopril (ZESTRIL) 10 MG tablet TAKE ONE TABLET BY MOUTH DAILY 90 tablet 1   loratadine (CLARITIN) 10 MG tablet Take 1 tablet (10 mg total) by mouth daily. 30 tablet 6   Melatonin 10 MG SUBL Place 20 mg under the tongue at bedtime.     metFORMIN (GLUCOPHAGE-XR) 500 MG 24 hr tablet Take 4 tablets (2,000 mg total) by mouth daily. 360 tablet 3   metoprolol tartrate (LOPRESSOR) 25 MG tablet TAKE ONE TABLET BY MOUTH TWICE A DAY 360 tablet 1   Multiple Vitamins-Minerals (MULTIVITAMIN WITH MINERALS) tablet Take 1 tablet by mouth daily.     Na Sulfate-K Sulfate-Mg Sulf 17.5-3.13-1.6 GM/177ML SOLN TAKE AS 1 TIME DOSE 354 mL 0   pantoprazole (PROTONIX) 40 MG tablet TAKE ONE TABLET BY MOUTH TWICE A DAY BEFORE A MEAL 180 tablet 1   Semaglutide,0.25 or 0.5MG/DOS, (OZEMPIC, 0.25 OR 0.5 MG/DOSE,) 2 MG/1.5ML SOPN Inject 0.25 mg into the skin once a week. 1.5 mL 3   traZODone (DESYREL) 100 MG tablet Take 100 mg by mouth at bedtime.     TRUEplus Lancets 28G MISC USE AS DIRECTED 100 each 4   No current facility-administered medications on file prior to visit.    Allergies  Allergen Reactions   Wound Dressing Adhesive Other  (See Comments)    Redness/irritation   Codeine Nausea And Vomiting    Pt is able to take percocet & vicodin   Latex Rash    irritation    Family History  Problem Relation Age of Onset   Depression Mother    Diabetes Mother    Arthritis Mother    Heart disease Mother    Learning disabilities Mother    Mental illness Mother    Liver cancer Mother        metastatic from  colon??   Arthritis Father    Prostate cancer Father        met to esophagus   Colon polyps Sister    Cancer Maternal Aunt        x 2 Aunts    BP 110/70 (BP Location: Left Arm, Patient Position: Sitting, Cuff Size: Normal)    Pulse 85    Ht 5' 1"  (1.549 m)    Wt 228 lb 12.8 oz (103.8 kg)    LMP 04/17/2013    SpO2 97%    BMI 43.23 kg/m    Review of Systems She denies hypoglycemia     Objective:   Physical Exam    Lab Results  Component Value Date   HGBA1C 8.9 (A) 05/28/2021      Assessment & Plan:  Insulin-requiring type 2 DM: uncontrolled  Patient Instructions  check your blood sugar twice a day.  vary the time of day when you check, between before the 3 meals, and at bedtime.  also check if you have symptoms of your blood sugar being too high or too low.  please keep a record of the readings and bring it to your next appointment here (or you can bring the meter itself).  You can write it on any piece of paper.  please call us sooner if your blood sugar goes below 70, or if most of your readings are over 200.    I have sent a prescription to your pharmacy, to increase the NPH insulin to 150 units each morning  Please continue the same other diabetes medications.   Please come back for a follow-up appointment in 2 months.

## 2021-05-28 NOTE — Patient Instructions (Addendum)
check your blood sugar twice a day.  vary the time of day when you check, between before the 3 meals, and at bedtime.  also check if you have symptoms of your blood sugar being too high or too low.  please keep a record of the readings and bring it to your next appointment here (or you can bring the meter itself).  You can write it on any piece of paper.  please call us sooner if your blood sugar goes below 70, or if most of your readings are over 200.    I have sent a prescription to your pharmacy, to increase the NPH insulin to 150 units each morning  Please continue the same other diabetes medications.   Please come back for a follow-up appointment in 2 months.

## 2021-06-04 ENCOUNTER — Other Ambulatory Visit: Payer: Self-pay

## 2021-06-06 ENCOUNTER — Telehealth: Payer: Self-pay | Admitting: Internal Medicine

## 2021-06-06 ENCOUNTER — Other Ambulatory Visit: Payer: Self-pay

## 2021-06-06 DIAGNOSIS — E1142 Type 2 diabetes mellitus with diabetic polyneuropathy: Secondary | ICD-10-CM

## 2021-06-06 MED ORDER — TRUE METRIX METER W/DEVICE KIT
PACK | 0 refills | Status: DC
  Filled 2021-06-06: qty 1, 30d supply, fill #0

## 2021-06-06 MED ORDER — TRUEPLUS LANCETS 28G MISC
3 refills | Status: DC
  Filled 2021-06-06: qty 100, 33d supply, fill #0
  Filled 2021-08-04: qty 100, 33d supply, fill #1
  Filled 2021-10-16: qty 100, 33d supply, fill #2
  Filled 2021-12-31: qty 100, 33d supply, fill #3

## 2021-06-06 MED ORDER — TRUE METRIX BLOOD GLUCOSE TEST VI STRP
ORAL_STRIP | 3 refills | Status: DC
  Filled 2021-06-06: qty 100, 33d supply, fill #0
  Filled 2021-08-04: qty 100, 33d supply, fill #1
  Filled 2021-10-30: qty 100, 33d supply, fill #2
  Filled 2022-01-15: qty 100, 33d supply, fill #3

## 2021-06-06 NOTE — Telephone Encounter (Signed)
Pt states Dr Wynetta Emery is going to order a cpap for her, but needed the settings. Pt states the setting is 10.

## 2021-06-06 NOTE — Telephone Encounter (Signed)
Pt needs a new glucometer.  Pt states hers is all over the place and giving crazy readings which make no sense.  Pt has a Blood Glucose Monitoring Suppl (TRUE METRIX METER) w/Device KIT Now, but whatever her insurance covers is OK.

## 2021-06-06 NOTE — Telephone Encounter (Signed)
Rx sent for Accu Chek Guide.

## 2021-06-10 ENCOUNTER — Other Ambulatory Visit: Payer: Self-pay

## 2021-06-22 ENCOUNTER — Other Ambulatory Visit: Payer: Self-pay | Admitting: Internal Medicine

## 2021-06-22 DIAGNOSIS — K219 Gastro-esophageal reflux disease without esophagitis: Secondary | ICD-10-CM

## 2021-06-23 ENCOUNTER — Ambulatory Visit: Payer: Self-pay | Admitting: *Deleted

## 2021-06-23 NOTE — Telephone Encounter (Signed)
Reason for Disposition  [1] Fever > 100.0 F (37.8 C) AND [2] weak immune system (e.g., HIV positive, cancer chemo, splenectomy, organ transplant, chronic steroids)    Has diabetes.  Vomiting can't keep anything down.  Wants to know what she can take along with her prescription medications.  Answer Assessment - Initial Assessment Questions 1. VOMITING SEVERITY: "How many times have you vomited in the past 24 hours?"     - MILD:  1 - 2 times/day    - MODERATE: 3 - 5 times/day, decreased oral intake without significant weight loss or symptoms of dehydration    - SEVERE: 6 or more times/day, vomits everything or nearly everything, with significant weight loss, symptoms of dehydration      Pt c/o vomiting, diarrhea, body aches, chills.  Not keeping anything down.    2. ONSET: "When did the vomiting begin?"      Last night  Glucose 197.    Uses insulin    3. FLUIDS: "What fluids or food have you vomited up today?" "Have you been able to keep any fluids down?"     Can't keep anything down 4. ABDOMINAL PAIN: "Are your having any abdominal pain?" If yes : "How bad is it and what does it feel like?" (e.g., crampy, dull, intermittent, constant)      Yes 5. DIARRHEA: "Is there any diarrhea?" If Yes, ask: "How many times today?"      Yes 6. CONTACTS: "Is there anyone else in the family with the same symptoms?"      Friday evening my daughter was sick with same symptoms.   7. CAUSE: "What do you think is causing your vomiting?"     Exposure to daughter 8. HYDRATION STATUS: "Any signs of dehydration?" (e.g., dry mouth [not only dry lips], too weak to stand) "When did you last urinate?"     I'm not urinating as much as usual.    Mouth very dry. 9. OTHER SYMPTOMS: "Do you have any other symptoms?" (e.g., fever, headache, vertigo, vomiting blood or coffee grounds, recent head injury)     Chills, body aches   Fever, I have a runny nose.     10. PREGNANCY: "Is there any chance you are pregnant?" "When was  your last menstrual period?"       N/A  Protocols used: Vomiting-A-AH  Chief Complaint: vomiting, can't keep anything down.   Has diabetes and wants to know what she can take that is compatible with her prescription medications. Symptoms: Vomiting is main symptom, diarrhea, body aches, chills fever Frequency: Started last night.   Pertinent Negatives: Patient denies dizziness.  Disposition: [] ED /[] Urgent Care (no appt availability in office) / [] Appointment(In office/virtual)/ []  [facility]/ [] Home Care/ [] Refused Recommended Disposition /[] [facility]/ [x]  Follow-up with PCP Additional Notes: There are no virtual visits available at [facility].   I'm sending a Teams message to [facility] regarding an appt virtually.  I returned pt's call.  She called in c/o vomiting, diarrhea, body aches and chills.   She has diabetes and was wanting to know what she could take for the vomiting that is compatible with all her medications.

## 2021-06-30 ENCOUNTER — Other Ambulatory Visit: Payer: Self-pay

## 2021-07-02 ENCOUNTER — Other Ambulatory Visit: Payer: Self-pay

## 2021-07-03 ENCOUNTER — Other Ambulatory Visit: Payer: Self-pay

## 2021-07-03 ENCOUNTER — Other Ambulatory Visit: Payer: Self-pay | Admitting: Internal Medicine

## 2021-07-03 DIAGNOSIS — E1165 Type 2 diabetes mellitus with hyperglycemia: Secondary | ICD-10-CM

## 2021-07-03 MED ORDER — INSULIN PEN NEEDLE 32G X 4 MM MISC
1.0000 | Freq: Two times a day (BID) | 1 refills | Status: DC
  Filled 2021-07-03: qty 100, 33d supply, fill #0
  Filled 2021-08-04: qty 100, 50d supply, fill #0
  Filled 2021-09-30: qty 100, 30d supply, fill #0

## 2021-07-14 ENCOUNTER — Ambulatory Visit: Payer: Medicaid Other | Attending: Internal Medicine | Admitting: Internal Medicine

## 2021-07-14 ENCOUNTER — Telehealth: Payer: Self-pay

## 2021-07-14 ENCOUNTER — Other Ambulatory Visit: Payer: Self-pay

## 2021-07-14 DIAGNOSIS — E1142 Type 2 diabetes mellitus with diabetic polyneuropathy: Secondary | ICD-10-CM

## 2021-07-14 DIAGNOSIS — G4733 Obstructive sleep apnea (adult) (pediatric): Secondary | ICD-10-CM

## 2021-07-14 DIAGNOSIS — E1169 Type 2 diabetes mellitus with other specified complication: Secondary | ICD-10-CM

## 2021-07-14 DIAGNOSIS — E785 Hyperlipidemia, unspecified: Secondary | ICD-10-CM

## 2021-07-14 DIAGNOSIS — I10 Essential (primary) hypertension: Secondary | ICD-10-CM

## 2021-07-14 DIAGNOSIS — Z9989 Dependence on other enabling machines and devices: Secondary | ICD-10-CM

## 2021-07-14 DIAGNOSIS — Z1231 Encounter for screening mammogram for malignant neoplasm of breast: Secondary | ICD-10-CM

## 2021-07-14 NOTE — Progress Notes (Signed)
Patient ID: Teresa Camacho, female   DOB: 1965/11/15, 56 y.o.   MRN: [id number] Virtual Visit via Telephone Note  I connected with Teresa Camacho on 07/14/2021 at 9:12 AM by telephone and verified that I am speaking with the correct person using two identifiers  Location: Patient: home Provider: home  Participants: Myself Patient   I discussed the limitations, risks, security and privacy concerns of performing an evaluation and management service by telephone and the availability of in person appointments. I also discussed with the patient that there may be a patient responsible charge related to this service. The patient expressed understanding and agreed to proceed.   History of Present Illness: Patient with history of DM type II with microalbumin, HTN, HL, OSA on CPAP, GERD,PCOS, pattern baldness.  Last seen 02/2021.  This is for chronic ds management.  HYPERTENSION Currently taking: see medication list Med Adherence: [x]  Yes -Lisinopril and metoprolol   []  No Medication side effects: []  Yes    [x]  No Adherence with salt restriction: [x]  Yes    []  No Home Monitoring?: []  Yes    [x]  No - no device Monitoring Frequency: BP was 110/70 when she saw endo 05/28/21 Home BP results range:  SOB? []  Yes    [x]  No Chest Pain?: []  Yes    [x]  No Leg swelling?: []  Yes    [x]  No Headaches?: []  Yes    [x]  No Dizziness? []  Yes    [x]  No Comments:   HL:  last LDL was 25.  Compliant with Tricor and Lipitor  OSA on CPAP: On last visit patient had reported that her CPAP machine was old and she felt that it would give out on her.  She had it for 5 years.  I referred her to our caseworker who is working with her to try to get a new device.  Patient states she did call the caseworker back with the settings of her current machine but has not heard since then.  She continues to use her current CPAP every night.  DM: Lab Results  Component Value Date   HGBA1C 8.9 (A) 05/28/2021   Saw Dr. Loanne Drilling 05/28/21.   NPH increased to 150 units a day.  Taking [place] as prescribed. Checking BS before BF and 2 hrs after dinner.  Before BF range 140-180 and bedtime about the same.  Did not tolerate higher dose of Ozempic Does well with eating habits Walks 45 mins 3x/wk  HM:  MMG ordered on last visit.  Pt states she did not receive form for MMG scholarship  Observations/Objective: Results for orders placed or performed in visit on 05/28/21  POCT HgB A1C  Result Value Ref Range   Hemoglobin A1C 8.9 (A) 4.0 - 5.6 %   HbA1c POC (<> result, manual entry)     HbA1c, POC (prediabetic range)     HbA1c, POC (controlled diabetic range)     Lab Results  Component Value Date   WBC 10.5 03/13/2021   HGB 15.1 03/13/2021   HCT 45.6 03/13/2021   MCV 84 03/13/2021   PLT 340 03/13/2021     Chemistry      Component Value Date/Time   NA 140 03/13/2021 1108   K 4.6 03/13/2021 1108   CL 103 03/13/2021 1108   CO2 18 (L) 03/13/2021 1108   BUN 13 03/13/2021 1108   CREATININE 0.73 03/13/2021 1108      Component Value Date/Time   CALCIUM 10.4 (H) 03/13/2021 1108  ALKPHOS 81 03/13/2021 1108   AST 26 03/13/2021 1108   ALT 36 (H) 03/13/2021 1108   BILITOT 0.9 03/13/2021 1108     Lab Results  Component Value Date   CHOL 87 (L) 03/13/2021   HDL 33 (L) 03/13/2021   LDLCALC 25 03/13/2021   LDLDIRECT 95.5 12/02/2019   TRIG 178 (H) 03/13/2021   CHOLHDL 2.6 03/13/2021     Assessment and Plan: 1. Type 2 diabetes mellitus with peripheral neuropathy (HCC) Current fasting blood sugars in the mornings not where they need to be.  She is being managed by endocrinology.  Encouraged her to continue her current medications, healthy eating habits and regular exercise.  2. Essential hypertension Last blood pressure recorded in the system from last month was at goal.  She will continue lisinopril and metoprolol.  3. Hyperlipidemia associated with type 2 diabetes mellitus (HCC) LDL at goal.  Continue  atorvastatin and Tricor.  4. OSA on CPAP Encouraged her to continue using her CPAP.  Message sent to case worker to inquire on progress of getting new device.  5. Encounter for screening mammogram for malignant neoplasm of breast Advised patient to come by the office and get mammogram scholarship form.   Follow Up Instructions: 4 mths   I discussed the assessment and treatment plan with the patient. The patient was provided an opportunity to ask questions and all were answered. The patient agreed with the plan and demonstrated an understanding of the instructions.   The patient was advised to call back or seek an in-person evaluation if the symptoms worsen or if the condition fails to improve as anticipated.  I  Spent 13 minutes on this telephone encounter  This note has been created with Surveyor, quantity. Any transcriptional errors are unintentional.  Karle Plumber, MD

## 2021-07-14 NOTE — Telephone Encounter (Addendum)
Call placed to patient regarding CPAP setting. She explained that she had called the information in to [facility]. Informed her that  this CM never received it. She stated that the setting is 10. Application for CPAP assistance program completed and waiting Dr Durenda Age signature.   The patient also requested the application for mammography scholarship program be mailed to her and the information was sent to her as requested.

## 2021-07-15 NOTE — Telephone Encounter (Signed)
Order for CPAP machine faxed to [employer] - CPAP assistance program.

## 2021-07-21 ENCOUNTER — Other Ambulatory Visit: Payer: Self-pay

## 2021-07-30 ENCOUNTER — Ambulatory Visit (INDEPENDENT_AMBULATORY_CARE_PROVIDER_SITE_OTHER): Payer: Medicaid Other | Admitting: Endocrinology

## 2021-07-30 ENCOUNTER — Encounter: Payer: Self-pay | Admitting: Endocrinology

## 2021-07-30 ENCOUNTER — Other Ambulatory Visit: Payer: Self-pay

## 2021-07-30 ENCOUNTER — Other Ambulatory Visit (HOSPITAL_COMMUNITY): Payer: Self-pay

## 2021-07-30 VITALS — BP 100/70 | HR 93 | Ht 61.0 in | Wt 228.8 lb

## 2021-07-30 DIAGNOSIS — E1165 Type 2 diabetes mellitus with hyperglycemia: Secondary | ICD-10-CM

## 2021-07-30 DIAGNOSIS — Z794 Long term (current) use of insulin: Secondary | ICD-10-CM

## 2021-07-30 DIAGNOSIS — E1142 Type 2 diabetes mellitus with diabetic polyneuropathy: Secondary | ICD-10-CM

## 2021-07-30 LAB — POCT GLYCOSYLATED HEMOGLOBIN (HGB A1C): Hemoglobin A1C: 7.3 % — AB (ref 4.0–5.6)

## 2021-07-30 MED ORDER — OZEMPIC (0.25 OR 0.5 MG/DOSE) 2 MG/1.5ML ~~LOC~~ SOPN
0.2500 mg | PEN_INJECTOR | SUBCUTANEOUS | 3 refills | Status: DC
Start: 2021-07-30 — End: 2022-01-28
  Filled 2021-08-05 – 2021-08-06 (×2): qty 1.5, 56d supply, fill #0

## 2021-07-30 NOTE — Patient Instructions (Addendum)
check your blood sugar twice a day.  vary the time of day when you check, between before the 3 meals, and at bedtime.  also check if you have symptoms of your blood sugar being too high or too low.  please keep a record of the readings and bring it to your next appointment here (or you can bring the meter itself).  You can write it on any piece of paper.  please call us sooner if your blood sugar goes below 70, or if most of your readings are over 200.    ?Please continue the same NPH insulin and other diabetes medications.   ?Please come back for a follow-up appointment in 2 months.   ?

## 2021-07-30 NOTE — Progress Notes (Signed)
? ?Subjective:  ? ? Patient ID: Teresa Camacho, female    DOB: 1966/04/25, 56 y.o.   MRN: [id number] ? ?HPI ?Pt returns for f/u of diabetes mellitus:  ?DM type: Insulin-requiring type 2 ?Dx'ed: 2021 ?Complications: none ?Therapy: insulin since soon after dx, Ozempic, and 2 oral meds.   ?GDM: never ?DKA: never ?Severe hypoglycemia: never ?Pancreatitis: never ?Pancreatic imaging: normal on 2015 CT ?SDOH: she has "[place] card." ?Other: she declines multiple daily injections; Ozempic dosage is limited by GI sxs.   ?Interval history: she brings a record of her cbg's today.  cbg varies from 123-213.  It is still in general higher as the day goes on, but the trend is less now. She takes meds as rx'ed.   ?Past Medical History:  ?Diagnosis Date  ? Anemia   ? Blood transfusion without reported diagnosis   ? Fibroids, submucosal 05/03/2013  ? GERD (gastroesophageal reflux disease)   ? Hyperlipidemia   ? Hypertension   ? PCOS (polycystic ovarian syndrome)   ? PONV (postoperative nausea and vomiting)   ? " super tight airway" as per pt  ? Sleep apnea   ? Type II diabetes mellitus ([place])   ? ? ?Past Surgical History:  ?Procedure Laterality Date  ? ABDOMINAL HYSTERECTOMY N/A 06/13/2013  ? Procedure: HYSTERECTOMY ABDOMINAL and removal of vaginal skin tag;  Surgeon: Luz Lex, MD;  Location: [facility];  Service: Gynecology;  Laterality: N/A;  ? APPENDECTOMY    ? BALLOON DILATION N/A 08/27/2020  ? Procedure: BALLOON DILATION;  Surgeon: Irene Shipper, MD;  Location: [facility];  Service: Endoscopy;  Laterality: N/A;  ? CESAREAN SECTION    ? 3x  ? CHOLECYSTECTOMY    ? CHOLECYSTECTOMY  1991  ? COLONOSCOPY WITH PROPOFOL N/A 08/27/2020  ? Procedure: COLONOSCOPY WITH PROPOFOL;  Surgeon: Irene Shipper, MD;  Location: [facility];  Service: Endoscopy;  Laterality: N/A;  ? ESOPHAGOGASTRODUODENOSCOPY (EGD) WITH PROPOFOL N/A 08/27/2020  ? Procedure: ESOPHAGOGASTRODUODENOSCOPY (EGD) WITH PROPOFOL;  Surgeon: Irene Shipper, MD;  Location: [facility];   Service: Endoscopy;  Laterality: N/A;  ? POLYPECTOMY  08/27/2020  ? Procedure: POLYPECTOMY;  Surgeon: Irene Shipper, MD;  Location: [facility];  Service: Endoscopy;;  ? ? ?Social History  ? ?Socioeconomic History  ? Marital status: Divorced  ?  Spouse name: Not on file  ? Number of children: 5  ? Years of education: Not on file  ? Highest education level: Not on file  ?Occupational History  ? Not on file  ?Tobacco Use  ? Smoking status: Never  ? Smokeless tobacco: Never  ?Vaping Use  ? Vaping Use: Never used  ?Substance and Sexual Activity  ? Alcohol use: No  ? Drug use: No  ? Sexual activity: Not Currently  ?Other Topics Concern  ? Not on file  ?Social History Narrative  ? Not on file  ? ?Social Determinants of Health  ? ?Financial Resource Strain: Not on file  ?Food Insecurity: Not on file  ?Transportation Needs: Not on file  ?Physical Activity: Not on file  ?Stress: Not on file  ?Social Connections: Not on file  ?Intimate Partner Violence: Not on file  ? ? ?Current Outpatient Medications on File Prior to Visit  ?Medication Sig Dispense Refill  ? acetaminophen (TYLENOL) 500 MG tablet Take 1,000 mg by mouth every 6 (six) hours as needed for mild pain.    ? albuterol (VENTOLIN HFA) 108 (90 Base) MCG/ACT inhaler Inhale 2 puffs into the lungs every  6 (six) hours as needed for wheezing or shortness of breath. 8.5 g 0  ? aspirin EC 81 MG tablet Take 1 tablet (81 mg total) by mouth daily. Swallow whole. (Patient taking differently: Take 81 mg by mouth in the morning. Swallow whole.) 100 tablet 2  ? atorvastatin (LIPITOR) 80 MG tablet TAKE ONE TABLET BY MOUTH DAILY 90 tablet 1  ? benzonatate (TESSALON) 100 MG capsule Take 1 capsule (100 mg total) by mouth 2 (two) times daily as needed for cough. 20 capsule 0  ? blood glucose meter kit and supplies KIT Dispense based on patient and insurance preference. Use up to four times daily as directed. (FOR ICD-9 250.00, 250.01). 1 each 0  ? Blood Glucose Monitoring Suppl (TRUE  METRIX METER) w/Device KIT use kit to check blood glucose three times daily 1 kit 0  ? Cholecalciferol (VITAMIN D-3) 125 MCG (5000 UT) TABS Take 5,000 Units by mouth in the morning.    ? dapagliflozin propanediol (FARXIGA) 5 MG TABS tablet Take 1 tablet (5 mg total) by mouth daily. 90 tablet 3  ? desloratadine (CLARINEX) 5 MG tablet Take 5 mg by mouth at bedtime.    ? diphenhydrAMINE (BENADRYL) 25 mg capsule Take 50 mg by mouth at bedtime.    ? fenofibrate (TRICOR) 48 MG tablet TAKE ONE TABLET BY MOUTH DAILY 90 tablet 1  ? gabapentin (NEURONTIN) 300 MG capsule Take 2 capsules (600 mg total) by mouth at bedtime. 60 capsule 5  ? glucose blood (TRUE METRIX BLOOD GLUCOSE TEST) test strip use one test strip to check blood glucose three times a day 100 strip 3  ? Insulin NPH, Human,, Isophane, (HUMULIN N KWIKPEN) 100 UNIT/ML Kiwkpen Inject 150 Units into the skin every morning. 150 mL 3  ? Insulin Pen Needle 32G X 4 MM MISC 1 each by Does not apply route in the morning and at bedtime. 100 each 1  ? lisinopril (ZESTRIL) 10 MG tablet TAKE ONE TABLET BY MOUTH DAILY 90 tablet 1  ? loratadine (CLARITIN) 10 MG tablet Take 1 tablet (10 mg total) by mouth daily. 30 tablet 6  ? Melatonin 10 MG SUBL Place 20 mg under the tongue at bedtime.    ? metFORMIN (GLUCOPHAGE-XR) 500 MG 24 hr tablet Take 4 tablets (2,000 mg total) by mouth daily. 360 tablet 3  ? metoprolol tartrate (LOPRESSOR) 25 MG tablet TAKE ONE TABLET BY MOUTH TWICE A DAY 360 tablet 1  ? Multiple Vitamins-Minerals (MULTIVITAMIN WITH MINERALS) tablet Take 1 tablet by mouth daily.    ? Na Sulfate-K Sulfate-Mg Sulf 17.5-3.13-1.6 GM/177ML SOLN TAKE AS 1 TIME DOSE 354 mL 0  ? pantoprazole (PROTONIX) 40 MG tablet TAKE 1 TABLET BY MOUTH TWO TIMES A DAY BEFORE A MEAL 180 tablet 1  ? traZODone (DESYREL) 100 MG tablet Take 100 mg by mouth at bedtime.    ? TRUEplus Lancets 28G MISC use lancet to check blood glucose three times daily 100 each 3  ? ?No current facility-administered  medications on file prior to visit.  ? ? ?Allergies  ?Allergen Reactions  ? Wound Dressing Adhesive Other (See Comments)  ?  Redness/irritation  ? Codeine Nausea And Vomiting  ?  Pt is able to take percocet & vicodin  ? Latex Rash  ?  irritation  ? ? ?Family History  ?Problem Relation Age of Onset  ? Depression Mother   ? Diabetes Mother   ? Arthritis Mother   ? Heart disease Mother   ? Learning  disabilities Mother   ? Mental illness Mother   ? Liver cancer Mother   ?     metastatic from colon??  ? Arthritis Father   ? Prostate cancer Father   ?     met to esophagus  ? Colon polyps Sister   ? Cancer Maternal Aunt   ?     x 2 Aunts  ? ? ?BP 100/70 (BP Location: Left Arm, Patient Position: Sitting, Cuff Size: Normal)   Pulse 93   Ht _0  (1.549 m)   Wt 228 lb 12.8 oz (103.8 kg)   LMP 04/17/2013   SpO2 97%   BMI 43.23 kg/m?  ? ? ?Review of Systems ?Denies N/V/HB. She denies hypoglycemia.  No weight change.   ?   ?Objective:  ? Physical Exam ?VITAL SIGNS:  See vs page ?GENERAL: no distress ? ? ? ?Lab Results  ?Component Value Date  ? HGBA1C 7.3 (A) 07/30/2021  ? ?   ?Assessment & Plan:  ?Insulin-requiring type 2 DM: this is the best control this pt should aim for, given this regimen, which does match insulin to her changing needs throughout the day ? ?Patient Instructions  ?check your blood sugar twice a day.  vary the time of day when you check, between before the 3 meals, and at bedtime.  also check if you have symptoms of your blood sugar being too high or too low.  please keep a record of the readings and bring it to your next appointment here (or you can bring the meter itself).  You can write it on any piece of paper.  please call us sooner if your blood sugar goes below 70, or if most of your readings are over 200.    ?Please continue the same NPH insulin and other diabetes medications.   ?Please come back for a follow-up appointment in 2 months.   ? ? ?

## 2021-08-04 ENCOUNTER — Other Ambulatory Visit: Payer: Self-pay

## 2021-08-05 ENCOUNTER — Other Ambulatory Visit (HOSPITAL_COMMUNITY): Payer: Self-pay

## 2021-08-05 ENCOUNTER — Other Ambulatory Visit: Payer: Self-pay

## 2021-08-06 ENCOUNTER — Other Ambulatory Visit: Payer: Self-pay

## 2021-08-06 ENCOUNTER — Telehealth: Payer: Self-pay | Admitting: Pharmacy Technician

## 2021-08-06 ENCOUNTER — Other Ambulatory Visit (HOSPITAL_COMMUNITY): Payer: Self-pay

## 2021-08-06 NOTE — Telephone Encounter (Signed)
Patient Advocate Encounter ? ?Received notification from [place] regarding a prior authorization for [facility] 0.'25MG'$  was denied due to Plan/Benefit Exclusion. ? ?PT HAS FAMILY PLANNING MEDICAID AND THEY ONLY COVER STI MEDICATIONS.  ? ? ?Received notification from [place] that prior authorization for [facility] 0.'25MG'$  is required. ?  ?PA submitted on 3.22.23 ?CMM KEY:  [id number] ?NCTRACKS C# [id number] W ?Status is pending ?  ? Clinic will continue to follow ? ?Rileigh Kawashima R Lathan Gieselman, CPhT ?Patient Advocate ?[facility] ?Phone: [phone] ?Fax:  [phone] ? ?

## 2021-08-13 ENCOUNTER — Other Ambulatory Visit: Payer: Self-pay | Admitting: Endocrinology

## 2021-08-18 ENCOUNTER — Other Ambulatory Visit: Payer: Self-pay

## 2021-08-25 ENCOUNTER — Other Ambulatory Visit: Payer: Self-pay

## 2021-09-23 ENCOUNTER — Other Ambulatory Visit: Payer: Self-pay | Admitting: Internal Medicine

## 2021-09-23 DIAGNOSIS — E1169 Type 2 diabetes mellitus with other specified complication: Secondary | ICD-10-CM

## 2021-09-24 NOTE — Telephone Encounter (Signed)
Requested Prescriptions  ?Pending Prescriptions Disp Refills  ?? fenofibrate (TRICOR) 48 MG tablet [Pharmacy Med Name: FENOFIBRATE 48 MG TABLET] 90 tablet 1  ?  Sig: TAKE ONE TABLET BY MOUTH DAILY  ?  ? Cardiovascular:  Antilipid - Fibric Acid Derivatives Failed - 09/23/2021  6:21 AM  ?  ?  Failed - ALT in normal range and within 360 days  ?  ALT  ?Date Value Ref Range Status  ?03/13/2021 36 (H) 0 - 32 IU/L Final  ?   ?  ?  Failed - Lipid Panel in normal range within the last 12 months  ?  Cholesterol, Total  ?Date Value Ref Range Status  ?03/13/2021 87 (L) 100 - 199 mg/dL Final  ? ?LDL Chol Calc (NIH)  ?Date Value Ref Range Status  ?03/13/2021 25 0 - 99 mg/dL Final  ? ?Direct LDL  ?Date Value Ref Range Status  ?12/02/2019 95.5 0 - 99 mg/dL Final  ?  Comment:  ?  Performed at [facility] [address], [place], [place] [address]  ? ?HDL  ?Date Value Ref Range Status  ?03/13/2021 33 (L) >39 mg/dL Final  ? ?Triglycerides  ?Date Value Ref Range Status  ?03/13/2021 178 (H) 0 - 149 mg/dL Final  ? ?  ?  ?  Passed - AST in normal range and within 360 days  ?  AST  ?Date Value Ref Range Status  ?03/13/2021 26 0 - 40 IU/L Final  ?   ?  ?  Passed - Cr in normal range and within 360 days  ?  Creatinine, Ser  ?Date Value Ref Range Status  ?03/13/2021 0.73 0.57 - 1.00 mg/dL Final  ?   ?  ?  Passed - HGB in normal range and within 360 days  ?  Hemoglobin  ?Date Value Ref Range Status  ?03/13/2021 15.1 11.1 - 15.9 g/dL Final  ?   ?  ?  Passed - HCT in normal range and within 360 days  ?  Hematocrit  ?Date Value Ref Range Status  ?03/13/2021 45.6 34.0 - 46.6 % Final  ?   ?  ?  Passed - PLT in normal range and within 360 days  ?  Platelets  ?Date Value Ref Range Status  ?03/13/2021 340 150 - 450 x10E3/uL Final  ?   ?  ?  Passed - WBC in normal range and within 360 days  ?  WBC  ?Date Value Ref Range Status  ?03/13/2021 10.5 3.4 - 10.8 x10E3/uL Final  ?12/01/2019 7.9 4.0 - 10.5 K/uL Final  ?   ?  ?  Passed - eGFR is 30 or  above and within 360 days  ?  GFR calc Af Amer  ?Date Value Ref Range Status  ?12/02/2019 >60 >60 mL/min Final  ? ?GFR calc non Af Amer  ?Date Value Ref Range Status  ?12/02/2019 >60 >60 mL/min Final  ? ?eGFR  ?Date Value Ref Range Status  ?03/13/2021 98 >59 mL/min/1.73 Final  ?   ?  ?  Passed - Valid encounter within last 12 months  ?  Recent Outpatient Visits   ?      ? 2 months ago Type 2 diabetes mellitus with peripheral neuropathy ([place])  ? [place] Karle Plumber B, MD  ? 5 months ago Pharyngitis, unspecified etiology  ? [place], [place]  ? 6 months ago Type 2 diabetes mellitus with peripheral  neuropathy ([place])  ? [place] Karle Plumber B, MD  ? 10 months ago Type 2 diabetes mellitus with peripheral neuropathy ([place])  ? [place] Karle Plumber B, MD  ? 1 year ago Type 2 diabetes mellitus with peripheral neuropathy ([place])  ? [place] Ladell Pier, MD  ?  ?  ?Future Appointments   ?        ? In 1 month Ladell Pier, MD [place]  ?  ? ?  ?  ?  ? ?

## 2021-09-29 ENCOUNTER — Other Ambulatory Visit: Payer: Self-pay

## 2021-09-29 MED ORDER — OZEMPIC (0.25 OR 0.5 MG/DOSE) 2 MG/3ML ~~LOC~~ SOPN
PEN_INJECTOR | SUBCUTANEOUS | 0 refills | Status: DC
Start: 2021-07-30 — End: 2022-01-26
  Filled 2021-09-29: qty 3, 28d supply, fill #0
  Filled 2021-10-30: qty 3, 56d supply, fill #1
  Filled 2021-12-01: qty 3, 56d supply, fill #2
  Filled 2021-12-29: qty 3, 56d supply, fill #3

## 2021-09-30 ENCOUNTER — Other Ambulatory Visit: Payer: Self-pay

## 2021-10-02 ENCOUNTER — Ambulatory Visit: Payer: Medicaid Other | Admitting: Endocrinology

## 2021-10-06 ENCOUNTER — Ambulatory Visit (INDEPENDENT_AMBULATORY_CARE_PROVIDER_SITE_OTHER): Payer: Medicaid Other | Admitting: Internal Medicine

## 2021-10-06 ENCOUNTER — Encounter: Payer: Self-pay | Admitting: Internal Medicine

## 2021-10-06 VITALS — BP 128/68 | HR 74 | Ht 61.0 in | Wt 228.6 lb

## 2021-10-06 DIAGNOSIS — E1142 Type 2 diabetes mellitus with diabetic polyneuropathy: Secondary | ICD-10-CM

## 2021-10-06 LAB — POCT GLYCOSYLATED HEMOGLOBIN (HGB A1C): Hemoglobin A1C: 7.3 % — AB (ref 4.0–5.6)

## 2021-10-06 MED ORDER — HUMULIN N KWIKPEN 100 UNIT/ML ~~LOC~~ SUPN: 130.0000 [IU] | PEN_INJECTOR | SUBCUTANEOUS | 3 refills | Status: DC

## 2021-10-06 NOTE — Patient Instructions (Addendum)
Please continue: - Metformin ER 1000 mg 2x a day, with meals  Please increase: - Ozempic 0.5 mg weekly   Please change: - NPH 130 units in am (if sugars improve, decrease further)  Please return in 3-4 months with your sugar log.   PATIENT INSTRUCTIONS FOR TYPE 2 DIABETES:  DIET AND EXERCISE Diet and exercise is an important part of diabetic treatment.  We recommended aerobic exercise in the form of brisk walking (working between 40-60% of maximal aerobic capacity, similar to brisk walking) for 150 minutes per week (such as 30 minutes five days per week) along with 3 times per week performing 'resistance' training (using various gauge rubber tubes with handles) 5-10 exercises involving the major muscle groups (upper body, lower body and core) performing 10-15 repetitions (or near fatigue) each exercise. Start at half the above goal but build slowly to reach the above goals. If limited by weight, joint pain, or disability, we recommend daily walking in a swimming pool with water up to waist to reduce pressure from joints while allow for adequate exercise.    BLOOD GLUCOSES Monitoring your blood glucoses is important for continued management of your diabetes. Please check your blood glucoses 2-4 times a day: fasting, before meals and at bedtime (you can rotate these measurements - e.g. one day check before the 3 meals, the next day check before 2 of the meals and before bedtime, etc.).   HYPOGLYCEMIA (low blood sugar) Hypoglycemia is usually a reaction to not eating, exercising, or taking too much insulin/ other diabetes drugs.  Symptoms include tremors, sweating, hunger, confusion, headache, etc. Treat IMMEDIATELY with 15 grams of Carbs: 4 glucose tablets  cup regular juice/soda 2 tablespoons raisins 4 teaspoons sugar 1 tablespoon honey Recheck blood glucose in 15 mins and repeat above if still symptomatic/blood glucose <100.  RECOMMENDATIONS TO REDUCE YOUR RISK OF DIABETIC  COMPLICATIONS: * Take your prescribed MEDICATION(S) * Follow a DIABETIC diet: Complex carbs, fiber rich foods, (monounsaturated and polyunsaturated) fats * AVOID saturated/trans fats, high fat foods, >2,300 mg salt per day. * EXERCISE at least 5 times a week for 30 minutes or preferably daily.  * DO NOT SMOKE OR DRINK more than 1 drink a day. * Check your FEET every day. Do not wear tightfitting shoes. Contact us if you develop an ulcer * See your EYE doctor once a year or more if needed * Get a FLU shot once a year * Get a PNEUMONIA vaccine once before and once after age 43 years  GOALS:  * Your Hemoglobin A1c of <7%  * fasting sugars need to be <130 * after meals sugars need to be <180 (2h after you start eating) * Your Systolic BP should be 315 or lower  * Your Diastolic BP should be 80 or lower  * Your HDL (Good Cholesterol) should be 40 or higher  * Your LDL (Bad Cholesterol) should be 100 or lower. * Your Triglycerides should be 150 or lower  * Your Urine microalbumin (kidney function) should be <30 * Your Body Mass Index should be 25 or lower

## 2021-10-06 NOTE — Progress Notes (Signed)
Patient ID: Teresa Camacho, female   DOB: Oct 15, 1965, 56 y.o.   MRN: [id number]  HPI: Teresa Camacho is a 56 y.o.-year-old female, returning for follow-up for DM2, dx in 2021, insulin-dependent soon post dx., uncontrolled, with complications (PN). Pt. previously saw Dr. Loanne Drilling, last visit 2 months ago.  Reviewed HbA1c: Lab Results  Component Value Date   HGBA1C 7.3 (A) 07/30/2021   HGBA1C 8.9 (A) 05/28/2021   HGBA1C 8.0 (A) 03/26/2021   HGBA1C 8.2 (A) 01/24/2021   HGBA1C 8.8 (A) 11/05/2020   HGBA1C 8.4 (A) 08/19/2020   HGBA1C 9.5 (A) 03/14/2020   HGBA1C 11.5 (H) 12/01/2019   HGBA1C 6.6 (H) 05/26/2013   Pt is on a regimen of: - Metformin ER 1000 mg 2x a day, with meals - Humulin NPH 150 units in am - Ozempic 0.25 mg weekly (could not increase the dose due to GI side effects: Gas, eructations, diarrhea) She was on Lantus before >> sugars high later in the day.  Pt checks her sugars 2x a day and they are: - am: 106, 127-174, 183, 208 - 2h after b'fast: n/c - before lunch: 55 (delayed lunch) - 2h after lunch: n/c - before dinner: n/c - 2h after dinner: 95, 100-149, 202 - bedtime: n/c - nighttime: n/c Lowest sugar was 55 - midday; she has hypoglycemia awareness at 70.  Highest sugar was 266.  Glucometer: True Metrix  - no CKD, last BUN/creatinine:  Lab Results  Component Value Date   BUN 13 03/13/2021   BUN 10 12/02/2019   CREATININE 0.73 03/13/2021   CREATININE 0.49 12/02/2019  She is on lisinopril 10 mg daily.  - +HL; last set of lipids: Lab Results  Component Value Date   CHOL 87 (L) 03/13/2021   HDL 33 (L) 03/13/2021   LDLCALC 25 03/13/2021   LDLDIRECT 95.5 12/02/2019   TRIG 178 (H) 03/13/2021   CHOLHDL 2.6 03/13/2021  She is on Lipitor 80 mg daily and Tricor 48 mg daily.  - last eye exam was "long time ago". No DR.   - + numbness and tingling in her feet.  She is on Neurontin 300 mg x 2 at bedtime last foot exam January 24, 2021.  She has a history of PCOS,  HTN, OSA, GERD, fibroids, s/p TAH.  ROS: + see HPI No increased urination, + blurry vision, no nausea, no chest pain.  PE: BP 128/68 (BP Location: Right Arm, Patient Position: Sitting, Cuff Size: Normal)   Pulse 74   Ht '5\' 1"'$  (1.549 m)   Wt 228 lb 9.6 oz (103.7 kg)   LMP 04/17/2013   SpO2 96%   BMI 43.19 kg/m  Wt Readings from Last 3 Encounters:  10/06/21 228 lb 9.6 oz (103.7 kg)  07/30/21 228 lb 12.8 oz (103.8 kg)  05/28/21 228 lb 12.8 oz (103.8 kg)   Constitutional: overweight, in NAD Eyes: PERRLA, EOMI, no exophthalmos ENT: moist mucous membranes, no thyromegaly, no cervical lymphadenopathy Cardiovascular: RRR, No MRG Respiratory: CTA B Musculoskeletal: no deformities, strength intact in all 4 Skin: moist, warm, no rashes Neurological: no tremor with outstretched hands, DTR normal in all 4  ASSESSMENT: 1. DM2, insulin-dependent, uncontrolled, with complications - PN  2. HL  PLAN:  1. Patient with long-standing, uncontrolled diabetes, on oral antidiabetic regimen with metformin ER, also intermediate acting insulin once a day and Ozempic low-dose once a week, with improving control.  Latest HbA1c was better, at 7.3%, improved from 8.9%. - at today's visit, HbA1c is again  7.3%, stable -At home, blood sugars are still above target in the morning and mostly at goal after dinner.  It appears that the blood sugars in the morning improved significantly in the last months.  She previously had values in the 180s and 200s, but as of now, they are definitely closer to target.  She has not sure what could have changed them. -We discussed about trying again to increase the Ozempic, to which she is open.  She did have gas and eructations and also diarrhea with a higher dose of Ozempic before, and we discussed about trying Mylanta tin the day of the injection and also the day after.  She agrees to do so.  We will also try to decrease the NPH insulin, which was changed from Lantus in an  effort to improve postprandial blood sugars.  We did discuss that we will get to a lower dose of NPH, to change back to Lantus and to try to cover her meals better so that we do not need intermediate acting insulin and hopefully we can work with a peak less insulin. - I suggested to:  Patient Instructions  Please continue: - Metformin ER 1000 mg 2x a day, with meals  Please increase: - Ozempic 0.5 mg weekly   Please change: - NPH 130 units in am (if sugars improve, decrease further)  Please return in 3-4 months with your sugar log.   - check sugars at different times of the day - check 2x a day, rotating checks - discussed about CBG targets for treatment: 80-130 mg/dL before meals and <180 mg/dL after meals; target HbA1c <7%. - given foot care handout  - given instructions for hypoglycemia management "15-15 rule"  - advised for yearly eye exams >> she is due - Return to clinic in 3-47 mo with sugar log   2. HL - Reviewed latest lipid panel from 02/2021: LDL at goal, triglycerides slightly high, HDL slightly low: Lab Results  Component Value Date   CHOL 87 (L) 03/13/2021   HDL 33 (L) 03/13/2021   LDLCALC 25 03/13/2021   LDLDIRECT 95.5 12/02/2019   TRIG 178 (H) 03/13/2021   CHOLHDL 2.6 03/13/2021  - Continues Lipitor 80 mg daily, Tricor 48 mg daily without side effects.  Philemon Kingdom, MD PhD [facility] Endocrinology

## 2021-10-16 ENCOUNTER — Other Ambulatory Visit: Payer: Self-pay

## 2021-10-30 ENCOUNTER — Other Ambulatory Visit: Payer: Self-pay

## 2021-10-31 ENCOUNTER — Other Ambulatory Visit: Payer: Self-pay

## 2021-11-03 ENCOUNTER — Other Ambulatory Visit: Payer: Self-pay

## 2021-11-13 ENCOUNTER — Other Ambulatory Visit: Payer: Self-pay

## 2021-11-13 ENCOUNTER — Ambulatory Visit: Payer: Medicaid Other | Attending: Internal Medicine | Admitting: Internal Medicine

## 2021-11-13 ENCOUNTER — Encounter: Payer: Self-pay | Admitting: Internal Medicine

## 2021-11-13 VITALS — BP 104/69 | HR 79 | Temp 98.4°F | Resp 16 | Wt 228.0 lb

## 2021-11-13 DIAGNOSIS — E1142 Type 2 diabetes mellitus with diabetic polyneuropathy: Secondary | ICD-10-CM

## 2021-11-13 DIAGNOSIS — Z9989 Dependence on other enabling machines and devices: Secondary | ICD-10-CM

## 2021-11-13 DIAGNOSIS — G4733 Obstructive sleep apnea (adult) (pediatric): Secondary | ICD-10-CM

## 2021-11-13 DIAGNOSIS — E1159 Type 2 diabetes mellitus with other circulatory complications: Secondary | ICD-10-CM

## 2021-11-13 DIAGNOSIS — Z1231 Encounter for screening mammogram for malignant neoplasm of breast: Secondary | ICD-10-CM

## 2021-11-13 DIAGNOSIS — E1169 Type 2 diabetes mellitus with other specified complication: Secondary | ICD-10-CM

## 2021-11-13 DIAGNOSIS — Z6841 Body Mass Index (BMI) 40.0 and over, adult: Secondary | ICD-10-CM

## 2021-11-13 DIAGNOSIS — E785 Hyperlipidemia, unspecified: Secondary | ICD-10-CM

## 2021-11-13 DIAGNOSIS — I152 Hypertension secondary to endocrine disorders: Secondary | ICD-10-CM

## 2021-11-13 MED ORDER — LISINOPRIL 10 MG PO TABS: 10.0000 mg | ORAL_TABLET | Freq: Every day | ORAL | 2 refills | Status: DC

## 2021-11-13 MED ORDER — INSULIN PEN NEEDLE 32G X 4 MM MISC
1.0000 | Freq: Two times a day (BID) | 1 refills | Status: DC
  Filled 2021-11-13: qty 100, 50d supply, fill #0
  Filled 2021-12-01: qty 100, 25d supply, fill #0
  Filled 2021-12-31: qty 100, 25d supply, fill #1
  Filled 2022-01-05: qty 100, 50d supply, fill #1
  Filled 2022-02-24: qty 100, 25d supply, fill #1

## 2021-11-13 MED ORDER — METOPROLOL TARTRATE 25 MG PO TABS: 25.0000 mg | ORAL_TABLET | Freq: Two times a day (BID) | ORAL | 1 refills | Status: DC

## 2021-11-13 MED ORDER — GABAPENTIN 300 MG PO CAPS
600.0000 mg | ORAL_CAPSULE | Freq: Every day | ORAL | 5 refills | Status: DC
  Filled 2021-11-13 – 2021-12-01 (×2): qty 60, 30d supply, fill #0
  Filled 2021-12-30: qty 60, 30d supply, fill #1
  Filled 2022-02-24: qty 60, 30d supply, fill #2
  Filled 2022-04-13: qty 60, 30d supply, fill #3
  Filled 2022-05-12: qty 60, 30d supply, fill #4
  Filled 2022-06-08 – 2022-06-10 (×2): qty 60, 30d supply, fill #5

## 2021-11-13 MED ORDER — ATORVASTATIN CALCIUM 80 MG PO TABS: 80.0000 mg | ORAL_TABLET | Freq: Every day | ORAL | 2 refills | Status: DC

## 2021-11-13 MED ORDER — FENOFIBRATE 48 MG PO TABS: 48.0000 mg | ORAL_TABLET | Freq: Every day | ORAL | 2 refills | Status: DC

## 2021-11-13 MED ORDER — INSULIN PEN NEEDLE 32G X 4 MM MISC
1.0000 | Freq: Two times a day (BID) | 1 refills | Status: DC
  Filled 2021-11-13: qty 100, 34d supply, fill #0

## 2021-11-13 NOTE — Progress Notes (Signed)
Patient ID: Teresa Camacho, female    DOB: 1965/09/14  MRN: [id number]  CC: Chronic disease management  Subjective: Teresa Camacho is a 56 y.o. female who presents for chronic ds management Her concerns today include:  Patient with history of DM type II with microalbumin, HTN, HL, OSA on CPAP, GERD,PCOS, pattern baldness, RLS (Gabapentin).  DM/obesity: Lab Results  Component Value Date   HGBA1C 7.3 (A) 10/06/2021  Dr. Loanne Drilling has left.  She now sees Dr. Cruzita Lederer and was seen 10/06/2021. Patient was continued on metformin ER 1 g twice a day, Ozempic increased to 0.5 mg weekly and NPH insulin at 120 units every morning.  Tolerating meds -Reports compliance with meds. -Weight has remained unchanged since March. Feels she is doing good with eating habits. Walks 3-4 times a wk for 45-60 mins  HTN: Compliant with taking lisinopril 10 mg and metoprolol 25 mg BID.  She limits salt in the foods. No CP/SOB.  Little LE edema sometimes  HL: Reports compliance with Tricor and Lipitor.  Last LDL was 25 and triglyceride 178.   OSA: Our caseworker has been working on trying to get her new CPAP machine.  Order was faxed to [employer] in February.  Patient states she has not heard anything since then. Continues to use her current machine in the main time  HM: she has not heard anything from [facility].  She sent off paper work mths ago.  Over due for eye exam.  She checked into [facility] and [facility] but not able to afford.    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   History of allergy 12/26/2019   OSA on CPAP 12/26/2019   Class 3 severe obesity due to excess calories with serious comorbidity and body mass index (BMI) of 40.0 to 44.9 in adult ([place])  12/26/2019   Hyperlipidemia associated with type 2 diabetes mellitus ([place]) 12/26/2019   Essential hypertension 12/26/2019   Family history of colonic polyps 12/26/2019   Gastroesophageal reflux disease 12/26/2019   Type 2 diabetes mellitus with hyperglycemia, with long-term current use of insulin ([place]) 11/30/2019   Iron deficiency anemia due to chronic blood loss 06/28/2013   S/P hysterectomy 06/13/2013     Current Outpatient Medications on File Prior to Visit  Medication Sig Dispense Refill   aspirin EC 81 MG tablet Take 1 tablet (81 mg total) by mouth daily. Swallow whole. (Patient taking differently: Take 81 mg by mouth in the morning. Swallow whole.) 100 tablet 2   blood glucose meter kit and supplies KIT Dispense based on patient and insurance preference. Use up to four times daily as directed. (FOR ICD-9 250.00, 250.01). 1 each 0   Blood Glucose Monitoring Suppl (TRUE METRIX METER) w/Device KIT use kit to check blood glucose three times daily 1 kit 0   Cholecalciferol (VITAMIN D-3) 125 MCG (5000 UT) TABS Take 5,000 Units by mouth in the morning.     dapagliflozin propanediol (FARXIGA) 5 MG TABS tablet Take 1 tablet (5 mg total) by mouth daily. 90 tablet 3   desloratadine (CLARINEX) 5 MG tablet Take 5 mg by mouth at bedtime.     diphenhydrAMINE (BENADRYL) 25 mg capsule Take 50 mg by mouth at bedtime.  glucose blood (TRUE METRIX BLOOD GLUCOSE TEST) test strip use one test strip to check blood glucose three times a day 100 strip 3   Insulin NPH, Human,, Isophane, (HUMULIN N KWIKPEN) 100 UNIT/ML Kiwkpen Inject 130 Units into the skin every morning. 150 mL 3   loratadine (CLARITIN) 10 MG tablet Take 1 tablet (10 mg total) by mouth daily. 30 tablet 6   Melatonin 10 MG SUBL Place 20 mg under the tongue at bedtime.     metFORMIN (GLUCOPHAGE-XR) 500 MG 24 hr tablet TAKE FOUR TABLETS BY MOUTH DAILY 360 tablet 3   Multiple Vitamins-Minerals (MULTIVITAMIN WITH MINERALS) tablet Take 1 tablet by  mouth daily.     pantoprazole (PROTONIX) 40 MG tablet TAKE 1 TABLET BY MOUTH TWO TIMES A DAY BEFORE A MEAL 180 tablet 1   Semaglutide,0.25 or 0.5MG/DOS, (OZEMPIC, 0.25 OR 0.5 MG/DOSE,) 2 MG/1.5ML SOPN Inject 0.25 mg into the skin once a week. 4.5 mL 3   traZODone (DESYREL) 100 MG tablet Take 100 mg by mouth at bedtime.     TRUEplus Lancets 28G MISC use lancet to check blood glucose three times daily 100 each 3   No current facility-administered medications on file prior to visit.    Allergies  Allergen Reactions   Wound Dressing Adhesive Other (See Comments)    Redness/irritation   Codeine Nausea And Vomiting    Pt is able to take percocet & vicodin   Latex Rash    irritation    Social History   Socioeconomic History   Marital status: Divorced    Spouse name: Not on file   Number of children: 5   Years of education: Not on file   Highest education level: Not on file  Occupational History   Not on file  Tobacco Use   Smoking status: Never   Smokeless tobacco: Never  Vaping Use   Vaping Use: Never used  Substance and Sexual Activity   Alcohol use: No   Drug use: No   Sexual activity: Not Currently  Other Topics Concern   Not on file  Social History Narrative   Not on file   Social Determinants of Health   Financial Resource Strain: Not on file  Food Insecurity: Not on file  Transportation Needs: Not on file  Physical Activity: Not on file  Stress: Not on file  Social Connections: Not on file  Intimate Partner Violence: Not on file    Family History  Problem Relation Age of Onset   Depression Mother    Diabetes Mother    Arthritis Mother    Heart disease Mother    Learning disabilities Mother    Mental illness Mother    Liver cancer Mother        metastatic from colon??   Arthritis Father    Prostate cancer Father        met to esophagus   Colon polyps Sister    Cancer Maternal Aunt        x 2 Aunts    Past Surgical History:  Procedure Laterality  Date   ABDOMINAL HYSTERECTOMY N/A 06/13/2013   Procedure: HYSTERECTOMY ABDOMINAL and removal of vaginal skin tag;  Surgeon: Luz Lex, MD;  Location: [facility];  Service: Gynecology;  Laterality: N/A;   APPENDECTOMY     BALLOON DILATION N/A 08/27/2020   Procedure: BALLOON DILATION;  Surgeon: Irene Shipper, MD;  Location: [facility];  Service: Endoscopy;  Laterality: N/A;   CESAREAN SECTION  3x   CHOLECYSTECTOMY     CHOLECYSTECTOMY  1991   COLONOSCOPY WITH PROPOFOL N/A 08/27/2020   Procedure: COLONOSCOPY WITH PROPOFOL;  Surgeon: Irene Shipper, MD;  Location: [facility];  Service: Endoscopy;  Laterality: N/A;   ESOPHAGOGASTRODUODENOSCOPY (EGD) WITH PROPOFOL N/A 08/27/2020   Procedure: ESOPHAGOGASTRODUODENOSCOPY (EGD) WITH PROPOFOL;  Surgeon: Irene Shipper, MD;  Location: [facility];  Service: Endoscopy;  Laterality: N/A;   POLYPECTOMY  08/27/2020   Procedure: POLYPECTOMY;  Surgeon: Irene Shipper, MD;  Location: [facility];  Service: Endoscopy;;    ROS: Review of Systems Negative except as stated above  PHYSICAL EXAM: BP 104/69 (BP Location: Left Arm, Patient Position: Sitting, Cuff Size: Large)   Pulse 79   Temp 98.4 F (36.9 C) (Oral)   Resp 16   Wt 228 lb (103.4 kg)   LMP 03/13/2013   SpO2 96%   BMI 43.08 kg/m   Wt Readings from Last 3 Encounters:  11/13/21 228 lb (103.4 kg)  10/06/21 228 lb 9.6 oz (103.7 kg)  07/30/21 228 lb 12.8 oz (103.8 kg)    Physical Exam  General appearance - alert, well appearing,  middle age caucasian female and in no distress Mental status - normal mood, behavior, speech, dress, motor activity, and thought processes Neck - supple, no significant adenopathy Chest - clear to auscultation, no wheezes, rales or rhonchi, symmetric air entry Heart - normal rate, regular rhythm, normal S1, S2, no murmurs, rubs, clicks or gallops Extremities - peripheral pulses normal, no pedal edema, no clubbing or cyanosis      Latest Ref Rng & Units  03/13/2021   11:08 AM 12/02/2019    6:48 AM 12/01/2019    5:37 AM  CMP  Glucose 70 - 99 mg/dL 163  323  329   BUN 6 - 24 mg/dL 13  10  9    Creatinine 0.57 - 1.00 mg/dL 0.73  0.49  0.58   Sodium 134 - 144 mmol/L 140  137  137   Potassium 3.5 - 5.2 mmol/L 4.6  3.8  3.9   Chloride 96 - 106 mmol/L 103  103  102   CO2 20 - 29 mmol/L 18  21  21    Calcium 8.7 - 10.2 mg/dL 10.4  8.9  8.6   Total Protein 6.0 - 8.5 g/dL 7.2     Total Bilirubin 0.0 - 1.2 mg/dL 0.9     Alkaline Phos 44 - 121 IU/L 81     AST 0 - 40 IU/L 26     ALT 0 - 32 IU/L 36      Lipid Panel     Component Value Date/Time   CHOL 87 (L) 03/13/2021 1108   TRIG 178 (H) 03/13/2021 1108   HDL 33 (L) 03/13/2021 1108   CHOLHDL 2.6 03/13/2021 1108   CHOLHDL 8.1 12/02/2019 0648   VLDL UNABLE TO CALCULATE IF TRIGLYCERIDE OVER 400 mg/dL 12/02/2019 0648   LDLCALC 25 03/13/2021 1108   LDLDIRECT 95.5 12/02/2019 0648    CBC    Component Value Date/Time   WBC 10.5 03/13/2021 1108   WBC 7.9 12/01/2019 0537   RBC 5.40 (H) 03/13/2021 1108   RBC 5.08 12/01/2019 0537   HGB 15.1 03/13/2021 1108   HCT 45.6 03/13/2021 1108   PLT 340 03/13/2021 1108   MCV 84 03/13/2021 1108   MCH 28.0 03/13/2021 1108   MCH 28.7 12/01/2019 0537   MCHC 33.1 03/13/2021 1108   MCHC 32.4 12/01/2019 0537   RDW  12.9 03/13/2021 1108   LYMPHSABS 3.2 06/14/2013 2217   MONOABS 1.0 06/14/2013 2217   EOSABS 0.4 06/14/2013 2217   BASOSABS 0.0 06/14/2013 2217    ASSESSMENT AND PLAN:  1. Type 2 diabetes mellitus with peripheral neuropathy (HCC) Followed by endo.  Current current meds listed above Doing well.  Encouraged to continue healthy eating habits and regular exercise - gabapentin (NEURONTIN) 300 MG capsule; Take 2 capsules (600 mg total) by mouth at bedtime.  Dispense: 60 capsule; Refill: 5 - Insulin Pen Needle 32G X 4 MM MISC; Use in the morning and at bedtime  Dispense: 100 each; Refill: 1  2. Class 3 severe obesity due to excess calories with  serious comorbidity and body mass index (BMI) of 40.0 to 44.9 in adult [facility]) See #1 above  3. Hypertension associated with diabetes ([place]) At goal.  Continue current meds - lisinopril (ZESTRIL) 10 MG tablet; Take 1 tablet (10 mg total) by mouth daily.  Dispense: 90 tablet; Refill: 2 - metoprolol tartrate (LOPRESSOR) 25 MG tablet; Take 1 tablet (25 mg total) by mouth 2 (two) times daily.  Dispense: 360 tablet; Refill: 1  4. Hyperlipidemia associated with type 2 diabetes mellitus (HCC) LDL at goal - fenofibrate (TRICOR) 48 MG tablet; Take 1 tablet (48 mg total) by mouth daily.  Dispense: 90 tablet; Refill: 2 - atorvastatin (LIPITOR) 80 MG tablet; Take 1 tablet (80 mg total) by mouth daily.  Dispense: 90 tablet; Refill: 2  5. OSA on CPAP Message sent to [facility] inquiring about the CPAP that was ordered 06/2021  6. Encounter for screening mammogram for malignant neoplasm of breast My CMA gave her phone # to call the [facility] scholarship program  Patient was given the opportunity to ask questions.  Patient verbalized understanding of the plan and was able to repeat key elements of the plan.   This documentation was completed using Radio producer.  Any transcriptional errors are unintentional.  No orders of the defined types were placed in this encounter.    Requested Prescriptions   Signed Prescriptions Disp Refills   fenofibrate (TRICOR) 48 MG tablet 90 tablet 2    Sig: Take 1 tablet (48 mg total) by mouth daily.   atorvastatin (LIPITOR) 80 MG tablet 90 tablet 2    Sig: Take 1 tablet (80 mg total) by mouth daily.   gabapentin (NEURONTIN) 300 MG capsule 60 capsule 5    Sig: Take 2 capsules (600 mg total) by mouth at bedtime.   lisinopril (ZESTRIL) 10 MG tablet 90 tablet 2    Sig: Take 1 tablet (10 mg total) by mouth daily.   metoprolol tartrate (LOPRESSOR) 25 MG tablet 360 tablet 1    Sig: Take 1 tablet (25 mg total) by mouth 2 (two) times daily.   Insulin Pen Needle 32G X  4 MM MISC 100 each 1    Sig: Use in the morning and at bedtime    Return in about 4 months (around 03/15/2022).  Karle Plumber, MD, FACP

## 2021-11-13 NOTE — Progress Notes (Signed)
Has not heard back about  CPAP or mammogram

## 2021-11-19 ENCOUNTER — Other Ambulatory Visit: Payer: Self-pay

## 2021-11-24 ENCOUNTER — Other Ambulatory Visit: Payer: Self-pay

## 2021-11-28 ENCOUNTER — Other Ambulatory Visit: Payer: Self-pay

## 2021-12-01 ENCOUNTER — Other Ambulatory Visit: Payer: Self-pay

## 2021-12-12 ENCOUNTER — Encounter: Payer: Medicaid Other | Admitting: Physician Assistant

## 2021-12-12 ENCOUNTER — Ambulatory Visit: Payer: Self-pay

## 2021-12-12 MED ORDER — NIRMATRELVIR/RITONAVIR (PAXLOVID)TABLET: 3.0000 | ORAL_TABLET | Freq: Two times a day (BID) | ORAL | 0 refills | Status: AC

## 2021-12-12 MED ORDER — ALBUTEROL SULFATE HFA 108 (90 BASE) MCG/ACT IN AERS: 2.0000 | INHALATION_SPRAY | Freq: Four times a day (QID) | RESPIRATORY_TRACT | 0 refills | Status: DC | PRN

## 2021-12-12 NOTE — Addendum Note (Signed)
Addended by: Karle Plumber B on: 12/12/2021 06:10 PM   Modules accepted: Orders

## 2021-12-12 NOTE — Telephone Encounter (Signed)
Criteria for self-isolation if you test positive for COVID 19 regardless of vaccine status: If you have mild symptoms that are resolving or have resolved, isolate at home for 5 days since symptoms started and continue to wear a well fitted mask when around others in the home and in public for 5 additional days after isolation is completed.  If you have a fever and/or moderate to severe symptoms, isolate for at least 10 days since the symptoms started and until you are fever free for at least 24 hours without the use of fever reducing medications. If you tested positive and did not have symptoms, isolate for at least 5 days after your positive test.  Use over-the-counter medications for symptoms.  If you develop respiratory issues/distress, seek medical care in the emergency department.  If you must leave home or if you have to be around others, please wear a mask.  Please limit contact with immediate family members in the home, practice social distancing, frequent handwashing and clean hard surfaces touched frequently with household cleaning products.  Members of your household will also need to quarantine and be tested.

## 2021-12-12 NOTE — Telephone Encounter (Signed)
Phone call placed to patient this evening.  Patient informed me that she was having body aches, headaches, sore throat, nasal congestion low-grade fever, coughing and wheezing for 3 days.  Tested positive for COVID yesterday.  She has had at least 3 COVID-19 vaccines.  She wanted to know what to do and whether she needed to take Paxlovid.  Advised patient to take Tylenol as needed for the body aches and fever.  I will send a prescription to the pharmacy for albuterol inhaler for her to use for the cough and wheezing.  She has used an inhaler before.  Advised taking the Paxlovid to help shorten the course and hopefully prevent hospitalization.  Advised that the medication can cause diarrhea and change in taste.  Advised to hold atorvastatin while taking the medication and for at least 5 to 7 days after she completes the medicine.  Recommend Coricidin HBP over-the-counter as needed for the congestion.  If she develops any significant shortness of breath or symptoms worsen, advised to be seen in the emergency room.  Isolation precautions given as stated below: Criteria for self-isolation if you test positive for COVID 19 regardless of vaccine status: If you have mild symptoms that are resolving or have resolved, isolate at home for 5 days since symptoms started and continue to wear a well fitted mask when around others in the home and in public for 5 additional days after isolation is completed.  If you have a fever and/or moderate to severe symptoms, isolate for at least 10 days since the symptoms started and until you are fever free for at least 24 hours without the use of fever reducing medications. If you tested positive and did not have symptoms, isolate for at least 5 days after your positive test.  Use over-the-counter medications for symptoms.  If you develop respiratory issues/distress, seek medical care in the emergency department.  If you must leave home or if you have to be around others, please  wear a mask.  Please limit contact with immediate family members in the home, practice social distancing, frequent handwashing and clean hard surfaces touched frequently with household cleaning products.  Members of your household will also need to quarantine and be tested.

## 2021-12-12 NOTE — Telephone Encounter (Signed)
Pt requests to speak with a nurse because she tested positive for Covid and has questions and concerns. Cb# [phone]     Chief Complaint: COVID 19 positive. Cough, wheezing, headache, body aches, fever. Asking to be worked in for [employer]. States she cannot due VV through [facility] health because she does not have insurance and can not afford to pay. Symptoms: Above Frequency: 2 days ago Pertinent Negatives: Patient denies  Disposition: '[]'$ ED /'[]'$ Urgent Care (no appt availability in office) / '[]'$ Appointment(In office/virtual)/ '[]'$  [facility]/ '[]'$ Home Care/ '[]'$ Refused Recommended Disposition /'[]'$ [facility]/ '[x]'$  Follow-up with PCP Additional Notes: Please advise pt.  Answer Assessment - Initial Assessment Questions 1. COVID-19 DIAGNOSIS: "How do you know that you have COVID?" (e.g., positive lab test or self-test, diagnosed by doctor or NP/PA, symptoms after exposure).     Home test 2. COVID-19 EXPOSURE: "Was there any known exposure to COVID before the symptoms began?" CDC Definition of close contact: within 6 feet (2 meters) for a total of 15 minutes or more over a 24-hour period.      No 3. ONSET: "When did the COVID-19 symptoms start?"      2 days ago 4. WORST SYMPTOM: "What is your worst symptom?" (e.g., cough, fever, shortness of breath, muscle aches)     Body aches, headache 5. COUGH: "Do you have a cough?" If Yes, ask: "How bad is the cough?"       Yes 6. FEVER: "Do you have a fever?" If Yes, ask: "What is your temperature, how was it measured, and when did it start?"     Yes 7. RESPIRATORY STATUS: "Describe your breathing?" (e.g., normal; shortness of breath, wheezing, unable to speak)      Deep cough, wheezing 8. BETTER-SAME-WORSE: "Are you getting better, staying the same or getting worse compared to yesterday?"  If getting worse, ask, "In what way?"     Worse 9. OTHER SYMPTOMS: "Do you have any other symptoms?"  (e.g., chills, fatigue, headache, loss of  smell or taste, muscle pain, sore throat)     N/a 10. HIGH RISK DISEASE: "Do you have any chronic medical problems?" (e.g., asthma, heart or lung disease, weak immune system, obesity, etc.)       Yes 11. VACCINE: "Have you had the COVID-19 vaccine?" If Yes, ask: "Which one, how many shots, when did you get it?"       N/a 12. PREGNANCY: "Is there any chance you are pregnant?" "When was your last menstrual period?"       No 13. O2 SATURATION MONITOR:  "Do you use an oxygen saturation monitor (pulse oximeter) at home?" If Yes, ask "What is your reading (oxygen level) today?" "What is your usual oxygen saturation reading?" (e.g., 95%)       No  Protocols used: Coronavirus (COVID-19) Diagnosed or Suspected-A-AH

## 2021-12-12 NOTE — Telephone Encounter (Signed)
FYI

## 2021-12-12 NOTE — Progress Notes (Signed)
Patient saw PCP. Will mark erroneous.

## 2021-12-12 NOTE — Telephone Encounter (Signed)
Pt called back as she had not heard from PCP.  Pt is quite upset. Gave number for PT experience. Made virtual appt for 6:30pm.

## 2021-12-18 ENCOUNTER — Telehealth: Payer: Self-pay

## 2021-12-18 NOTE — Telephone Encounter (Signed)
I spoke to [employer] Assistance Program and paid the $100 donation for patient's CPAP machine on behalf of [place]. Alice said she will process the payment and send the machine to [facility] [place], [place], [place].

## 2021-12-22 ENCOUNTER — Other Ambulatory Visit: Payer: Self-pay | Admitting: Internal Medicine

## 2021-12-22 DIAGNOSIS — K219 Gastro-esophageal reflux disease without esophagitis: Secondary | ICD-10-CM

## 2021-12-29 ENCOUNTER — Other Ambulatory Visit: Payer: Self-pay

## 2021-12-30 ENCOUNTER — Other Ambulatory Visit: Payer: Self-pay

## 2021-12-30 NOTE — Telephone Encounter (Signed)
Call placed to patient and informed her that her CPAP machine has been delivered to [facility] from the [employer].   [facility] will be contacting her to schedule an appointment for educating her about the use and care of the machine and she will pick it up at that time  She verbalized understanding.

## 2021-12-31 ENCOUNTER — Other Ambulatory Visit: Payer: Self-pay

## 2022-01-05 ENCOUNTER — Other Ambulatory Visit: Payer: Self-pay

## 2022-01-12 ENCOUNTER — Other Ambulatory Visit: Payer: Self-pay

## 2022-01-15 ENCOUNTER — Other Ambulatory Visit: Payer: Self-pay

## 2022-01-16 ENCOUNTER — Other Ambulatory Visit: Payer: Self-pay

## 2022-01-16 ENCOUNTER — Ambulatory Visit (HOSPITAL_BASED_OUTPATIENT_CLINIC_OR_DEPARTMENT_OTHER): Payer: Medicaid Other | Admitting: Internal Medicine

## 2022-01-26 ENCOUNTER — Other Ambulatory Visit: Payer: Self-pay

## 2022-01-26 ENCOUNTER — Other Ambulatory Visit: Payer: Self-pay | Admitting: Endocrinology

## 2022-01-26 DIAGNOSIS — E1142 Type 2 diabetes mellitus with diabetic polyneuropathy: Secondary | ICD-10-CM

## 2022-01-26 MED ORDER — OZEMPIC (0.25 OR 0.5 MG/DOSE) 2 MG/3ML ~~LOC~~ SOPN
PEN_INJECTOR | SUBCUTANEOUS | 0 refills | Status: DC
  Filled 2022-01-26: qty 16.5, fill #0

## 2022-01-27 ENCOUNTER — Other Ambulatory Visit: Payer: Self-pay

## 2022-01-28 ENCOUNTER — Other Ambulatory Visit: Payer: Self-pay

## 2022-01-28 MED ORDER — SEMAGLUTIDE(0.25 OR 0.5MG/DOS) 2 MG/3ML ~~LOC~~ SOPN
0.5000 mg | PEN_INJECTOR | SUBCUTANEOUS | 0 refills | Status: DC
  Filled 2022-01-28: qty 3, 28d supply, fill #0

## 2022-01-28 NOTE — Addendum Note (Signed)
Addended by: Lauralyn Primes on: 01/28/2022 09:55 AM   Modules accepted: Orders

## 2022-01-29 ENCOUNTER — Other Ambulatory Visit: Payer: Self-pay

## 2022-02-06 ENCOUNTER — Other Ambulatory Visit: Payer: Self-pay

## 2022-02-06 ENCOUNTER — Encounter: Payer: Self-pay | Admitting: Internal Medicine

## 2022-02-06 ENCOUNTER — Ambulatory Visit (INDEPENDENT_AMBULATORY_CARE_PROVIDER_SITE_OTHER): Payer: Medicaid Other | Admitting: Internal Medicine

## 2022-02-06 VITALS — BP 114/76 | HR 94 | Ht 61.0 in | Wt 224.0 lb

## 2022-02-06 DIAGNOSIS — E1169 Type 2 diabetes mellitus with other specified complication: Secondary | ICD-10-CM

## 2022-02-06 DIAGNOSIS — E785 Hyperlipidemia, unspecified: Secondary | ICD-10-CM

## 2022-02-06 DIAGNOSIS — E1142 Type 2 diabetes mellitus with diabetic polyneuropathy: Secondary | ICD-10-CM

## 2022-02-06 LAB — POCT GLYCOSYLATED HEMOGLOBIN (HGB A1C): Hemoglobin A1C: 6.6 % — AB (ref 4.0–5.6)

## 2022-02-06 MED ORDER — HUMULIN N KWIKPEN 100 UNIT/ML ~~LOC~~ SUPN
100.0000 [IU] | PEN_INJECTOR | SUBCUTANEOUS | 3 refills | Status: DC
Start: 2022-02-06 — End: 2022-05-25

## 2022-02-06 MED ORDER — OZEMPIC (1 MG/DOSE) 4 MG/3ML ~~LOC~~ SOPN
1.0000 mg | PEN_INJECTOR | SUBCUTANEOUS | 3 refills | Status: DC
  Filled 2022-02-06: qty 3, 28d supply, fill #0
  Filled 2022-03-16: qty 3, 28d supply, fill #1
  Filled 2022-04-13: qty 3, 28d supply, fill #2
  Filled 2022-05-12: qty 6, 56d supply, fill #3
  Filled 2022-05-12: qty 3, 28d supply, fill #3

## 2022-02-06 NOTE — Addendum Note (Signed)
Addended by: Jefferson Fuel on: 02/06/2022 11:10 AM   Modules accepted: Orders

## 2022-02-06 NOTE — Progress Notes (Signed)
Patient ID: Teresa Camacho, female   DOB: 01/21/1966, 56 y.o.   MRN: [id number]  HPI: Teresa Camacho is a 56 y.o.-year-old female, returning for follow-up for DM2, dx in 2021, insulin-dependent soon post dx., uncontrolled, with complications (PN). Pt. previously saw Dr. Loanne Drilling, last visit with me 4 months ago.  Interim history: No increased urination, blurry vision, nausea, chest pain. She was able to start the higher dose of Ozempic without side effects.  Reviewed HbA1c: Lab Results  Component Value Date   HGBA1C 7.3 (A) 10/06/2021   HGBA1C 7.3 (A) 07/30/2021   HGBA1C 8.9 (A) 05/28/2021   HGBA1C 8.0 (A) 03/26/2021   HGBA1C 8.2 (A) 01/24/2021   HGBA1C 8.8 (A) 11/05/2020   HGBA1C 8.4 (A) 08/19/2020   HGBA1C 9.5 (A) 03/14/2020   HGBA1C 11.5 (H) 12/01/2019   HGBA1C 6.6 (H) 05/26/2013   Pt is on a regimen of: - Metformin ER 1000 mg 2x a day, with meals - Humulin NPH 150 >> 130 units in am - Ozempic 0.25 mg weekly (could not increase the dose due to GI side effects: Gas, eructations, diarrhea) >> 0.5 mg weekly (increased 09/2021) She was on Lantus before >> sugars high later in the day.  Pt checks her sugars 2x a day and they are: - am: 106, 127-174, 183, 208 >> 85, 112-156, 159, 177 - 2h after b'fast: n/c - before lunch: 55 (delayed lunch) >> n/c - 2h after lunch: n/c - before dinner: n/c >> 70-105 - 2h after dinner: 95, 100-149, 202 >> 56, 79-155, 209 - bedtime: n/c - nighttime: n/c Lowest sugar was 55 - midday >> 56; she has hypoglycemia awareness at 56.  Highest sugar was 266.  Glucometer: True Metrix  - no CKD, last BUN/creatinine:  Lab Results  Component Value Date   BUN 13 03/13/2021   BUN 10 12/02/2019   CREATININE 0.73 03/13/2021   CREATININE 0.49 12/02/2019  She is on lisinopril 10 mg daily.  - +HL; last set of lipids: Lab Results  Component Value Date   CHOL 87 (L) 03/13/2021   HDL 33 (L) 03/13/2021   LDLCALC 25 03/13/2021   LDLDIRECT 95.5 12/02/2019    TRIG 178 (H) 03/13/2021   CHOLHDL 2.6 03/13/2021  She is on Lipitor 80 mg daily and Tricor 48 mg daily.  - last eye exam was "long time ago". No DR.   - + numbness and tingling in her feet.  She is on Neurontin 300 mg x 2 at bedtime last foot exam January 24, 2021.  She has a history of PCOS, HTN, OSA, GERD, fibroids, s/p TAH.  ROS: + see HPI  Past Medical History:  Diagnosis Date   Anemia    Blood transfusion without reported diagnosis    Fibroids, submucosal 05/03/2013   GERD (gastroesophageal reflux disease)    Hyperlipidemia    Hypertension    PCOS (polycystic ovarian syndrome)    PONV (postoperative nausea and vomiting)    " super tight airway" as per pt   Sleep apnea    Type II diabetes mellitus ([place])    Past Surgical History:  Procedure Laterality Date   ABDOMINAL HYSTERECTOMY N/A 06/13/2013   Procedure: HYSTERECTOMY ABDOMINAL and removal of vaginal skin tag;  Surgeon: Luz Lex, MD;  Location: [facility];  Service: Gynecology;  Laterality: N/A;   APPENDECTOMY     BALLOON DILATION N/A 08/27/2020   Procedure: BALLOON DILATION;  Surgeon: Irene Shipper, MD;  Location: [facility];  Service: Endoscopy;  Laterality: N/A;   CESAREAN SECTION     3x   CHOLECYSTECTOMY     CHOLECYSTECTOMY  1991   COLONOSCOPY WITH PROPOFOL N/A 08/27/2020   Procedure: COLONOSCOPY WITH PROPOFOL;  Surgeon: Irene Shipper, MD;  Location: [facility];  Service: Endoscopy;  Laterality: N/A;   ESOPHAGOGASTRODUODENOSCOPY (EGD) WITH PROPOFOL N/A 08/27/2020   Procedure: ESOPHAGOGASTRODUODENOSCOPY (EGD) WITH PROPOFOL;  Surgeon: Irene Shipper, MD;  Location: [facility];  Service: Endoscopy;  Laterality: N/A;   POLYPECTOMY  08/27/2020   Procedure: POLYPECTOMY;  Surgeon: Irene Shipper, MD;  Location: [facility];  Service: Endoscopy;;   Social History   Socioeconomic History   Marital status: Divorced    Spouse name: Not on file   Number of children: 5   Years of education: Not on file   Highest  education level: Not on file  Occupational History   Not on file  Tobacco Use   Smoking status: Never   Smokeless tobacco: Never  Vaping Use   Vaping Use: Never used  Substance and Sexual Activity   Alcohol use: No   Drug use: No   Sexual activity: Not Currently  Other Topics Concern   Not on file  Social History Narrative   Not on file   Social Determinants of Health   Financial Resource Strain: Not on file  Food Insecurity: Not on file  Transportation Needs: Not on file  Physical Activity: Not on file  Stress: Not on file  Social Connections: Not on file  Intimate Partner Violence: Not on file   Current Outpatient Medications on File Prior to Visit  Medication Sig Dispense Refill   albuterol (VENTOLIN HFA) 108 (90 Base) MCG/ACT inhaler Inhale 2 puffs into the lungs every 6 (six) hours as needed for wheezing or shortness of breath. 8 g 0   aspirin EC 81 MG tablet Take 1 tablet (81 mg total) by mouth daily. Swallow whole. (Patient taking differently: Take 81 mg by mouth in the morning. Swallow whole.) 100 tablet 2   atorvastatin (LIPITOR) 80 MG tablet Take 1 tablet (80 mg total) by mouth daily. 90 tablet 2   blood glucose meter kit and supplies KIT Dispense based on patient and insurance preference. Use up to four times daily as directed. (FOR ICD-9 250.00, 250.01). 1 each 0   Blood Glucose Monitoring Suppl (TRUE METRIX METER) w/Device KIT use kit to check blood glucose three times daily 1 kit 0   Cholecalciferol (VITAMIN D-3) 125 MCG (5000 UT) TABS Take 5,000 Units by mouth in the morning.     dapagliflozin propanediol (FARXIGA) 5 MG TABS tablet Take 1 tablet (5 mg total) by mouth daily. 90 tablet 3   desloratadine (CLARINEX) 5 MG tablet Take 5 mg by mouth at bedtime.     diphenhydrAMINE (BENADRYL) 25 mg capsule Take 50 mg by mouth at bedtime.     fenofibrate (TRICOR) 48 MG tablet Take 1 tablet (48 mg total) by mouth daily. 90 tablet 2   gabapentin (NEURONTIN) 300 MG capsule  Take 2 capsules (600 mg total) by mouth at bedtime. 60 capsule 5   glucose blood (TRUE METRIX BLOOD GLUCOSE TEST) test strip use one test strip to check blood glucose three times a day 100 strip 3   Insulin NPH, Human,, Isophane, (HUMULIN N KWIKPEN) 100 UNIT/ML Kiwkpen Inject 130 Units into the skin every morning. 150 mL 3   Insulin Pen Needle 32G X 4 MM MISC Use in the morning and at bedtime 100 each  1   lisinopril (ZESTRIL) 10 MG tablet Take 1 tablet (10 mg total) by mouth daily. 90 tablet 2   loratadine (CLARITIN) 10 MG tablet Take 1 tablet (10 mg total) by mouth daily. 30 tablet 6   Melatonin 10 MG SUBL Place 20 mg under the tongue at bedtime.     metFORMIN (GLUCOPHAGE-XR) 500 MG 24 hr tablet TAKE FOUR TABLETS BY MOUTH DAILY 360 tablet 3   metoprolol tartrate (LOPRESSOR) 25 MG tablet Take 1 tablet (25 mg total) by mouth 2 (two) times daily. 360 tablet 1   Multiple Vitamins-Minerals (MULTIVITAMIN WITH MINERALS) tablet Take 1 tablet by mouth daily.     pantoprazole (PROTONIX) 40 MG tablet TAKE ONE TABLET BY MOUTH TWICE A DAY BEFORE A MEAL 180 tablet 1   Semaglutide,0.25 or 0.5MG/DOS, 2 MG/3ML SOPN Inject 0.5 mg into the skin once a week. 3 mL 0   traZODone (DESYREL) 100 MG tablet Take 100 mg by mouth at bedtime.     TRUEplus Lancets 28G MISC use lancet to check blood glucose three times daily 100 each 3   No current facility-administered medications on file prior to visit.   Allergies  Allergen Reactions   Wound Dressing Adhesive Other (See Comments)    Redness/irritation   Codeine Nausea And Vomiting    Pt is able to take percocet & vicodin   Latex Rash    irritation   Family History  Problem Relation Age of Onset   Depression Mother    Diabetes Mother    Arthritis Mother    Heart disease Mother    Learning disabilities Mother    Mental illness Mother    Liver cancer Mother        metastatic from colon??   Arthritis Father    Prostate cancer Father        met to esophagus    Colon polyps Sister    Cancer Maternal Aunt        x 2 Aunts   PE: BP 114/76 (BP Location: Left Arm, Patient Position: Sitting, Cuff Size: Small)   Pulse 94   Ht 5' 1"  (1.549 m)   Wt 224 lb (101.6 kg)   LMP 03/13/2013   SpO2 94%   BMI 42.32 kg/m  Wt Readings from Last 3 Encounters:  02/06/22 224 lb (101.6 kg)  11/13/21 228 lb (103.4 kg)  10/06/21 228 lb 9.6 oz (103.7 kg)   Constitutional: overweight, in NAD Eyes:  EOMI, no exophthalmos ENT: no neck masses, no cervical lymphadenopathy Cardiovascular: tachycardia, RR, No MRG Respiratory: CTA B Musculoskeletal: no deformities Skin:no rashes Neurological: no tremor with outstretched hands Diabetic Foot Exam - Simple   Simple Foot Form Diabetic Foot exam was performed with the following findings: Yes 02/06/2022 10:40 AM  Visual Inspection No deformities, no ulcerations, no other skin breakdown bilaterally: Yes Sensation Testing Intact to touch and monofilament testing bilaterally: Yes Pulse Check Posterior Tibialis and Dorsalis pulse intact bilaterally: Yes Comments    ASSESSMENT: 1. DM2, insulin-dependent, uncontrolled, with complications - PN  2. HL  PLAN:  1. Patient with longstanding, uncontrolled, type 2 diabetes, on oral antidiabetic regimen with metformin ER, also, intermediate acting insulin and weekly GLP-1 receptor agonist with stable HbA1c at last visit, but still above target, at 7.3%.  At that time, blood sugars were still above target in the morning and mostly at goal after dinner.  However, the morning sugars improved significantly in the previous months.  At that time I advised her to  increase the dose of Ozempic and decrease the dose of NPH insulin.  She did have gas, eructations, and diarrhea in the past after increasing Ozempic and we discussed about trying Mylanta. -At this visit, however, she tells me that she did not have to take Mylanta since she tolerated the higher dose of Ozempic well.  In fact, she  would be interested in increasing the dose that she does not feel that Ozempic is reducing her appetite sufficiently. -Reviewing her blood sugars at home, they appear to be improved, with occasional lower values in the 50s.  Therefore, at today's visit, I advised her to reduce the dose of NPH further.  In the meantime, we will go ahead and treat the Ozempic dose.  I did advise her that if the sugars are higher after the reduction of the NPH dose, she may try to split the dose 12 hours apart.  I advised her to try a 70%-30% regimen, or, if sugars remain high in the morning, 50-50% regimen. - I suggested to:  Patient Instructions  Please continue: - Metformin ER 1000 mg 2x a day, with meals  Please increase: - Ozempic 1 mg weekly   Please reduce: - NPH 100 units in am  Please return in 3-4 months.   - we checked her HbA1c: 6.6% (improved) - advised to check sugars at different times of the day - 2x a day, rotating check times - advised for yearly eye exams >> she is not UTD - return to clinic in 3-4 months  2. HL -Reviewed latest lipid panel from 02/2021: LDL at goal, triglycerides slightly high, HDL slightly low: Lab Results  Component Value Date   CHOL 87 (L) 03/13/2021   HDL 33 (L) 03/13/2021   LDLCALC 25 03/13/2021   LDLDIRECT 95.5 12/02/2019   TRIG 178 (H) 03/13/2021   CHOLHDL 2.6 03/13/2021  -She continues on Lipitor 80 mg daily and Tricor 48 mg daily without side effects  Philemon Kingdom, MD PhD [facility] Endocrinology

## 2022-02-06 NOTE — Patient Instructions (Addendum)
Please continue: - Metformin ER 1000 mg 2x a day, with meals  Please increase: - Ozempic 1 mg weekly   Please reduce: - NPH 100 units in am  Please return in 3-4 months.

## 2022-02-10 ENCOUNTER — Other Ambulatory Visit: Payer: Self-pay

## 2022-02-10 ENCOUNTER — Other Ambulatory Visit: Payer: Self-pay | Admitting: Internal Medicine

## 2022-02-10 DIAGNOSIS — Z1231 Encounter for screening mammogram for malignant neoplasm of breast: Secondary | ICD-10-CM

## 2022-02-17 ENCOUNTER — Other Ambulatory Visit: Payer: Self-pay

## 2022-02-24 ENCOUNTER — Other Ambulatory Visit: Payer: Self-pay

## 2022-02-24 ENCOUNTER — Other Ambulatory Visit: Payer: Self-pay | Admitting: Internal Medicine

## 2022-02-24 DIAGNOSIS — E1142 Type 2 diabetes mellitus with diabetic polyneuropathy: Secondary | ICD-10-CM

## 2022-02-24 MED ORDER — TRUE METRIX BLOOD GLUCOSE TEST VI STRP
ORAL_STRIP | 3 refills | Status: DC
  Filled 2022-02-24: qty 100, 33d supply, fill #0
  Filled 2022-04-13: qty 100, 33d supply, fill #1
  Filled 2022-05-12: qty 100, 33d supply, fill #2

## 2022-02-24 MED ORDER — TRUEPLUS LANCETS 28G MISC
3 refills | Status: DC
  Filled 2022-02-24: qty 100, 33d supply, fill #0
  Filled 2022-04-13: qty 100, 33d supply, fill #1

## 2022-02-25 ENCOUNTER — Ambulatory Visit: Payer: Self-pay

## 2022-02-25 ENCOUNTER — Other Ambulatory Visit: Payer: Self-pay

## 2022-02-25 ENCOUNTER — Telehealth: Payer: Self-pay | Admitting: Physician Assistant

## 2022-02-25 DIAGNOSIS — R42 Dizziness and giddiness: Secondary | ICD-10-CM

## 2022-02-25 NOTE — Telephone Encounter (Signed)
Chief Complaint: dizziness,not currently Symptoms: room spinning, nausea with dizziness Frequency: Onset 2 days ago, off and on Pertinent Negatives: Patient denies other symptoms Disposition: '[]'$ ED /'[]'$ Urgent Care (no appt availability in office) / '[]'$ Appointment(In office/virtual)/ '[x]'$  [facility]/ '[]'$ Home Care/ '[]'$ Refused Recommended Disposition /'[]'$ [facility] [facility]/ '[]'$  Follow-up with PCP Additional Notes: Patient says she woke up 2 days ago and stood up, fell back on the bed due to room spinning and it lasted a couple of hours, it passed so she went to work. That same evening at home same thing, room spinning, then passed. Next day same thing after bending over. It's random bouts of dizziness she says. She wants to be seen in office. Advised no available appointments on schedule for any provider until mid November, offered virtual UC or Mobile Bus tomorrow in [place]. She says she feels like she should be coming in to the office to have her BP checked, because that couldn't be done virtually, if it's needed. She asks about sick visits. I reached out to [place] about sick visits, she says the schedules are overbooked and no walkin visits per Dr. Margarita Rana. Patient advised, offered virtual UC visit again, she agreed. Scheduled for 1015 today.     Reason for Disposition  [1] MODERATE dizziness (e.g., interferes with normal activities) AND [2] has NOT been evaluated by doctor (or NP/PA) for this  (Exception: Dizziness caused by heat exposure, sudden standing, or poor fluid intake.)  Answer Assessment - Initial Assessment Questions 1. DESCRIPTION: "Describe your dizziness."     Room spinning 2. LIGHTHEADED: "Do you feel lightheaded?" (e.g., somewhat faint, woozy, weak upon standing)     No 3. VERTIGO: "Do you feel like either you or the room is spinning or tilting?" (i.e. vertigo)     Yes 4. SEVERITY: "How bad is it?"  "Do you feel like you are going to faint?" "Can you stand and walk?"    - MILD: Feels slightly dizzy, but walking normally.   - MODERATE: Feels unsteady when walking, but not falling; interferes with normal activities (e.g., school, work).   - SEVERE: Unable to walk without falling, or requires assistance to walk without falling; feels like passing out now.      Moderate 5. ONSET:  "When did the dizziness begin?"     2 days ago 6. AGGRAVATING FACTORS: "Does anything make it worse?" (e.g., standing, change in head position)     Standing, bending over 7. HEART RATE: "Can you tell me your heart rate?" "How many beats in 15 seconds?"  (Note: not all patients can do this)       N/A 8. CAUSE: "What do you think is causing the dizziness?"     Maybe medication related, not sure 9. RECURRENT SYMPTOM: "Have you had dizziness before?" If Yes, ask: "When was the last time?" "What happened that time?"     No 10. OTHER SYMPTOMS: "Do you have any other symptoms?" (e.g., fever, chest pain, vomiting, diarrhea, bleeding)       Nasal congestion off and on a couple weeks (allergies), nausea with dizziness 11. PREGNANCY: "Is there any chance you are pregnant?" "When was your last menstrual period?"       No  Protocols used: Dizziness - Lightheadedness-A-AH

## 2022-02-25 NOTE — Patient Instructions (Signed)
Teresa Camacho, thank you for joining Teresa Booze, PA-C for today's virtual visit.  While this provider is not your primary care provider (PCP), if your PCP is located in our provider database this encounter information will be shared with them immediately following your visit.  Consent: (Patient) Teresa Camacho provided verbal consent for this virtual visit at the beginning of the encounter.  Current Medications:  Current Outpatient Medications:    albuterol (VENTOLIN HFA) 108 (90 Base) MCG/ACT inhaler, Inhale 2 puffs into the lungs every 6 (six) hours as needed for wheezing or shortness of breath., Disp: 8 g, Rfl: 0   aspirin EC 81 MG tablet, Take 1 tablet (81 mg total) by mouth daily. Swallow whole. (Patient taking differently: Take 81 mg by mouth in the morning. Swallow whole.), Disp: 100 tablet, Rfl: 2   atorvastatin (LIPITOR) 80 MG tablet, Take 1 tablet (80 mg total) by mouth daily., Disp: 90 tablet, Rfl: 2   blood glucose meter kit and supplies KIT, Dispense based on patient and insurance preference. Use up to four times daily as directed. (FOR ICD-9 250.00, 250.01)., Disp: 1 each, Rfl: 0   Blood Glucose Monitoring Suppl (TRUE METRIX METER) w/Device KIT, use kit to check blood glucose three times daily, Disp: 1 kit, Rfl: 0   Cholecalciferol (VITAMIN D-3) 125 MCG (5000 UT) TABS, Take 5,000 Units by mouth in the morning., Disp: , Rfl:    dapagliflozin propanediol (FARXIGA) 5 MG TABS tablet, Take 1 tablet (5 mg total) by mouth daily., Disp: 90 tablet, Rfl: 3   desloratadine (CLARINEX) 5 MG tablet, Take 5 mg by mouth at bedtime., Disp: , Rfl:    diphenhydrAMINE (BENADRYL) 25 mg capsule, Take 50 mg by mouth at bedtime., Disp: , Rfl:    fenofibrate (TRICOR) 48 MG tablet, Take 1 tablet (48 mg total) by mouth daily., Disp: 90 tablet, Rfl: 2   gabapentin (NEURONTIN) 300 MG capsule, Take 2 capsules (600 mg total) by mouth at bedtime., Disp: 60 capsule, Rfl: 5   glucose blood (TRUE METRIX BLOOD  GLUCOSE TEST) test strip, use one test strip to check blood glucose three times a day, Disp: 100 strip, Rfl: 3   Insulin NPH, Human,, Isophane, (HUMULIN N KWIKPEN) 100 UNIT/ML Kiwkpen, Inject 100 Units into the skin every morning., Disp: 30 mL, Rfl: 3   Insulin Pen Needle 32G X 4 MM MISC, Use in the morning and at bedtime, Disp: 100 each, Rfl: 1   lisinopril (ZESTRIL) 10 MG tablet, Take 1 tablet (10 mg total) by mouth daily., Disp: 90 tablet, Rfl: 2   loratadine (CLARITIN) 10 MG tablet, Take 1 tablet (10 mg total) by mouth daily., Disp: 30 tablet, Rfl: 6   Melatonin 10 MG SUBL, Place 20 mg under the tongue at bedtime., Disp: , Rfl:    metFORMIN (GLUCOPHAGE-XR) 500 MG 24 hr tablet, TAKE FOUR TABLETS BY MOUTH DAILY, Disp: 360 tablet, Rfl: 3   metoprolol tartrate (LOPRESSOR) 25 MG tablet, Take 1 tablet (25 mg total) by mouth 2 (two) times daily., Disp: 360 tablet, Rfl: 1   Multiple Vitamins-Minerals (MULTIVITAMIN WITH MINERALS) tablet, Take 1 tablet by mouth daily., Disp: , Rfl:    pantoprazole (PROTONIX) 40 MG tablet, TAKE ONE TABLET BY MOUTH TWICE A DAY BEFORE A MEAL, Disp: 180 tablet, Rfl: 1   Semaglutide, 1 MG/DOSE, (OZEMPIC, 1 MG/DOSE,) 4 MG/3ML SOPN, Inject 1 mg into the skin once a week., Disp: 9 mL, Rfl: 3   traZODone (DESYREL) 100 MG tablet,  Take 100 mg by mouth at bedtime., Disp: , Rfl:    TRUEplus Lancets 28G MISC, use lancet to check blood glucose three times daily, Disp: 100 each, Rfl: 3   Medications ordered in this encounter:  No orders of the defined types were placed in this encounter.    *If you need refills on other medications prior to your next appointment, please contact your pharmacy*  Follow-Up: Call back or seek an in-person evaluation if the symptoms worsen or if the condition fails to improve as anticipated.  [facility] [phone]  Other Instructions Please seek evaluation at your nearest urgent care   If you have been instructed to have an  in-person evaluation today at a local Urgent Care facility, please use the link below. It will take you to a list of all of our available [facility], including address, phone number and hours of operation. Please do not delay care.  [facility]  If you or a family member do not have a primary care provider, use the link below to schedule a visit and establish care. When you choose a [facility] primary care physician or advanced practice provider, you gain a long-term partner in health. Find a Primary Care Provider  Learn more about [facility]'s in-office and virtual care options: [facility]

## 2022-02-25 NOTE — Progress Notes (Signed)
Virtual Visit Consent   Teresa Camacho, you are scheduled for a virtual visit with a [facility] provider today. Just as with appointments in the office, your consent must be obtained to participate. Your consent will be active for this visit and any virtual visit you may have with one of our providers in the next 365 days. If you have a MyChart account, a copy of this consent can be sent to you electronically.  As this is a virtual visit, video technology does not allow for your provider to perform a traditional examination. This may limit your provider's ability to fully assess your condition. If your provider identifies any concerns that need to be evaluated in person or the need to arrange testing (such as labs, EKG, etc.), we will make arrangements to do so. Although advances in technology are sophisticated, we cannot ensure that it will always work on either your end or our end. If the connection with a video visit is poor, the visit may have to be switched to a telephone visit. With either a video or telephone visit, we are not always able to ensure that we have a secure connection.  By engaging in this virtual visit, you consent to the provision of healthcare and authorize for your insurance to be billed (if applicable) for the services provided during this visit. Depending on your insurance coverage, you may receive a charge related to this service.  I need to obtain your verbal consent now. Are you willing to proceed with your visit today? Teresa Camacho has provided verbal consent on 02/25/2022 for a virtual visit (video or telephone). Teresa Camacho, [place]  Date: 02/25/2022 10:32 AM  Virtual Visit via Video Note   I, Teresa Camacho, connected with  Teresa Camacho  ([id number], Jun 07, 1965) on 02/25/22 at 10:15 AM EDT by a video-enabled telemedicine application and verified that I am speaking with the correct person using two identifiers.  Location: Patient: Virtual Visit Location Patient:  Home Provider: Virtual Visit Location Provider: Home Office   I discussed the limitations of evaluation and management by telemedicine and the availability of in person appointments. The patient expressed understanding and agreed to proceed.    History of Present Illness: Teresa Camacho is a 56 y.o. who identifies as a female who was assigned female at birth, and is being seen today for dizziness.  Pt reports dizziness that started 2 days ago. She woke up with dizziness that she describes as a room spinning sensation. States this caused to her to fall back onto her bed when her symptoms first started. She states sxs have been intermittent and sxs have been severe. Her symptoms seemed to improved throughout the day and then later in the day the symptoms returned when going to bed. Similarly, her symptoms seemed to wax and wane yesterday. She had another episode which caused her to fall.   Symptoms have occurred at rest but do seem to mostly be provoked by movement.  She denies head trauma or loc. Denies speech or visual problems. Denies unilateral numbness/weakness. She reports seasonal allergies but does not feel that she has had severe allergy symptoms recently.   She denies any recent medication changes.   HPI: HPI  Problems:  Patient Active Problem List   Diagnosis Date Noted   Colon cancer screening    Benign neoplasm of transverse colon    Dysphagia    Esophageal stricture    COVID-19 vaccine regimen to maintain immunity completed 05/31/2020  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   History of allergy 12/26/2019   OSA on CPAP 12/26/2019   Class 3 severe obesity due to excess calories with serious comorbidity and body mass index (BMI) of 40.0 to 44.9 in adult [facility]) 12/26/2019   Hyperlipidemia associated with type 2 diabetes mellitus ([place]) 12/26/2019   Essential hypertension 12/26/2019   Family history of colonic polyps 12/26/2019   Gastroesophageal reflux disease 12/26/2019   Type 2  diabetes mellitus with hyperglycemia, with long-term current use of insulin ([place]) 11/30/2019   Iron deficiency anemia due to chronic blood loss 06/28/2013   S/P hysterectomy 06/13/2013    Allergies:  Allergies  Allergen Reactions   Wound Dressing Adhesive Other (See Comments)    Redness/irritation   Codeine Nausea And Vomiting    Pt is able to take percocet & vicodin   Latex Rash    irritation   Medications:  Current Outpatient Medications:    albuterol (VENTOLIN HFA) 108 (90 Base) MCG/ACT inhaler, Inhale 2 puffs into the lungs every 6 (six) hours as needed for wheezing or shortness of breath., Disp: 8 g, Rfl: 0   aspirin EC 81 MG tablet, Take 1 tablet (81 mg total) by mouth daily. Swallow whole. (Patient taking differently: Take 81 mg by mouth in the morning. Swallow whole.), Disp: 100 tablet, Rfl: 2   atorvastatin (LIPITOR) 80 MG tablet, Take 1 tablet (80 mg total) by mouth daily., Disp: 90 tablet, Rfl: 2   blood glucose meter kit and supplies KIT, Dispense based on patient and insurance preference. Use up to four times daily as directed. (FOR ICD-9 250.00, 250.01)., Disp: 1 each, Rfl: 0   Blood Glucose Monitoring Suppl (TRUE METRIX METER) w/Device KIT, use kit to check blood glucose three times daily, Disp: 1 kit, Rfl: 0   Cholecalciferol (VITAMIN D-3) 125 MCG (5000 UT) TABS, Take 5,000 Units by mouth in the morning., Disp: , Rfl:    dapagliflozin propanediol (FARXIGA) 5 MG TABS tablet, Take 1 tablet (5 mg total) by mouth daily., Disp: 90 tablet, Rfl: 3   desloratadine (CLARINEX) 5 MG tablet, Take 5 mg by mouth at bedtime., Disp: , Rfl:    diphenhydrAMINE (BENADRYL) 25 mg capsule, Take 50 mg by mouth at bedtime., Disp: , Rfl:    fenofibrate (TRICOR) 48 MG tablet, Take 1 tablet (48 mg total) by mouth daily., Disp: 90 tablet, Rfl: 2   gabapentin (NEURONTIN) 300 MG capsule, Take 2 capsules (600 mg total) by mouth at bedtime., Disp: 60 capsule, Rfl: 5   glucose blood (TRUE METRIX BLOOD  GLUCOSE TEST) test strip, use one test strip to check blood glucose three times a day, Disp: 100 strip, Rfl: 3   Insulin NPH, Human,, Isophane, (HUMULIN N KWIKPEN) 100 UNIT/ML Kiwkpen, Inject 100 Units into the skin every morning., Disp: 30 mL, Rfl: 3   Insulin Pen Needle 32G X 4 MM MISC, Use in the morning and at bedtime, Disp: 100 each, Rfl: 1   lisinopril (ZESTRIL) 10 MG tablet, Take 1 tablet (10 mg total) by mouth daily., Disp: 90 tablet, Rfl: 2   loratadine (CLARITIN) 10 MG tablet, Take 1 tablet (10 mg total) by mouth daily., Disp: 30 tablet, Rfl: 6   Melatonin 10 MG SUBL, Place 20 mg under the tongue at bedtime., Disp: , Rfl:    metFORMIN (GLUCOPHAGE-XR) 500 MG 24 hr tablet, TAKE FOUR TABLETS BY MOUTH DAILY, Disp: 360 tablet, Rfl: 3   metoprolol tartrate (LOPRESSOR) 25 MG tablet, Take 1 tablet (25 mg  total) by mouth 2 (two) times daily., Disp: 360 tablet, Rfl: 1   Multiple Vitamins-Minerals (MULTIVITAMIN WITH MINERALS) tablet, Take 1 tablet by mouth daily., Disp: , Rfl:    pantoprazole (PROTONIX) 40 MG tablet, TAKE ONE TABLET BY MOUTH TWICE A DAY BEFORE A MEAL, Disp: 180 tablet, Rfl: 1   Semaglutide, 1 MG/DOSE, (OZEMPIC, 1 MG/DOSE,) 4 MG/3ML SOPN, Inject 1 mg into the skin once a week., Disp: 9 mL, Rfl: 3   traZODone (DESYREL) 100 MG tablet, Take 100 mg by mouth at bedtime., Disp: , Rfl:    TRUEplus Lancets 28G MISC, use lancet to check blood glucose three times daily, Disp: 100 each, Rfl: 3  Observations/Objective: Patient is well-developed, well-nourished in no acute distress.  Resting comfortably  at home.  Head is normocephalic, atraumatic.  No labored breathing.  Speech is clear and coherent with logical content. No facial droop. No aphasia. Patient is alert and oriented at baseline.    Assessment and Plan: 1. Dizziness  Advised pt to seek eval at urgent care. Though her symptoms do sound most likely peripheral in nature, she has several risk factors for stroke and will need to  have a full neurologic evaluation. She is agreeable with this plan.  Follow Up Instructions: I discussed the assessment and treatment plan with the patient. The patient was provided an opportunity to ask questions and all were answered. The patient agreed with the plan and demonstrated an understanding of the instructions.  A copy of instructions were sent to the patient via MyChart unless otherwise noted below.   The patient was advised to call back or seek an in-person evaluation if the symptoms worsen or if the condition fails to improve as anticipated.  Time:  I spent 15 minutes with the patient via telehealth technology discussing the above problems/concerns.    Nisaiah Bechtol Gilman Schmidt, PA-C

## 2022-02-26 ENCOUNTER — Ambulatory Visit
Admission: RE | Admit: 2022-02-26 | Discharge: 2022-02-26 | Disposition: A | Discharge status: Home / Self Care | Payer: No Typology Code available for payment source | Source: Ambulatory Visit | Attending: Internal Medicine | Admitting: Internal Medicine

## 2022-02-26 DIAGNOSIS — Z1231 Encounter for screening mammogram for malignant neoplasm of breast: Secondary | ICD-10-CM

## 2022-03-16 ENCOUNTER — Ambulatory Visit: Payer: No Typology Code available for payment source | Attending: Internal Medicine | Admitting: Internal Medicine

## 2022-03-16 ENCOUNTER — Encounter: Payer: Self-pay | Admitting: Internal Medicine

## 2022-03-16 ENCOUNTER — Other Ambulatory Visit: Payer: Self-pay

## 2022-03-16 VITALS — BP 110/75 | HR 73 | Wt 222.6 lb

## 2022-03-16 DIAGNOSIS — I152 Hypertension secondary to endocrine disorders: Secondary | ICD-10-CM

## 2022-03-16 DIAGNOSIS — E785 Hyperlipidemia, unspecified: Secondary | ICD-10-CM

## 2022-03-16 DIAGNOSIS — E1169 Type 2 diabetes mellitus with other specified complication: Secondary | ICD-10-CM

## 2022-03-16 DIAGNOSIS — E1142 Type 2 diabetes mellitus with diabetic polyneuropathy: Secondary | ICD-10-CM

## 2022-03-16 DIAGNOSIS — H8110 Benign paroxysmal vertigo, unspecified ear: Secondary | ICD-10-CM

## 2022-03-16 DIAGNOSIS — E1159 Type 2 diabetes mellitus with other circulatory complications: Secondary | ICD-10-CM

## 2022-03-16 DIAGNOSIS — G4733 Obstructive sleep apnea (adult) (pediatric): Secondary | ICD-10-CM

## 2022-03-16 DIAGNOSIS — Z23 Encounter for immunization: Secondary | ICD-10-CM

## 2022-03-16 MED ORDER — MECLIZINE HCL 12.5 MG PO TABS
12.5000 mg | ORAL_TABLET | Freq: Two times a day (BID) | ORAL | 0 refills | Status: DC | PRN
  Filled 2022-03-16: qty 30, 15d supply, fill #0

## 2022-03-16 NOTE — Patient Instructions (Signed)
GO SLOW with position changes. I have referred you for physical therapy called vestibular training to help reduce the dizzy spells.   Benign Positional Vertigo Vertigo is the feeling that you or your surroundings are moving when they are not. Benign positional vertigo is the most common form of vertigo. This is usually a harmless condition (benign). This condition is positional. This means that symptoms are triggered by certain movements and positions. This condition can be dangerous if it occurs while you are doing something that could cause harm to yourself or others. This includes activities such as driving or operating machinery. What are the causes? The inner ear has fluid-filled canals that help your brain sense movement and balance. When the fluid moves, the brain receives messages about your body's position. With benign positional vertigo, calcium crystals in the inner ear break free and disturb the inner ear area. This causes your brain to receive confusing messages about your body's position. What increases the risk? You are more likely to develop this condition if: You are a woman. You are 7 years of age or older. You have recently had a head injury. You have an inner ear disease. What are the signs or symptoms? Symptoms of this condition usually happen when you move your head or your eyes in different directions. Symptoms may start suddenly and usually last for less than a minute. They include: Loss of balance and falling. Feeling like you are spinning or moving. Feeling like your surroundings are spinning or moving. Nausea and vomiting. Blurred vision. Dizziness. Involuntary eye movement (nystagmus). Symptoms can be mild and cause only minor problems, or they can be severe and interfere with daily life. Episodes of benign positional vertigo may return (recur) over time. Symptoms may also improve over time. How is this diagnosed? This condition may be diagnosed based on: Your  medical history. A physical exam of the head, neck, and ears. Positional tests to check for or stimulate vertigo. You may be asked to turn your head and change positions, such as going from sitting to lying down. A health care provider will watch for symptoms of vertigo. You may be referred to a health care provider who specializes in ear, nose, and throat problems (ENT or otolaryngologist) or a provider who specializes in disorders of the nervous system (neurologist). How is this treated?  This condition may be treated in a session in which your health care provider moves your head in specific positions to help the displaced crystals in your inner ear move. Treatment for this condition may take several sessions. Surgery may be needed in severe cases, but this is rare. In some cases, benign positional vertigo may resolve on its own in 2-4 weeks. Follow these instructions at home: Safety Move slowly. Avoid sudden body or head movements or certain positions, as told by your health care provider. Avoid driving or operating machinery until your health care provider says it is safe. Avoid doing any tasks that would be dangerous to you or others if vertigo occurs. If you have trouble walking or keeping your balance, try using a cane for stability. If you feel dizzy or unstable, sit down right away. Return to your normal activities as told by your health care provider. Ask your health care provider what activities are safe for you. General instructions Take over-the-counter and prescription medicines only as told by your health care provider. Drink enough fluid to keep your urine pale yellow. Keep all follow-up visits. This is important. Contact a health care  provider if: You have a fever. Your condition gets worse or you develop new symptoms. Your family or friends notice any behavioral changes. You have nausea or vomiting that gets worse. You have numbness or a prickling and tingling  sensation. Get help right away if you: Have difficulty speaking or moving. Are always dizzy or faint. Develop severe headaches. Have weakness in your legs or arms. Have changes in your hearing or vision. Develop a stiff neck. Develop sensitivity to light. These symptoms may represent a serious problem that is an emergency. Do not wait to see if the symptoms will go away. Get medical help right away. Call your local emergency services (911 in the [place] Do not drive yourself to the hospital. Summary Vertigo is the feeling that you or your surroundings are moving when they are not. Benign positional vertigo is the most common form of vertigo. This condition is caused by calcium crystals in the inner ear that become displaced. This causes a disturbance in an area of the inner ear that helps your brain sense movement and balance. Symptoms include loss of balance and falling, feeling that you or your surroundings are moving, nausea and vomiting, and blurred vision. This condition can be diagnosed based on symptoms, a physical exam, and positional tests. Follow safety instructions as told by your health care provider and keep all follow-up visits. This is important. This information is not intended to replace advice given to you by your health care provider. Make sure you discuss any questions you have with your health care provider. Document Revised: 04/03/2020 Document Reviewed: 04/03/2020 Elsevier Patient Education  [place].

## 2022-03-16 NOTE — Progress Notes (Signed)
Patient ID: Teresa Camacho, female    DOB: 1965/07/18  MRN: [id number]  CC: chronic ds management  Subjective: Teresa Camacho is a 56 y.o. female who presents for chronic ds management Her concerns today include:  Patient with history of DM type II with microalbumin, HTN, HL, OSA on CPAP, GERD,PCOS, pattern baldness, RLS (Gabapentin).    Since last visit with me, patient has been having some dizzy spells that started 3 wks ago -rolled out of bed one morning and felt dizzy.  Got back in bed and it last 30 mins.  Worse when she rolled over in bed -since then, every morning when she first gets out of bed and every night when she rolls over in bed she has dizziness that last less than 1 min. Also when she is laying back in a chair and stands up she feels dizzy.   -drinks adequate fluid during the day -Had video visit with an APP 02/25/2022.  Assessed to have dizziness likely peripheral in nature but given her risk factors for CVA, she was advised to be seen at ER.  Patient did not pursue it because she did not want the long wait time  DM/obesity: Saw her endocrinologist 02/06/2022.  A1c at that time was 6.6. -Ozempic increased to 1 mg daily, NPH insulin decreased to 100 units every morning and metformin 1 g twice a day continued. Does not feel Ozempic decreases appetite as yet Walks her dog 2x/day for 20 mins Down 6 lbs since 10/2021  HL: Reports compliance with and tolerating atorvastatin 80 mg daily and Tricor 48 mg daily.  No significant muscle aches.  HTN: Reports compliance with lisinopril 10 mg daily and metoprolol 25 mg twice a day. Does not check BP, no device Limits salt  OSA:  got new CPAP since last visit with me.  Using and tolerating it  HM: due for flu shot and DM eye exam.  No insurance.   Patient Active Problem List   Diagnosis Date Noted   Colon cancer screening    Benign neoplasm of transverse colon    Dysphagia    Esophageal stricture    COVID-19 vaccine regimen to  maintain immunity completed 05/31/2020   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   History of allergy 12/26/2019   OSA on CPAP 12/26/2019   Class 3 severe obesity due to excess calories with serious comorbidity and body mass index (BMI) of 40.0 to 44.9 in adult [facility]) 12/26/2019   Hyperlipidemia associated with type 2 diabetes mellitus ([place]) 12/26/2019   Essential hypertension 12/26/2019   Family history of colonic polyps 12/26/2019   Gastroesophageal reflux disease 12/26/2019   Type 2 diabetes mellitus with hyperglycemia, with long-term current use of insulin ([place]) 11/30/2019   Iron deficiency anemia due to chronic blood loss 06/28/2013   S/P hysterectomy 06/13/2013     Current Outpatient Medications on File Prior to Visit  Medication Sig Dispense Refill   albuterol (VENTOLIN HFA) 108 (90 Base) MCG/ACT inhaler Inhale 2 puffs into the lungs every 6 (six) hours as needed for wheezing or shortness of breath. 8 g 0   aspirin EC 81 MG tablet Take 1 tablet (81 mg total) by mouth daily. Swallow whole. (Patient taking differently: Take 81 mg by mouth in the morning. Swallow whole.) 100 tablet 2   atorvastatin (LIPITOR) 80 MG tablet Take 1 tablet (80 mg total) by mouth daily. 90 tablet 2   blood glucose meter kit and supplies KIT Dispense based on patient and  insurance preference. Use up to four times daily as directed. (FOR ICD-9 250.00, 250.01). 1 each 0   Blood Glucose Monitoring Suppl (TRUE METRIX METER) w/Device KIT use kit to check blood glucose three times daily 1 kit 0   Cholecalciferol (VITAMIN D-3) 125 MCG (5000 UT) TABS Take 5,000 Units by mouth in the morning.     dapagliflozin propanediol (FARXIGA) 5 MG TABS tablet Take 1 tablet (5 mg total) by mouth daily. 90 tablet 3   desloratadine (CLARINEX) 5 MG tablet Take 5 mg by mouth at bedtime.     diphenhydrAMINE (BENADRYL) 25 mg capsule Take 50 mg by mouth at bedtime.     fenofibrate (TRICOR) 48 MG tablet Take 1 tablet (48 mg total) by mouth daily.  90 tablet 2   gabapentin (NEURONTIN) 300 MG capsule Take 2 capsules (600 mg total) by mouth at bedtime. 60 capsule 5   glucose blood (TRUE METRIX BLOOD GLUCOSE TEST) test strip use one test strip to check blood glucose three times a day 100 strip 3   Insulin NPH, Human,, Isophane, (HUMULIN N KWIKPEN) 100 UNIT/ML Kiwkpen Inject 100 Units into the skin every morning. 30 mL 3   Insulin Pen Needle 32G X 4 MM MISC Use in the morning and at bedtime 100 each 1   lisinopril (ZESTRIL) 10 MG tablet Take 1 tablet (10 mg total) by mouth daily. 90 tablet 2   loratadine (CLARITIN) 10 MG tablet Take 1 tablet (10 mg total) by mouth daily. 30 tablet 6   Melatonin 10 MG SUBL Place 20 mg under the tongue at bedtime.     metFORMIN (GLUCOPHAGE-XR) 500 MG 24 hr tablet TAKE FOUR TABLETS BY MOUTH DAILY 360 tablet 3   metoprolol tartrate (LOPRESSOR) 25 MG tablet Take 1 tablet (25 mg total) by mouth 2 (two) times daily. 360 tablet 1   Multiple Vitamins-Minerals (MULTIVITAMIN WITH MINERALS) tablet Take 1 tablet by mouth daily.     pantoprazole (PROTONIX) 40 MG tablet TAKE ONE TABLET BY MOUTH TWICE A DAY BEFORE A MEAL 180 tablet 1   Semaglutide, 1 MG/DOSE, (OZEMPIC, 1 MG/DOSE,) 4 MG/3ML SOPN Inject 1 mg into the skin once a week. 9 mL 3   traZODone (DESYREL) 100 MG tablet Take 100 mg by mouth at bedtime.     TRUEplus Lancets 28G MISC use lancet to check blood glucose three times daily 100 each 3   No current facility-administered medications on file prior to visit.    Allergies  Allergen Reactions   Wound Dressing Adhesive Other (See Comments)    Redness/irritation   Codeine Nausea And Vomiting    Pt is able to take percocet & vicodin   Latex Rash    irritation    Social History   Socioeconomic History   Marital status: Divorced    Spouse name: Not on file   Number of children: 5   Years of education: Not on file   Highest education level: Not on file  Occupational History   Not on file  Tobacco Use    Smoking status: Never   Smokeless tobacco: Never  Vaping Use   Vaping Use: Never used  Substance and Sexual Activity   Alcohol use: No   Drug use: No   Sexual activity: Not Currently  Other Topics Concern   Not on file  Social History Narrative   Not on file   Social Determinants of Health   Financial Resource Strain: Not on file  Food Insecurity: Not on file  Transportation Needs: Not on file  Physical Activity: Not on file  Stress: Not on file  Social Connections: Not on file  Intimate Partner Violence: Not on file    Family History  Problem Relation Age of Onset   Depression Mother    Diabetes Mother    Arthritis Mother    Heart disease Mother    Learning disabilities Mother    Mental illness Mother    Liver cancer Mother        metastatic from colon??   Arthritis Father    Prostate cancer Father        met to esophagus   Colon polyps Sister    Cancer Maternal Aunt        x 2 Aunts   Breast cancer Maternal Grandmother     Past Surgical History:  Procedure Laterality Date   ABDOMINAL HYSTERECTOMY N/A 06/13/2013   Procedure: HYSTERECTOMY ABDOMINAL and removal of vaginal skin tag;  Surgeon: Luz Lex, MD;  Location: [facility];  Service: Gynecology;  Laterality: N/A;   APPENDECTOMY     BALLOON DILATION N/A 08/27/2020   Procedure: BALLOON DILATION;  Surgeon: Irene Shipper, MD;  Location: [facility];  Service: Endoscopy;  Laterality: N/A;   CESAREAN SECTION     3x   CHOLECYSTECTOMY     CHOLECYSTECTOMY  1991   COLONOSCOPY WITH PROPOFOL N/A 08/27/2020   Procedure: COLONOSCOPY WITH PROPOFOL;  Surgeon: Irene Shipper, MD;  Location: [facility];  Service: Endoscopy;  Laterality: N/A;   ESOPHAGOGASTRODUODENOSCOPY (EGD) WITH PROPOFOL N/A 08/27/2020   Procedure: ESOPHAGOGASTRODUODENOSCOPY (EGD) WITH PROPOFOL;  Surgeon: Irene Shipper, MD;  Location: [facility];  Service: Endoscopy;  Laterality: N/A;   POLYPECTOMY  08/27/2020   Procedure: POLYPECTOMY;  Surgeon: Irene Shipper, MD;  Location: [facility];  Service: Endoscopy;;    ROS: Review of Systems Negative except as stated above  PHYSICAL EXAM: BP 110/75 (BP Location: Left Arm, Patient Position: Sitting, Cuff Size: Large)   Pulse 73   Wt 222 lb 9.6 oz (101 kg)   LMP 03/13/2013   SpO2 97%   BMI 42.06 kg/m   Wt Readings from Last 3 Encounters:  03/16/22 222 lb 9.6 oz (101 kg)  02/06/22 224 lb (101.6 kg)  11/13/21 228 lb (103.4 kg)  Sitting: BP 98/68, P80 Standing: BP 108/74, P79  Physical Exam  General appearance - alert, well appearing, obese middle-age Caucasian female and in no distress Mental status - normal mood, behavior, speech, dress, motor activity, and thought processes Eyes -pink conjunctiva Mouth -oral mucosa is moist Neck - supple, no significant adenopathy Chest - clear to auscultation, no wheezes, rales or rhonchi, symmetric air entry Heart - normal rate, regular rhythm, normal S1, S2, no murmurs, rubs, clicks or gallops Neurological - cranial nerves II through XII intact, normal muscle tone, no tremors, strength 5/5, Romberg sign negative, normal gait and station.  Gross sensation intact.  Attempted to do the Dix-Hallpike maneuver on her but patient became too dizzy.  Nystagmus was mixed upbeat-torsional Extremities - peripheral pulses normal, no pedal edema, no clubbing or cyanosis      Latest Ref Rng & Units 03/13/2021   11:08 AM 12/02/2019    6:48 AM 12/01/2019    5:37 AM  CMP  Glucose 70 - 99 mg/dL 163  323  329   BUN 6 - 24 mg/dL _0 Creatinine 0.57 - 1.00 mg/dL 0.73  0.49  0.58   Sodium  134 - 144 mmol/L 140  137  137   Potassium 3.5 - 5.2 mmol/L 4.6  3.8  3.9   Chloride 96 - 106 mmol/L 103  103  102   CO2 20 - 29 mmol/L _0 Calcium 8.7 - 10.2 mg/dL 10.4  8.9  8.6   Total Protein 6.0 - 8.5 g/dL 7.2     Total Bilirubin 0.0 - 1.2 mg/dL 0.9     Alkaline Phos 44 - 121 IU/L 81     AST 0 - 40 IU/L 26     ALT 0 - 32 IU/L 36      Lipid Panel      Component Value Date/Time   CHOL 87 (L) 03/13/2021 1108   TRIG 178 (H) 03/13/2021 1108   HDL 33 (L) 03/13/2021 1108   CHOLHDL 2.6 03/13/2021 1108   CHOLHDL 8.1 12/02/2019 0648   VLDL UNABLE TO CALCULATE IF TRIGLYCERIDE OVER 400 mg/dL 12/02/2019 0648   LDLCALC 25 03/13/2021 1108   LDLDIRECT 95.5 12/02/2019 0648    CBC    Component Value Date/Time   WBC 10.5 03/13/2021 1108   WBC 7.9 12/01/2019 0537   RBC 5.40 (H) 03/13/2021 1108   RBC 5.08 12/01/2019 0537   HGB 15.1 03/13/2021 1108   HCT 45.6 03/13/2021 1108   PLT 340 03/13/2021 1108   MCV 84 03/13/2021 1108   MCH 28.0 03/13/2021 1108   MCH 28.7 12/01/2019 0537   MCHC 33.1 03/13/2021 1108   MCHC 32.4 12/01/2019 0537   RDW 12.9 03/13/2021 1108   LYMPHSABS 3.2 06/14/2013 2217   MONOABS 1.0 06/14/2013 2217   EOSABS 0.4 06/14/2013 2217   BASOSABS 0.0 06/14/2013 2217    ASSESSMENT AND PLAN: 1. Benign paroxysmal positional vertigo, unspecified laterality -We will refer to physical therapy for vestibular training.  Low-dose meclizine given to use as needed.  Advised to go slow with position changes. - meclizine (ANTIVERT) 12.5 MG tablet; Take 1 tablet (12.5 mg total) by mouth 2 (two) times daily as needed for dizziness.  Dispense: 30 tablet; Refill: 0 - PT Vestibular Evaluation; Future  2. Type 2 diabetes mellitus with peripheral neuropathy (HCC) At goal on current medications and followed by endocrinology. - CBC - Comprehensive metabolic panel - Microalbumin / creatinine urine ratio  3. Hypertension associated with diabetes ([place]) At goal.  Continue lisinopril 10 mg daily and metoprolol 25 mg twice a day.  4. Hyperlipidemia associated with type 2 diabetes mellitus ([place]) Due for lipid profile.  Continue atorvastatin 80 mg daily and Tricor 48 mg - Lipid panel  5. OSA on CPAP Doing well on and tolerating her new CPAP machine  6. Need for immunization against influenza - Flu Vaccine QUAD 47moIM (Fluarix, Fluzone &  Alfiuria Quad PF)    Patient was given the opportunity to ask questions.  Patient verbalized understanding of the plan and was able to repeat key elements of the plan.   This documentation was completed using DRadio producer  Any transcriptional errors are unintentional.  Orders Placed This Encounter  Procedures   Flu Vaccine QUAD 667moM (Fluarix, Fluzone & Alfiuria Quad PF)   CBC   Comprehensive metabolic panel   Lipid panel   Microalbumin / creatinine urine ratio   PT Vestibular Evaluation     Requested Prescriptions   Signed Prescriptions Disp Refills   meclizine (ANTIVERT) 12.5 MG tablet 30 tablet 0    Sig: Take 1 tablet (12.5 mg total) by mouth  2 (two) times daily as needed for dizziness.    Return in about 4 months (around 07/16/2022).  Karle Plumber, MD, FACP

## 2022-03-17 LAB — CBC
Hematocrit: 44 % (ref 34.0–46.6)
Hemoglobin: 14.3 g/dL (ref 11.1–15.9)
MCH: 27.4 pg (ref 26.6–33.0)
MCHC: 32.5 g/dL (ref 31.5–35.7)
MCV: 85 fL (ref 79–97)
Platelets: 334 10*3/uL (ref 150–450)
RBC: 5.21 x10E6/uL (ref 3.77–5.28)
RDW: 13.9 % (ref 11.7–15.4)
WBC: 10.7 10*3/uL (ref 3.4–10.8)

## 2022-03-17 LAB — COMPREHENSIVE METABOLIC PANEL
ALT: 29 IU/L (ref 0–32)
AST: 23 IU/L (ref 0–40)
Albumin/Globulin Ratio: 2 (ref 1.2–2.2)
Albumin: 4.6 g/dL (ref 3.8–4.9)
Alkaline Phosphatase: 69 IU/L (ref 44–121)
BUN/Creatinine Ratio: 13 (ref 9–23)
BUN: 9 mg/dL (ref 6–24)
Bilirubin Total: 0.8 mg/dL (ref 0.0–1.2)
CO2: 22 mmol/L (ref 20–29)
Calcium: 9.7 mg/dL (ref 8.7–10.2)
Chloride: 103 mmol/L (ref 96–106)
Creatinine, Ser: 0.72 mg/dL (ref 0.57–1.00)
Globulin, Total: 2.3 g/dL (ref 1.5–4.5)
Glucose: 72 mg/dL (ref 70–99)
Potassium: 4.2 mmol/L (ref 3.5–5.2)
Sodium: 141 mmol/L (ref 134–144)
Total Protein: 6.9 g/dL (ref 6.0–8.5)
eGFR: 99 mL/min/{1.73_m2} (ref >59)

## 2022-03-17 LAB — LIPID PANEL
Chol/HDL Ratio: 1.8 ratio (ref 0.0–4.4)
Cholesterol, Total: 73 mg/dL — ABNORMAL LOW (ref 100–199)
HDL: 40 mg/dL (ref >39)
LDL Chol Calc (NIH): 15 mg/dL (ref 0–99)
Triglycerides: 87 mg/dL (ref 0–149)
VLDL Cholesterol Cal: 18 mg/dL (ref 5–40)

## 2022-03-17 LAB — MICROALBUMIN / CREATININE URINE RATIO
Creatinine, Urine: 96.8 mg/dL
Microalb/Creat Ratio: 25 mg/g creat (ref 0–29)
Microalbumin, Urine: 24 ug/mL

## 2022-04-02 ENCOUNTER — Other Ambulatory Visit: Payer: Self-pay | Admitting: Internal Medicine

## 2022-04-02 ENCOUNTER — Other Ambulatory Visit: Payer: Self-pay

## 2022-04-02 DIAGNOSIS — H8110 Benign paroxysmal vertigo, unspecified ear: Secondary | ICD-10-CM

## 2022-04-02 MED ORDER — MECLIZINE HCL 12.5 MG PO TABS
12.5000 mg | ORAL_TABLET | Freq: Two times a day (BID) | ORAL | 3 refills | Status: DC | PRN
  Filled 2022-04-02 – 2022-04-13 (×2): qty 60, 30d supply, fill #0
  Filled 2022-05-12: qty 35, 17d supply, fill #1
  Filled 2022-05-12: qty 25, 13d supply, fill #1

## 2022-04-02 NOTE — Telephone Encounter (Signed)
Requested medication (s) are due for refill today: yes  Requested medication (s) are on the active medication list: yes    Last refill: 03/16/22  #30  0 refills  Future visit scheduled yes 07/16/22  Notes to clinic:Not delegated, please review. Thank you.  Requested Prescriptions  Pending Prescriptions Disp Refills   meclizine (ANTIVERT) 12.5 MG tablet 30 tablet 0    Sig: Take 1 tablet (12.5 mg total) by mouth 2 (two) times daily as needed for dizziness.     Not Delegated - Gastroenterology: Antiemetics Failed - 04/02/2022  1:32 PM      Failed - This refill cannot be delegated      Passed - Valid encounter within last 6 months    Recent Outpatient Visits           2 weeks ago Benign paroxysmal positional vertigo, unspecified laterality   [place], Deborah B, MD   4 months ago Type 2 diabetes mellitus with peripheral neuropathy [facility])   [place] Karle Plumber B, MD   8 months ago Type 2 diabetes mellitus with peripheral neuropathy [facility])   [place], [place]   11 months ago Pharyngitis, unspecified etiology   [place], [place]   1 year ago Type 2 diabetes mellitus with peripheral neuropathy [facility])   [place], [place]       Future Appointments             In 3 months Wynetta Emery Dalbert Batman, MD [place]

## 2022-04-08 ENCOUNTER — Other Ambulatory Visit: Payer: Self-pay

## 2022-04-10 ENCOUNTER — Other Ambulatory Visit: Payer: Self-pay

## 2022-04-13 ENCOUNTER — Other Ambulatory Visit: Payer: Self-pay

## 2022-04-13 ENCOUNTER — Other Ambulatory Visit: Payer: Self-pay | Admitting: Internal Medicine

## 2022-04-13 DIAGNOSIS — E1142 Type 2 diabetes mellitus with diabetic polyneuropathy: Secondary | ICD-10-CM

## 2022-04-13 MED ORDER — TECHLITE PEN NEEDLES 32G X 4 MM MISC
1.0000 | Freq: Two times a day (BID) | 1 refills | Status: DC
  Filled 2022-04-13: qty 100, 25d supply, fill #0
  Filled 2022-07-03: qty 100, 25d supply, fill #1

## 2022-04-14 ENCOUNTER — Other Ambulatory Visit: Payer: Self-pay

## 2022-04-17 ENCOUNTER — Encounter: Payer: Self-pay | Admitting: Internal Medicine

## 2022-05-12 ENCOUNTER — Other Ambulatory Visit: Payer: Self-pay | Admitting: Pharmacist

## 2022-05-12 ENCOUNTER — Other Ambulatory Visit: Payer: Self-pay

## 2022-05-12 ENCOUNTER — Other Ambulatory Visit: Payer: Self-pay | Admitting: Endocrinology

## 2022-05-12 ENCOUNTER — Other Ambulatory Visit (HOSPITAL_COMMUNITY): Payer: Self-pay

## 2022-05-12 MED ORDER — ACCU-CHEK SOFTCLIX LANCETS MISC
2 refills | Status: DC
  Filled 2022-05-12: qty 100, 25d supply, fill #0
  Filled 2022-06-10: qty 100, 33d supply, fill #1
  Filled 2022-09-07: qty 100, 33d supply, fill #2

## 2022-05-12 MED ORDER — ACCU-CHEK GUIDE W/DEVICE KIT
PACK | 0 refills | Status: AC
  Filled 2022-05-12: qty 1, 1d supply, fill #0

## 2022-05-12 MED ORDER — ACCU-CHEK GUIDE VI STRP
ORAL_STRIP | 2 refills | Status: DC
  Filled 2022-05-12: qty 100, 25d supply, fill #0
  Filled 2022-06-10: qty 100, 33d supply, fill #1
  Filled 2022-09-07: qty 100, 33d supply, fill #2

## 2022-05-13 ENCOUNTER — Other Ambulatory Visit: Payer: Self-pay

## 2022-05-15 ENCOUNTER — Other Ambulatory Visit: Payer: Self-pay

## 2022-05-25 ENCOUNTER — Other Ambulatory Visit: Payer: Self-pay

## 2022-05-25 DIAGNOSIS — E1142 Type 2 diabetes mellitus with diabetic polyneuropathy: Secondary | ICD-10-CM

## 2022-05-25 MED ORDER — HUMULIN N KWIKPEN 100 UNIT/ML ~~LOC~~ SUPN
100.0000 [IU] | PEN_INJECTOR | SUBCUTANEOUS | 1 refills | Status: DC
  Filled 2022-05-25: qty 30, 30d supply, fill #0

## 2022-05-26 ENCOUNTER — Other Ambulatory Visit: Payer: Self-pay

## 2022-05-27 ENCOUNTER — Other Ambulatory Visit: Payer: Self-pay

## 2022-06-08 ENCOUNTER — Other Ambulatory Visit (HOSPITAL_BASED_OUTPATIENT_CLINIC_OR_DEPARTMENT_OTHER): Payer: Self-pay

## 2022-06-10 ENCOUNTER — Other Ambulatory Visit: Payer: Self-pay

## 2022-06-10 ENCOUNTER — Encounter: Payer: Self-pay | Admitting: Internal Medicine

## 2022-06-10 ENCOUNTER — Other Ambulatory Visit (HOSPITAL_BASED_OUTPATIENT_CLINIC_OR_DEPARTMENT_OTHER): Payer: Self-pay

## 2022-06-10 ENCOUNTER — Ambulatory Visit (INDEPENDENT_AMBULATORY_CARE_PROVIDER_SITE_OTHER): Payer: Medicaid Other | Admitting: Internal Medicine

## 2022-06-10 VITALS — BP 118/62 | HR 77 | Ht 61.0 in | Wt 219.0 lb

## 2022-06-10 DIAGNOSIS — E1142 Type 2 diabetes mellitus with diabetic polyneuropathy: Secondary | ICD-10-CM

## 2022-06-10 DIAGNOSIS — E785 Hyperlipidemia, unspecified: Secondary | ICD-10-CM | POA: Diagnosis not present

## 2022-06-10 DIAGNOSIS — E1169 Type 2 diabetes mellitus with other specified complication: Secondary | ICD-10-CM

## 2022-06-10 LAB — POCT GLYCOSYLATED HEMOGLOBIN (HGB A1C): Hemoglobin A1C: 6.2 % — AB (ref 4.0–5.6)

## 2022-06-10 MED ORDER — OZEMPIC (1 MG/DOSE) 4 MG/3ML ~~LOC~~ SOPN
1.0000 mg | PEN_INJECTOR | SUBCUTANEOUS | 3 refills | Status: DC
  Filled 2022-06-10: qty 9, 84d supply, fill #0
  Filled 2022-07-03: qty 12, 112d supply, fill #0
  Filled 2022-07-06 – 2022-10-05 (×2): qty 3, 28d supply, fill #1
  Filled 2022-11-02: qty 3, 28d supply, fill #2

## 2022-06-10 MED ORDER — METFORMIN HCL ER 500 MG PO TB24: 2000.0000 mg | ORAL_TABLET | Freq: Every day | ORAL | 3 refills | Status: DC

## 2022-06-10 MED ORDER — HUMULIN N KWIKPEN 100 UNIT/ML ~~LOC~~ SUPN
110.0000 [IU] | PEN_INJECTOR | SUBCUTANEOUS | 1 refills | Status: DC
  Filled 2022-06-10 – 2022-07-03 (×2): qty 45, 37d supply, fill #0

## 2022-06-10 NOTE — Progress Notes (Signed)
Patient ID: [name], female   DOB: 1966/03/21, 57 y.o.   MRN: [id number]  HPI: Teresa Camacho is a 57 y.o.-year-old female, returning for follow-up for DM2, dx in 2021, insulin-dependent soon post dx., uncontrolled, with complications (PN). Pt. previously saw Dr. Loanne Drilling, last visit with me 4 months ago.  Interim history: No increased urination, blurry vision, nausea, chest pain. She was able to start the higher dose of Ozempic, 1 mg weekly, which she is tolerating reasonably well.  She inadvertently took a higher dose recently but she had GI side effects-see below.  Reviewed HbA1c: Lab Results  Component Value Date   HGBA1C 6.6 (A) 02/06/2022   HGBA1C 7.3 (A) 10/06/2021   HGBA1C 7.3 (A) 07/30/2021   HGBA1C 8.9 (A) 05/28/2021   HGBA1C 8.0 (A) 03/26/2021   HGBA1C 8.2 (A) 01/24/2021   HGBA1C 8.8 (A) 11/05/2020   HGBA1C 8.4 (A) 08/19/2020   HGBA1C 9.5 (A) 03/14/2020   HGBA1C 11.5 (H) 12/01/2019   Pt is on a regimen of: - Metformin ER 1000 mg 2x a day, with meals - Humulin NPH 150 >> 130 >> 100 >> 120 >> 100 units in am - Ozempic 0.25 >> 0.5 >> 1 mg weekly - tried a higher dose - diarrhea, belching (Mylanta did not help) She was on Lantus before >> sugars high later in the day.  Pt checks her sugars 2x a day and they are: - am: 106, 127-174, 183, 208 >> 85, 112-156, 159, 177 >> 83-158 - 2h after b'fast: n/c - before lunch: 55 (delayed lunch) >> n/c - 2h after lunch: n/c - before dinner: n/c >> 70-105 - 2h after dinner: 95, 100-149, 202 >> 56, 79-155, 209 >> 118-245 - bedtime: n/c - nighttime: n/c Lowest sugar was 55 - midday >> 56 >> 82; she has hypoglycemia awareness at 70.  Highest sugar was 266>> 245.  Glucometer: True Metrix  - no CKD, last BUN/creatinine:  Lab Results  Component Value Date   BUN 9 03/16/2022   BUN 13 03/13/2021   CREATININE 0.72 03/16/2022   CREATININE 0.73 03/13/2021  She is on lisinopril 10 mg daily.  - +HL; last set of lipids: Lab Results   Component Value Date   CHOL 73 (L) 03/16/2022   HDL 40 03/16/2022   LDLCALC 15 03/16/2022   LDLDIRECT 95.5 12/02/2019   TRIG 87 03/16/2022   CHOLHDL 1.8 03/16/2022  She is on Lipitor 80 mg daily and Tricor 48 mg daily.  - last eye exam was "long time ago". No DR.   - + numbness and tingling in her feet.  She is on Neurontin 300 mg x 2 at bedtime last foot exam 02/06/2022.  She has a history of PCOS, HTN, OSA, GERD, fibroids, s/p TAH.  ROS: + see HPI  Past Medical History:  Diagnosis Date   Anemia    Blood transfusion without reported diagnosis    Fibroids, submucosal 05/03/2013   GERD (gastroesophageal reflux disease)    Hyperlipidemia    Hypertension    PCOS (polycystic ovarian syndrome)    PONV (postoperative nausea and vomiting)    " super tight airway" as per pt   Sleep apnea    Type II diabetes mellitus ([place])    Past Surgical History:  Procedure Laterality Date   ABDOMINAL HYSTERECTOMY N/A 06/13/2013   Procedure: HYSTERECTOMY ABDOMINAL and removal of vaginal skin tag;  Surgeon: Luz Lex, MD;  Location: [facility];  Service: Gynecology;  Laterality: N/A;  APPENDECTOMY     BALLOON DILATION N/A 08/27/2020   Procedure: BALLOON DILATION;  Surgeon: Irene Shipper, MD;  Location: [facility];  Service: Endoscopy;  Laterality: N/A;   CESAREAN SECTION     3x   CHOLECYSTECTOMY     CHOLECYSTECTOMY  1991   COLONOSCOPY WITH PROPOFOL N/A 08/27/2020   Procedure: COLONOSCOPY WITH PROPOFOL;  Surgeon: Irene Shipper, MD;  Location: [facility];  Service: Endoscopy;  Laterality: N/A;   ESOPHAGOGASTRODUODENOSCOPY (EGD) WITH PROPOFOL N/A 08/27/2020   Procedure: ESOPHAGOGASTRODUODENOSCOPY (EGD) WITH PROPOFOL;  Surgeon: Irene Shipper, MD;  Location: [facility];  Service: Endoscopy;  Laterality: N/A;   POLYPECTOMY  08/27/2020   Procedure: POLYPECTOMY;  Surgeon: Irene Shipper, MD;  Location: [facility];  Service: Endoscopy;;   Social History   Socioeconomic History   Marital  status: Divorced    Spouse name: Not on file   Number of children: 5   Years of education: Not on file   Highest education level: Not on file  Occupational History   Not on file  Tobacco Use   Smoking status: Never   Smokeless tobacco: Never  Vaping Use   Vaping Use: Never used  Substance and Sexual Activity   Alcohol use: No   Drug use: No   Sexual activity: Not Currently  Other Topics Concern   Not on file  Social History Narrative   Not on file   Social Determinants of Health   Financial Resource Strain: Not on file  Food Insecurity: Not on file  Transportation Needs: Not on file  Physical Activity: Not on file  Stress: Not on file  Social Connections: Not on file  Intimate Partner Violence: Not on file   Current Outpatient Medications on File Prior to Visit  Medication Sig Dispense Refill   Accu-Chek Softclix Lancets lancets use one lancet to check blood glucose three times a day 100 each 2   albuterol (VENTOLIN HFA) 108 (90 Base) MCG/ACT inhaler Inhale 2 puffs into the lungs every 6 (six) hours as needed for wheezing or shortness of breath. 8 g 0   aspirin EC 81 MG tablet Take 1 tablet (81 mg total) by mouth daily. Swallow whole. (Patient taking differently: Take 81 mg by mouth in the morning. Swallow whole.) 100 tablet 2   atorvastatin (LIPITOR) 80 MG tablet Take 1 tablet (80 mg total) by mouth daily. 90 tablet 2   blood glucose meter kit and supplies KIT Dispense based on patient and insurance preference. Use up to four times daily as directed. (FOR ICD-9 250.00, 250.01). 1 each 0   Blood Glucose Monitoring Suppl (ACCU-CHEK GUIDE) w/Device KIT use to check blood glucose three times a day 1 kit 0   Cholecalciferol (VITAMIN D-3) 125 MCG (5000 UT) TABS Take 5,000 Units by mouth in the morning.     dapagliflozin propanediol (FARXIGA) 5 MG TABS tablet Take 1 tablet (5 mg total) by mouth daily. 90 tablet 3   desloratadine (CLARINEX) 5 MG tablet Take 5 mg by mouth at bedtime.      diphenhydrAMINE (BENADRYL) 25 mg capsule Take 50 mg by mouth at bedtime.     fenofibrate (TRICOR) 48 MG tablet Take 1 tablet (48 mg total) by mouth daily. 90 tablet 2   gabapentin (NEURONTIN) 300 MG capsule Take 2 capsules (600 mg total) by mouth at bedtime. 60 capsule 5   glucose blood (ACCU-CHEK GUIDE) test strip use one test strip to check blood glucose three times a day 100  each 2   Insulin NPH, Human,, Isophane, (HUMULIN N KWIKPEN) 100 UNIT/ML Kiwkpen Inject 100 Units into the skin every morning. 90 mL 1   Insulin Pen Needle (TECHLITE PEN NEEDLES) 32G X 4 MM MISC Use in the morning and at bedtime 100 each 1   lisinopril (ZESTRIL) 10 MG tablet Take 1 tablet (10 mg total) by mouth daily. 90 tablet 2   loratadine (CLARITIN) 10 MG tablet Take 1 tablet (10 mg total) by mouth daily. 30 tablet 6   meclizine (ANTIVERT) 12.5 MG tablet Take 1 tablet (12.5 mg total) by mouth 2 (two) times daily as needed for dizziness. 60 tablet 3   Melatonin 10 MG SUBL Place 20 mg under the tongue at bedtime.     metFORMIN (GLUCOPHAGE-XR) 500 MG 24 hr tablet TAKE FOUR TABLETS BY MOUTH DAILY 360 tablet 3   metoprolol tartrate (LOPRESSOR) 25 MG tablet Take 1 tablet (25 mg total) by mouth 2 (two) times daily. 360 tablet 1   Multiple Vitamins-Minerals (MULTIVITAMIN WITH MINERALS) tablet Take 1 tablet by mouth daily.     pantoprazole (PROTONIX) 40 MG tablet TAKE ONE TABLET BY MOUTH TWICE A DAY BEFORE A MEAL 180 tablet 1   Semaglutide, 1 MG/DOSE, (OZEMPIC, 1 MG/DOSE,) 4 MG/3ML SOPN Inject 1 mg into the skin once a week. 9 mL 3   traZODone (DESYREL) 100 MG tablet Take 100 mg by mouth at bedtime.     No current facility-administered medications on file prior to visit.   Allergies  Allergen Reactions   Wound Dressing Adhesive Other (See Comments)    Redness/irritation   Codeine Nausea And Vomiting    Pt is able to take percocet & vicodin   Latex Rash    irritation   Family History  Problem Relation Age of Onset    Depression Mother    Diabetes Mother    Arthritis Mother    Heart disease Mother    Learning disabilities Mother    Mental illness Mother    Liver cancer Mother        metastatic from colon??   Arthritis Father    Prostate cancer Father        met to esophagus   Colon polyps Sister    Cancer Maternal Aunt        x 2 Aunts   Breast cancer Maternal Grandmother    PE: BP 118/62 (BP Location: Right Arm, Patient Position: Sitting, Cuff Size: Normal)   Pulse 77   Ht '5\' 1"'$  (1.549 m)   Wt 219 lb (99.3 kg)   LMP 03/13/2013   SpO2 98%   BMI 41.38 kg/m  Wt Readings from Last 3 Encounters:  06/10/22 219 lb (99.3 kg)  03/16/22 222 lb 9.6 oz (101 kg)  02/06/22 224 lb (101.6 kg)   Constitutional: overweight, in NAD Eyes:  EOMI, no exophthalmos ENT: no neck masses, no cervical lymphadenopathy Cardiovascular: RRR, No MRG Respiratory: CTA B Musculoskeletal: no deformities Skin:no rashes Neurological: no tremor with outstretched hands  ASSESSMENT: 1. DM2, insulin-dependent, uncontrolled, with complications - PN  2. HL  PLAN:  1. Patient with longstanding, uncontrolled, type 2 diabetes, on oral antidiabetic regimen, with metformin ER, and also weekly GLP-1 receptor agonist and daily intermediate acting insulin with improved control at last visit.  HbA1c at that time was 6.6%, decreased from 7.3%.  At that time, she was interested in increasing the Ozempic dose, as she was tolerating it well.  Reviewing the blood sugars at home, they were  at goal, with occasional lower values in the 50s.  Therefore, I advised her to decrease the NPH further at that time and increase Ozempic dose. -At today's visit, we reviewed her detailed sugar log.  Sugars appears to be at goal on 120 units of NPH in the morning, but they were higher on the 100 units daily.  She also tells me that she tried to split the dose as recommended at last visit, but this did not work well for her.  She would like to continue  at the higher dose, if possible.  Since, indeed, sugars are going to the 200s at night and they are in the 150s in the morning when taking the 100 unit dose of NPH, we discussed about possibly using a slightly higher dose and increasing blood sugars are at goal, but without lows.  We cannot increase the dose of Ozempic since she did have GI side effects when she inadvertently took more (finished 1 pen and started the other). - I suggested to:  Patient Instructions  Please continue: - Metformin ER 1000 mg 2x a day, with meals - Ozempic 1 mg weekly   You can try to increase: - NPH 110 units in am  Please return in 3-4 months.   - we checked her HbA1c: 6.2% (improved) - advised to check sugars at different times of the day - 1-2x a day, rotating check times - advised for yearly eye exams >> she is not UTD - return to clinic in 3-4 months  2. HL -Reviewed latest lipid panel from 02/2022: All lipid fractions at goal: Lab Results  Component Value Date   CHOL 73 (L) 03/16/2022   HDL 40 03/16/2022   LDLCALC 15 03/16/2022   LDLDIRECT 95.5 12/02/2019   TRIG 87 03/16/2022   CHOLHDL 1.8 03/16/2022  -She continues Lipitor 80 mg daily and Tricor 48 mg daily without side effects  Philemon Kingdom, MD PhD [facility] Endocrinology

## 2022-06-10 NOTE — Patient Instructions (Addendum)
Please continue: - Metformin ER 1000 mg 2x a day, with meals - Ozempic 1 mg weekly   You can try to increase: - NPH 110 units in am  Please return in 3-4 months.

## 2022-06-26 ENCOUNTER — Other Ambulatory Visit: Payer: Self-pay | Admitting: Internal Medicine

## 2022-06-26 DIAGNOSIS — K219 Gastro-esophageal reflux disease without esophagitis: Secondary | ICD-10-CM

## 2022-07-03 ENCOUNTER — Telehealth: Payer: Self-pay | Admitting: Internal Medicine

## 2022-07-03 ENCOUNTER — Other Ambulatory Visit: Payer: Self-pay | Admitting: Internal Medicine

## 2022-07-03 ENCOUNTER — Other Ambulatory Visit: Payer: Self-pay

## 2022-07-03 DIAGNOSIS — E1142 Type 2 diabetes mellitus with diabetic polyneuropathy: Secondary | ICD-10-CM

## 2022-07-03 MED ORDER — GABAPENTIN 300 MG PO CAPS
600.0000 mg | ORAL_CAPSULE | Freq: Every day | ORAL | 5 refills | Status: DC
  Filled 2022-07-03: qty 60, 30d supply, fill #0
  Filled ????-??-??: fill #0

## 2022-07-03 NOTE — Telephone Encounter (Signed)
Copied from [place] [id number]. Topic: General - Other >> Jul 03, 2022 11:54 AM Cyndi Bender wrote: Reason for CRM: Pt stated a prior authorization is needed for gabapentin (NEURONTIN) 300 MG capsule and she would like to request that the Rx be increased to 3 capsules instead of 2.

## 2022-07-05 MED ORDER — GABAPENTIN 300 MG PO CAPS
900.0000 mg | ORAL_CAPSULE | Freq: Every day | ORAL | 5 refills | Status: DC
  Filled 2022-07-05: qty 90, 30d supply, fill #0
  Filled 2022-09-07: qty 90, 30d supply, fill #1
  Filled 2022-10-05: qty 90, 30d supply, fill #2
  Filled 2022-11-02: qty 90, 30d supply, fill #3
  Filled 2022-12-14 (×2): qty 90, 30d supply, fill #4
  Filled 2023-01-13: qty 90, 30d supply, fill #5

## 2022-07-05 NOTE — Addendum Note (Signed)
Addended by: Karle Plumber B on: 07/05/2022 05:45 PM   Modules accepted: Orders

## 2022-07-06 ENCOUNTER — Other Ambulatory Visit: Payer: Self-pay

## 2022-07-06 ENCOUNTER — Encounter: Payer: Self-pay | Admitting: Internal Medicine

## 2022-07-06 ENCOUNTER — Telehealth: Payer: Self-pay

## 2022-07-06 DIAGNOSIS — E1142 Type 2 diabetes mellitus with diabetic polyneuropathy: Secondary | ICD-10-CM

## 2022-07-06 NOTE — Telephone Encounter (Signed)
Inbound call from pharmacy to advise pt is newly insured and her insulin preferred product is the Humulin N vial. Pt does not want to use vials and would prefer the kwikpen. PA can be submitted to insurance for pt to use Kwikpen.

## 2022-07-06 NOTE — Telephone Encounter (Signed)
Pt called to advise PA is needed for Ozempic and insulin. Pt prefers to use a pen and advised changing her insulin from Humulin may be better,

## 2022-07-07 ENCOUNTER — Other Ambulatory Visit: Payer: Self-pay

## 2022-07-07 NOTE — Telephone Encounter (Signed)
T, let's try to send Toujeo with 100 units daily and we will need to adjust it based on results.  She will also need pen needles 32-gauge by 4 mm.  If Toujeo is not covered, we could try [place] U200.  If this is not covered, the next option would be Lantus.  Please let me know.

## 2022-07-08 ENCOUNTER — Other Ambulatory Visit: Payer: Self-pay

## 2022-07-08 MED ORDER — INSULIN GLARGINE 100 UNITS/ML SOLOSTAR PEN
100.0000 [IU] | PEN_INJECTOR | Freq: Every day | SUBCUTANEOUS | 1 refills | Status: DC
  Filled 2022-07-08: qty 90, 90d supply, fill #0
  Filled 2022-10-05: qty 90, 90d supply, fill #1

## 2022-07-08 MED ORDER — TOUJEO MAX SOLOSTAR 300 UNIT/ML ~~LOC~~ SOPN
100.0000 [IU] | PEN_INJECTOR | Freq: Every day | SUBCUTANEOUS | 1 refills | Status: DC
  Filled 2022-07-08: qty 30, 90d supply, fill #0

## 2022-07-08 MED ORDER — TECHLITE PEN NEEDLES 32G X 4 MM MISC
1.0000 | Freq: Two times a day (BID) | 1 refills | Status: DC
  Filled 2022-07-08 – 2022-09-07 (×2): qty 200, 50d supply, fill #0
  Filled 2022-10-05 (×2): qty 200, 50d supply, fill #1
  Filled 2022-12-14: qty 100, 50d supply, fill #1
  Filled 2023-02-01: qty 100, 50d supply, fill #2

## 2022-07-08 NOTE — Telephone Encounter (Signed)
Rx for Toujeo w/ pen needles sent.

## 2022-07-08 NOTE — Addendum Note (Signed)
Addended by: Lauralyn Primes on: 07/08/2022 09:34 AM   Modules accepted: Orders

## 2022-07-08 NOTE — Telephone Encounter (Signed)
Inbound message from pharmacy advising: The Toujeo Max requires a prior authorization. I ran a test claim through for [place] and this also requires a prior authorization. Lantus Solostart did process successfully on patient's ins with a test claim.  Rx sent for Lantus.

## 2022-07-10 ENCOUNTER — Other Ambulatory Visit: Payer: Self-pay

## 2022-07-13 ENCOUNTER — Other Ambulatory Visit: Payer: Self-pay

## 2022-07-15 ENCOUNTER — Other Ambulatory Visit: Payer: Self-pay

## 2022-07-16 ENCOUNTER — Ambulatory Visit: Payer: Medicaid Other | Attending: Internal Medicine | Admitting: Internal Medicine

## 2022-07-16 ENCOUNTER — Other Ambulatory Visit: Payer: Self-pay

## 2022-07-22 ENCOUNTER — Other Ambulatory Visit: Payer: Self-pay

## 2022-08-04 ENCOUNTER — Other Ambulatory Visit: Payer: Self-pay

## 2022-08-10 ENCOUNTER — Other Ambulatory Visit: Payer: Self-pay | Admitting: Internal Medicine

## 2022-08-10 DIAGNOSIS — E1169 Type 2 diabetes mellitus with other specified complication: Secondary | ICD-10-CM

## 2022-08-14 ENCOUNTER — Other Ambulatory Visit: Payer: Self-pay

## 2022-08-14 ENCOUNTER — Encounter: Payer: Self-pay | Admitting: Internal Medicine

## 2022-08-14 ENCOUNTER — Ambulatory Visit: Payer: Medicaid Other | Attending: Internal Medicine | Admitting: Internal Medicine

## 2022-08-14 DIAGNOSIS — E1169 Type 2 diabetes mellitus with other specified complication: Secondary | ICD-10-CM | POA: Diagnosis not present

## 2022-08-14 DIAGNOSIS — G4733 Obstructive sleep apnea (adult) (pediatric): Secondary | ICD-10-CM

## 2022-08-14 DIAGNOSIS — I152 Hypertension secondary to endocrine disorders: Secondary | ICD-10-CM

## 2022-08-14 DIAGNOSIS — E1159 Type 2 diabetes mellitus with other circulatory complications: Secondary | ICD-10-CM | POA: Diagnosis not present

## 2022-08-14 DIAGNOSIS — E785 Hyperlipidemia, unspecified: Secondary | ICD-10-CM

## 2022-08-14 MED ORDER — FREESTYLE LIBRE 3 SENSOR MISC
6 refills | Status: DC
  Filled 2022-08-14 – 2023-01-13 (×2): qty 2, 28d supply, fill #0
  Filled 2023-03-15: qty 2, 28d supply, fill #1

## 2022-08-14 MED ORDER — FREESTYLE LIBRE 3 READER DEVI
1.0000 | Freq: Every day | 0 refills | Status: DC
  Filled 2022-08-14 – 2023-01-13 (×2): qty 1, 30d supply, fill #0

## 2022-08-14 NOTE — Progress Notes (Signed)
Patient ID: Teresa Camacho, female   DOB: 28-Jul-1965, 57 y.o.   MRN: [id number] Virtual Visit via Video Note  I connected with Teresa Camacho on 08/14/2022 at 10:47 AM by a video enabled telemedicine application and verified that I am speaking with the correct person using two identifiers.  Location: Patient: home Provider: Home Office   I discussed the limitations of evaluation and management by telemedicine and the availability of in person appointments. The patient expressed understanding and agreed to proceed.  History of Present Illness: Patient with history of DM type II with microalbumin, HTN, HL, OSA on CPAP, GERD,PCOS, pattern baldness, RLS (Gabapentin).   DM: Lab Results  Component Value Date   HGBA1C 6.2 (A) 06/10/2022   Last saw Dr. Cruzita Lederer 06/10/2022. Humulin and was changed to Lantus insulin.  She is currently taking Lantus insulin 120 units daily, Farxiga 5 mg daily, metformin ER 1 g 2 tablets daily and Ozempic 1 mg once a week.  She has not noticed a major decrease in appetite on Ozempic.  Doing okay with eating habits.  Sensitive to attempted increase in Ozempic which would cause diarrhea and bloating.  Tolerating current dose.  Weight has been slowly decreasing.  On last visit with me she was 222 pounds.  Today she is 215 pounds. -Checks blood sugars twice a day.  Fasting morning blood sugars range 100-1 130s.  Blood sugars after meals range 135-146; did get a high of 173. Would like to get continuous glucose monitor if offered by her insurance.  HTN: Reports compliance with taking lisinopril 10 mg daily and metoprolol 25 mg twice a day.  Stopped using her blood pressure device because she thinks the readings were not accurate. -Dizzy spells have resolved.  HL: Taking and tolerating atorvastatin 80 mg daily and Tricor 48 mg daily.  OSA: Using her CPAP consistently.   Outpatient Encounter Medications as of 08/14/2022  Medication Sig Note   Accu-Chek Softclix Lancets lancets  use one lancet to check blood glucose three times a day    albuterol (VENTOLIN HFA) 108 (90 Base) MCG/ACT inhaler Inhale 2 puffs into the lungs every 6 (six) hours as needed for wheezing or shortness of breath.    aspirin EC 81 MG tablet Take 1 tablet (81 mg total) by mouth daily. Swallow whole. (Patient taking differently: Take 81 mg by mouth in the morning. Swallow whole.)    atorvastatin (LIPITOR) 80 MG tablet TAKE 1 TABLET BY MOUTH DAILY    blood glucose meter kit and supplies KIT Dispense based on patient and insurance preference. Use up to four times daily as directed. (FOR ICD-9 250.00, 250.01).    Blood Glucose Monitoring Suppl (ACCU-CHEK GUIDE) w/Device KIT use to check blood glucose three times a day    Cholecalciferol (VITAMIN D-3) 125 MCG (5000 UT) TABS Take 5,000 Units by mouth in the morning.    dapagliflozin propanediol (FARXIGA) 5 MG TABS tablet Take 1 tablet (5 mg total) by mouth daily.    desloratadine (CLARINEX) 5 MG tablet Take 5 mg by mouth at bedtime.    diphenhydrAMINE (BENADRYL) 25 mg capsule Take 50 mg by mouth at bedtime.    fenofibrate (TRICOR) 48 MG tablet Take 1 tablet (48 mg total) by mouth daily.    gabapentin (NEURONTIN) 300 MG capsule Take 3 capsules (900 mg total) by mouth at bedtime.    glucose blood (ACCU-CHEK GUIDE) test strip use one test strip to check blood glucose three times a day  insulin glargine (LANTUS) 100 unit/mL SOPN Inject 100 Units into the skin daily.    Insulin NPH, Human,, Isophane, (HUMULIN N KWIKPEN) 100 UNIT/ML Kiwkpen Inject 110-120 Units into the skin every morning.    Insulin Pen Needle (TECHLITE PEN NEEDLES) 32G X 4 MM MISC Use in the morning and at bedtime    lisinopril (ZESTRIL) 10 MG tablet Take 1 tablet (10 mg total) by mouth daily.    loratadine (CLARITIN) 10 MG tablet Take 1 tablet (10 mg total) by mouth daily.    meclizine (ANTIVERT) 12.5 MG tablet Take 1 tablet (12.5 mg total) by mouth 2 (two) times daily as needed for  dizziness.    Melatonin 10 MG SUBL Place 20 mg under the tongue at bedtime.    metFORMIN (GLUCOPHAGE-XR) 500 MG 24 hr tablet Take 4 tablets (2,000 mg total) by mouth daily.    metoprolol tartrate (LOPRESSOR) 25 MG tablet Take 1 tablet (25 mg total) by mouth 2 (two) times daily.    Multiple Vitamins-Minerals (MULTIVITAMIN WITH MINERALS) tablet Take 1 tablet by mouth daily. 08/16/2020: On hold until patient purchases more   pantoprazole (PROTONIX) 40 MG tablet TAKE 1 TABLET BY MOUTH TWICE A DAY BEFORE A MEAL    Semaglutide, 1 MG/DOSE, (OZEMPIC, 1 MG/DOSE,) 4 MG/3ML SOPN Inject 1 mg into the skin once a week.    traZODone (DESYREL) 100 MG tablet Take 100 mg by mouth at bedtime.    No facility-administered encounter medications on file as of 08/14/2022.      Observations/Objective:   Middle-aged older Caucasian female in NAD  Assessment and Plan: 1. Type 2 diabetes mellitus with morbid obesity ([place]) Reported blood sugar readings are at goal. She will continue her current medications including Farxiga 5 mg daily, metformin ER 1 g 2 tablets daily, Ozempic 1 mg weekly and Lantus insulin 120 units daily. Prescription sent for continuous glucose monitor. Encouraged her to continue healthy eating habits. - Ambulatory referral to Ophthalmology - Continuous Blood Gluc Sensor (FREESTYLE LIBRE 3 SENSOR) MISC; Change Q 2 weeks E11.69, E66.01  Dispense: 2 each; Refill: 6 - Continuous Blood Gluc Receiver (FREESTYLE LIBRE 3 READER) DEVI; 1 Device by Does not apply route daily. UAD to check BS E11.69, E66.01  Dispense: 1 each; Refill: 0  2. Hypertension associated with diabetes ([place]) Continue lisinopril 10 mg daily and metoprolol 25 mg twice a day.  Blood pressure on visit with endocrinologist in January was 118/62 which is at goal.  3. Hyperlipidemia associated with type 2 diabetes mellitus ([place]) Patient to continue atorvastatin 80 mg daily and Tricor 48 mg daily.  Last LDL was at goal.  4. OSA on  CPAP Encouraged her to continue using her CPAP consistently every night.   Follow Up Instructions: 4 mths   I discussed the assessment and treatment plan with the patient. The patient was provided an opportunity to ask questions and all were answered. The patient agreed with the plan and demonstrated an understanding of the instructions.   The patient was advised to call back or seek an in-person evaluation if the symptoms worsen or if the condition fails to improve as anticipated.  I spent  19 minutes dedicated to the care of this patient on the date of this encounter to include previsit review of of chart including my last note and the note of the endocrinologist, face-to-face time with patient discussing diagnosis and management and postvisit entering of orders.  This note has been created with Museum/gallery curator and  smart Company secretary. Any transcriptional errors are unintentional.  Karle Plumber, MD

## 2022-08-17 ENCOUNTER — Other Ambulatory Visit: Payer: Self-pay

## 2022-08-19 ENCOUNTER — Telehealth: Payer: Self-pay | Admitting: Internal Medicine

## 2022-08-19 NOTE — Telephone Encounter (Signed)
Copied from [place] [id number]. Topic: Referral - Status >> Aug 19, 2022  1:19 PM Dominique A wrote: Reason for CRM: Pt states that the referral she received for her diabetic eye appt is booked out until October. Pt is wanting to see if she can be referred somewhere else. Please advise.

## 2022-08-20 ENCOUNTER — Encounter: Payer: Self-pay | Admitting: Pharmacist

## 2022-08-21 ENCOUNTER — Other Ambulatory Visit: Payer: Self-pay

## 2022-09-03 LAB — HM DIABETES EYE EXAM

## 2022-09-07 ENCOUNTER — Other Ambulatory Visit: Payer: Self-pay

## 2022-09-27 ENCOUNTER — Other Ambulatory Visit: Payer: Self-pay | Admitting: Internal Medicine

## 2022-09-27 DIAGNOSIS — E1169 Type 2 diabetes mellitus with other specified complication: Secondary | ICD-10-CM

## 2022-09-28 NOTE — Telephone Encounter (Signed)
Requested Prescriptions  Pending Prescriptions Disp Refills   fenofibrate (TRICOR) 48 MG tablet [Pharmacy Med Name: FENOFIBRATE 48 MG TABLET] 90 tablet 1    Sig: TAKE 1 TABLET BY MOUTH DAILY     Cardiovascular:  Antilipid - Fibric Acid Derivatives Failed - 09/27/2022  6:51 AM      Failed - Lipid Panel in normal range within the last 12 months    Cholesterol, Total  Date Value Ref Range Status  03/16/2022 73 (L) 100 - 199 mg/dL Final   LDL Chol Calc (NIH)  Date Value Ref Range Status  03/16/2022 15 0 - 99 mg/dL Final   Direct LDL  Date Value Ref Range Status  12/02/2019 95.5 0 - 99 mg/dL Final    Comment:    Performed at [facility], [address], [place], [place] [address]   HDL  Date Value Ref Range Status  03/16/2022 40 >39 mg/dL Final   Triglycerides  Date Value Ref Range Status  03/16/2022 87 0 - 149 mg/dL Final         Passed - ALT in normal range and within 360 days    ALT  Date Value Ref Range Status  03/16/2022 29 0 - 32 IU/L Final         Passed - AST in normal range and within 360 days    AST  Date Value Ref Range Status  03/16/2022 23 0 - 40 IU/L Final         Passed - Cr in normal range and within 360 days    Creatinine, Ser  Date Value Ref Range Status  03/16/2022 0.72 0.57 - 1.00 mg/dL Final         Passed - HGB in normal range and within 360 days    Hemoglobin  Date Value Ref Range Status  03/16/2022 14.3 11.1 - 15.9 g/dL Final         Passed - HCT in normal range and within 360 days    Hematocrit  Date Value Ref Range Status  03/16/2022 44.0 34.0 - 46.6 % Final         Passed - PLT in normal range and within 360 days    Platelets  Date Value Ref Range Status  03/16/2022 334 150 - 450 x10E3/uL Final         Passed - WBC in normal range and within 360 days    WBC  Date Value Ref Range Status  03/16/2022 10.7 3.4 - 10.8 x10E3/uL Final  12/01/2019 7.9 4.0 - 10.5 K/uL Final         Passed - eGFR is 30 or above and within  360 days    GFR calc Af Amer  Date Value Ref Range Status  12/02/2019 >60 >60 mL/min Final   GFR calc non Af Amer  Date Value Ref Range Status  12/02/2019 >60 >60 mL/min Final   eGFR  Date Value Ref Range Status  03/16/2022 99 >59 mL/min/1.73 Final         Passed - Valid encounter within last 12 months    Recent Outpatient Visits           1 month ago Type 2 diabetes mellitus with morbid obesity (HCC)   [facility] Jonah Blue B, MD   6 months ago Benign paroxysmal positional vertigo, unspecified laterality   [facility] Jonah Blue B, MD   10 months ago Type 2 diabetes  mellitus with peripheral neuropathy [facility])   [facility] Jonah Blue B, MD   1 year ago Type 2 diabetes mellitus with peripheral neuropathy [facility])   [place] [facility] Marcine Matar, MD   1 year ago Pharyngitis, unspecified etiology    [facility] [place], [place], [place]

## 2022-10-05 ENCOUNTER — Other Ambulatory Visit: Payer: Self-pay

## 2022-10-09 ENCOUNTER — Encounter: Payer: Self-pay | Admitting: Internal Medicine

## 2022-10-09 ENCOUNTER — Ambulatory Visit (INDEPENDENT_AMBULATORY_CARE_PROVIDER_SITE_OTHER): Payer: Medicaid Other | Admitting: Internal Medicine

## 2022-10-09 VITALS — BP 110/62 | HR 76 | Ht 61.0 in | Wt 216.6 lb

## 2022-10-09 DIAGNOSIS — E119 Type 2 diabetes mellitus without complications: Secondary | ICD-10-CM

## 2022-10-09 DIAGNOSIS — E785 Hyperlipidemia, unspecified: Secondary | ICD-10-CM

## 2022-10-09 DIAGNOSIS — E1142 Type 2 diabetes mellitus with diabetic polyneuropathy: Secondary | ICD-10-CM | POA: Diagnosis not present

## 2022-10-09 DIAGNOSIS — E1169 Type 2 diabetes mellitus with other specified complication: Secondary | ICD-10-CM

## 2022-10-09 DIAGNOSIS — Z7984 Long term (current) use of oral hypoglycemic drugs: Secondary | ICD-10-CM | POA: Diagnosis not present

## 2022-10-09 DIAGNOSIS — Z7985 Long-term (current) use of injectable non-insulin antidiabetic drugs: Secondary | ICD-10-CM | POA: Diagnosis not present

## 2022-10-09 DIAGNOSIS — Z794 Long term (current) use of insulin: Secondary | ICD-10-CM

## 2022-10-09 LAB — POCT GLYCOSYLATED HEMOGLOBIN (HGB A1C): Hemoglobin A1C: 6.4 % — AB (ref 4.0–5.6)

## 2022-10-09 NOTE — Progress Notes (Signed)
Patient ID: Teresa Camacho, female   DOB: 20-Jan-1966, 57 y.o.   MRN: [id number]  HPI: Teresa Camacho is a 57 y.o.-year-old female, returning for follow-up for DM2, dx in 2021, insulin-dependent soon post dx., uncontrolled, with complications (PN). Pt. previously saw Dr. Everardo All, last visit with me 4 months ago.  She is here with her daughter, who is also my patient.  Interim history: No increased urination, blurry vision, nausea, chest pain. She is stressed - sister sick.  Reviewed HbA1c: Lab Results  Component Value Date   HGBA1C 6.2 (A) 06/10/2022   HGBA1C 6.6 (A) 02/06/2022   HGBA1C 7.3 (A) 10/06/2021   HGBA1C 7.3 (A) 07/30/2021   HGBA1C 8.9 (A) 05/28/2021   HGBA1C 8.0 (A) 03/26/2021   HGBA1C 8.2 (A) 01/24/2021   HGBA1C 8.8 (A) 11/05/2020   HGBA1C 8.4 (A) 08/19/2020   HGBA1C 9.5 (A) 03/14/2020   Pt is on a regimen of: - Metformin ER 1000 mg 2x a day, with meals - Farxiga 5 mg daily in am - Humulin NPH 150 >> 130 >> 100 >> 120 >> 100 >> 110 units in am >> Lantus 120 units daily - Ozempic 0.25 >> 0.5 >> 1 mg weekly - tried a higher dose - diarrhea, belching (Mylanta did not help) She was on Lantus before >> sugars high later in the day.  Pt checks her sugars 2x a day and they are: - am: 106, 127-174, 183, 208 >> 85, 112-156, 159, 177 >> 83-158 >> 85-139 - 2h after b'fast: n/c - before lunch: 55 (delayed lunch) >> n/c >> 88, 99 - 2h after lunch: n/c >> 153 - before dinner: n/c >> 70-105 >> 111-139 - 2h after dinner: 95, 100-149, 202 >> 56, 79-155, 209 >> 118-245 >> 144-202 - bedtime: n/c - nighttime: n/c Lowest sugar was 55 - midday >> 56 >> 82 >> 85; she has hypoglycemia awareness at 70.  Highest sugar was 266>> 245 >> 202  Glucometer: True Metrix  - no CKD, last BUN/creatinine:  Lab Results  Component Value Date   BUN 9 03/16/2022   BUN 13 03/13/2021   CREATININE 0.72 03/16/2022   CREATININE 0.73 03/13/2021  She is on lisinopril 10 mg daily.  - +HL; last set of  lipids: Lab Results  Component Value Date   CHOL 73 (L) 03/16/2022   HDL 40 03/16/2022   LDLCALC 15 03/16/2022   LDLDIRECT 95.5 12/02/2019   TRIG 87 03/16/2022   CHOLHDL 1.8 03/16/2022  She is on Lipitor 80 mg daily and Tricor 48 mg daily.  - last eye exam was 09/03/2022. No DR.   - + numbness and tingling in her feet.  She is on Neurontin 300 mg x 2 at bedtime.  Last foot exam 02/06/2022.  She has a history of PCOS, HTN, OSA, GERD, fibroids, s/p TAH.  ROS: + see HPI  Past Medical History:  Diagnosis Date   Anemia    Blood transfusion without reported diagnosis    Fibroids, submucosal 05/03/2013   GERD (gastroesophageal reflux disease)    Hyperlipidemia    Hypertension    PCOS (polycystic ovarian syndrome)    PONV (postoperative nausea and vomiting)    " super tight airway" as per pt   Sleep apnea    Type II diabetes mellitus (HCC)    Past Surgical History:  Procedure Laterality Date   ABDOMINAL HYSTERECTOMY N/A 06/13/2013   Procedure: HYSTERECTOMY ABDOMINAL and removal of vaginal skin tag;  Surgeon: Turner Daniels, MD;  Location: [facility];  Service: Gynecology;  Laterality: N/A;   APPENDECTOMY     BALLOON DILATION N/A 08/27/2020   Procedure: BALLOON DILATION;  Surgeon: Hilarie Fredrickson, MD;  Location: [facility];  Service: Endoscopy;  Laterality: N/A;   CESAREAN SECTION     3x   CHOLECYSTECTOMY     CHOLECYSTECTOMY  1991   COLONOSCOPY WITH PROPOFOL N/A 08/27/2020   Procedure: COLONOSCOPY WITH PROPOFOL;  Surgeon: Hilarie Fredrickson, MD;  Location: [facility];  Service: Endoscopy;  Laterality: N/A;   ESOPHAGOGASTRODUODENOSCOPY (EGD) WITH PROPOFOL N/A 08/27/2020   Procedure: ESOPHAGOGASTRODUODENOSCOPY (EGD) WITH PROPOFOL;  Surgeon: Hilarie Fredrickson, MD;  Location: [facility];  Service: Endoscopy;  Laterality: N/A;   POLYPECTOMY  08/27/2020   Procedure: POLYPECTOMY;  Surgeon: Hilarie Fredrickson, MD;  Location: [facility];  Service: Endoscopy;;   Social History   Socioeconomic History    Marital status: Divorced    Spouse name: Not on file   Number of children: 5   Years of education: Not on file   Highest education level: Not on file  Occupational History   Not on file  Tobacco Use   Smoking status: Never   Smokeless tobacco: Never  Vaping Use   Vaping Use: Never used  Substance and Sexual Activity   Alcohol use: No   Drug use: No   Sexual activity: Not Currently  Other Topics Concern   Not on file  Social History Narrative   Not on file   Social Determinants of Health   Financial Resource Strain: Not on file  Food Insecurity: Not on file  Transportation Needs: Not on file  Physical Activity: Not on file  Stress: Not on file  Social Connections: Not on file  Intimate Partner Violence: Not on file   Current Outpatient Medications on File Prior to Visit  Medication Sig Dispense Refill   Accu-Chek Softclix Lancets lancets use one lancet to check blood glucose three times a day 100 each 2   albuterol (VENTOLIN HFA) 108 (90 Base) MCG/ACT inhaler Inhale 2 puffs into the lungs every 6 (six) hours as needed for wheezing or shortness of breath. 8 g 0   aspirin EC 81 MG tablet Take 1 tablet (81 mg total) by mouth daily. Swallow whole. (Patient taking differently: Take 81 mg by mouth in the morning. Swallow whole.) 100 tablet 2   atorvastatin (LIPITOR) 80 MG tablet TAKE 1 TABLET BY MOUTH DAILY 90 tablet 0   blood glucose meter kit and supplies KIT Dispense based on patient and insurance preference. Use up to four times daily as directed. (FOR ICD-9 250.00, 250.01). 1 each 0   Blood Glucose Monitoring Suppl (ACCU-CHEK GUIDE) w/Device KIT use to check blood glucose three times a day 1 kit 0   Cholecalciferol (VITAMIN D-3) 125 MCG (5000 UT) TABS Take 5,000 Units by mouth in the morning.     Continuous Blood Gluc Receiver (FREESTYLE LIBRE 3 READER) DEVI Use as directed to check blood glucose. 1 each 0   Continuous Blood Gluc Sensor (FREESTYLE LIBRE 3 SENSOR) MISC Change  every 2 weeks 2 each 6   dapagliflozin propanediol (FARXIGA) 5 MG TABS tablet Take 1 tablet (5 mg total) by mouth daily. 90 tablet 3   desloratadine (CLARINEX) 5 MG tablet Take 5 mg by mouth at bedtime.     diphenhydrAMINE (BENADRYL) 25 mg capsule Take 50 mg by mouth at bedtime.     fenofibrate (TRICOR) 48 MG tablet TAKE 1 TABLET BY MOUTH DAILY  90 tablet 1   gabapentin (NEURONTIN) 300 MG capsule Take 3 capsules (900 mg total) by mouth at bedtime. 90 capsule 5   glucose blood (ACCU-CHEK GUIDE) test strip use one test strip to check blood glucose three times a day 100 each 2   insulin glargine (LANTUS) 100 unit/mL SOPN Inject 100 Units into the skin daily. 90 mL 1   Insulin Pen Needle (TECHLITE PEN NEEDLES) 32G X 4 MM MISC Use in the morning and at bedtime 200 each 1   lisinopril (ZESTRIL) 10 MG tablet Take 1 tablet (10 mg total) by mouth daily. 90 tablet 2   loratadine (CLARITIN) 10 MG tablet Take 1 tablet (10 mg total) by mouth daily. 30 tablet 6   meclizine (ANTIVERT) 12.5 MG tablet Take 1 tablet (12.5 mg total) by mouth 2 (two) times daily as needed for dizziness. 60 tablet 3   Melatonin 10 MG SUBL Place 20 mg under the tongue at bedtime.     metFORMIN (GLUCOPHAGE-XR) 500 MG 24 hr tablet Take 4 tablets (2,000 mg total) by mouth daily. 360 tablet 3   metoprolol tartrate (LOPRESSOR) 25 MG tablet Take 1 tablet (25 mg total) by mouth 2 (two) times daily. 360 tablet 1   Multiple Vitamins-Minerals (MULTIVITAMIN WITH MINERALS) tablet Take 1 tablet by mouth daily.     pantoprazole (PROTONIX) 40 MG tablet TAKE 1 TABLET BY MOUTH TWICE A DAY BEFORE A MEAL 180 tablet 1   Semaglutide, 1 MG/DOSE, (OZEMPIC, 1 MG/DOSE,) 4 MG/3ML SOPN Inject 1 mg into the skin once a week. 9 mL 3   traZODone (DESYREL) 100 MG tablet Take 100 mg by mouth at bedtime.     No current facility-administered medications on file prior to visit.   Allergies  Allergen Reactions   Wound Dressing Adhesive Other (See Comments)     Redness/irritation   Codeine Nausea And Vomiting    Pt is able to take percocet & vicodin   Latex Rash    irritation   Family History  Problem Relation Age of Onset   Depression Mother    Diabetes Mother    Arthritis Mother    Heart disease Mother    Learning disabilities Mother    Mental illness Mother    Liver cancer Mother        metastatic from colon??   Arthritis Father    Prostate cancer Father        met to esophagus   Colon polyps Sister    Cancer Maternal Aunt        x 2 Aunts   Breast cancer Maternal Grandmother    PE: BP 110/62 (BP Location: Left Arm, Patient Position: Sitting, Cuff Size: Normal)   Pulse 76   Ht 5\' 1"  (1.549 m)   Wt 216 lb 9.6 oz (98.2 kg)   LMP 03/13/2013   SpO2 96%   BMI 40.93 kg/m  Wt Readings from Last 3 Encounters:  10/09/22 216 lb 9.6 oz (98.2 kg)  08/14/22 215 lb (97.5 kg)  06/10/22 219 lb (99.3 kg)   Constitutional: overweight, in NAD Eyes:  EOMI, no exophthalmos ENT: no neck masses, no cervical lymphadenopathy Cardiovascular: RRR, No MRG Respiratory: CTA B Musculoskeletal: no deformities Skin:no rashes Neurological: no tremor with outstretched hands  ASSESSMENT: 1. DM2, insulin-dependent, uncontrolled, with complications - PN  2. HL  PLAN:  1. Patient with longstanding, uncontrolled, type 2 diabetes, on oral antidiabetic regimen with metformin, SGLT2 inhibitor, also weekly GLP-1 receptor agonist and daily insulin, with  improving control.  At last visit, HbA1c was lower, at 6.2%.  At that time, sugars appear to be at goal on 120 units of NPH in the morning but they were higher on the 100 units daily.  She tries to split the dose as recommended at the previous visit but this did not work well for her.  At that time, we discussed about increasing the dose of Ozempic but we could not do so since she had GI side effects when she inadvertently took more. We discussed about possibly switching to an analog insulin formulation. -At  today's visit, she tells me that she was able to start Lantus but unfortunately was not able to decrease the dose.  She is interested in increasing Ozempic, willing to try it again, in an effort to try to decrease the insulin dose. -Sugars at home are mostly at goal, with only one higher blood sugar in the low 200s, but the rest of the sugars at goal - I suggested to:  Patient Instructions  Please continue: - Metformin ER 1000 mg 2x a day, with meals - Farxiga 5 mg before b'fast - Lantus 120 units daily in am (if you are able to increase Ozempic, start tapering the dose down)  You can try to increase: - Ozempic 2 mg weekly  Please return in 3-4 months.   - we checked her HbA1c: 6.4% (only slightly higher) - advised to check sugars at different times of the day - 1-2x a day, rotating check times - advised for yearly eye exams >> she is UTD - return to clinic in 3-4 months  2. HL -Reviewed latest lipid panel from 02/2022: Fractions at goal: Lab Results  Component Value Date   CHOL 73 (L) 03/16/2022   HDL 40 03/16/2022   LDLCALC 15 03/16/2022   LDLDIRECT 95.5 12/02/2019   TRIG 87 03/16/2022   CHOLHDL 1.8 03/16/2022  -She continues Lipitor 80 mg daily and Tricor 48 mg daily without side effects  Carlus Pavlov, MD PhD [facility]

## 2022-10-09 NOTE — Patient Instructions (Signed)
Please continue: - Metformin ER 1000 mg 2x a day, with meals - Farxiga 5 mg before b'fast - Lantus 120 units daily in am (if you are able to increase Ozempic, start tapering the dose down)  You can try to increase: - Ozempic 2 mg weekly  Please return in 3-4 months.

## 2022-11-02 ENCOUNTER — Other Ambulatory Visit: Payer: Self-pay

## 2022-11-11 ENCOUNTER — Other Ambulatory Visit: Payer: Self-pay | Admitting: Internal Medicine

## 2022-11-11 DIAGNOSIS — E1169 Type 2 diabetes mellitus with other specified complication: Secondary | ICD-10-CM

## 2022-11-16 ENCOUNTER — Encounter: Payer: Self-pay | Admitting: Internal Medicine

## 2022-11-16 ENCOUNTER — Other Ambulatory Visit: Payer: Self-pay | Admitting: Internal Medicine

## 2022-11-16 ENCOUNTER — Other Ambulatory Visit: Payer: Self-pay

## 2022-11-16 ENCOUNTER — Telehealth: Payer: Self-pay

## 2022-11-16 ENCOUNTER — Other Ambulatory Visit (HOSPITAL_COMMUNITY): Payer: Self-pay

## 2022-11-16 DIAGNOSIS — E1142 Type 2 diabetes mellitus with diabetic polyneuropathy: Secondary | ICD-10-CM

## 2022-11-16 MED ORDER — OZEMPIC (1 MG/DOSE) 4 MG/3ML ~~LOC~~ SOPN
2.0000 mg | PEN_INJECTOR | SUBCUTANEOUS | 3 refills | Status: DC
Start: 2022-11-16 — End: 2022-11-17

## 2022-11-16 NOTE — Telephone Encounter (Signed)
Jacobe Study Ann Kareemah Grounds, CMA  ?

## 2022-11-17 ENCOUNTER — Other Ambulatory Visit: Payer: Self-pay

## 2022-11-17 MED ORDER — OZEMPIC (1 MG/DOSE) 4 MG/3ML ~~LOC~~ SOPN
2.0000 mg | PEN_INJECTOR | SUBCUTANEOUS | 3 refills | Status: AC
Start: 2022-11-17 — End: ?

## 2022-12-14 ENCOUNTER — Other Ambulatory Visit: Payer: Self-pay | Admitting: Internal Medicine

## 2022-12-14 ENCOUNTER — Other Ambulatory Visit: Payer: Self-pay

## 2022-12-14 DIAGNOSIS — E1142 Type 2 diabetes mellitus with diabetic polyneuropathy: Secondary | ICD-10-CM

## 2022-12-14 MED ORDER — INSULIN GLARGINE 100 UNITS/ML SOLOSTAR PEN
100.0000 [IU] | PEN_INJECTOR | Freq: Every day | SUBCUTANEOUS | 1 refills | Status: DC
Start: 2022-12-14 — End: 2023-02-18
  Filled 2022-12-14 – 2022-12-15 (×2): qty 90, 90d supply, fill #0
  Filled 2023-02-18: qty 90, 90d supply, fill #1

## 2022-12-14 MED ORDER — OZEMPIC (1 MG/DOSE) 4 MG/3ML ~~LOC~~ SOPN
1.0000 mg | PEN_INJECTOR | SUBCUTANEOUS | 3 refills | Status: DC
  Filled 2022-12-14: qty 3, 28d supply, fill #0
  Filled 2023-01-04: qty 3, 28d supply, fill #1
  Filled 2023-02-01: qty 3, 28d supply, fill #2

## 2022-12-15 ENCOUNTER — Other Ambulatory Visit: Payer: Self-pay

## 2022-12-15 MED ORDER — ACCU-CHEK GUIDE VI STRP
ORAL_STRIP | 2 refills | Status: DC
  Filled 2022-12-15 (×2): qty 100, 25d supply, fill #0
  Filled 2023-01-13: qty 100, 25d supply, fill #1
  Filled 2023-02-01: qty 100, 25d supply, fill #2

## 2022-12-15 MED ORDER — ACCU-CHEK SOFTCLIX LANCETS MISC
2 refills | Status: DC
  Filled 2022-12-15: qty 100, 25d supply, fill #0
  Filled 2023-01-13: qty 100, 25d supply, fill #1
  Filled 2023-02-01: qty 100, 25d supply, fill #2

## 2022-12-15 NOTE — Telephone Encounter (Signed)
Requested Prescriptions  Pending Prescriptions Disp Refills   glucose blood (ACCU-CHEK GUIDE) test strip 100 each 2    Sig: use one test strip to check blood glucose three times a day     Endocrinology: Diabetes - Testing Supplies Passed - 12/14/2022  1:56 PM      Passed - Valid encounter within last 12 months    Recent Outpatient Visits           4 months ago Type 2 diabetes mellitus with morbid obesity (HCC)   [place] [facility] Jonah Blue B, MD   9 months ago Benign paroxysmal positional vertigo, unspecified laterality   [place] [facility] Jonah Blue B, MD   1 year ago Type 2 diabetes mellitus with peripheral neuropathy [facility])   [place] [facility] Jonah Blue B, MD   1 year ago Type 2 diabetes mellitus with peripheral neuropathy [facility])   [place] [facility] Marcine Matar, MD   1 year ago Pharyngitis, unspecified etiology   [place] [facility] [place], [place], [place]               Accu-Chek Softclix Lancets lancets 100 each 2    Sig: use one lancet to check blood glucose three times a day     Endocrinology: Diabetes - Testing Supplies Passed - 12/14/2022  1:56 PM      Passed - Valid encounter within last 12 months    Recent Outpatient Visits           4 months ago Type 2 diabetes mellitus with morbid obesity [facility])   [place] [facility] Jonah Blue B, MD   9 months ago Benign paroxysmal positional vertigo, unspecified laterality   [facility] Jonah Blue B, MD   1 year ago Type 2 diabetes mellitus with peripheral neuropathy [facility])   [place] [facility] Jonah Blue B, MD   1 year ago Type 2 diabetes mellitus with peripheral neuropathy [facility])   [place] [facility] Marcine Matar, MD   1 year  ago Pharyngitis, unspecified etiology   [place] [facility] [place], [place], [place]

## 2022-12-16 ENCOUNTER — Other Ambulatory Visit: Payer: Self-pay

## 2022-12-17 ENCOUNTER — Other Ambulatory Visit: Payer: Self-pay

## 2022-12-21 ENCOUNTER — Other Ambulatory Visit: Payer: Self-pay

## 2022-12-22 ENCOUNTER — Encounter: Payer: Self-pay | Admitting: Internal Medicine

## 2022-12-22 ENCOUNTER — Other Ambulatory Visit: Payer: Self-pay | Admitting: Internal Medicine

## 2022-12-22 DIAGNOSIS — K219 Gastro-esophageal reflux disease without esophagitis: Secondary | ICD-10-CM

## 2022-12-26 ENCOUNTER — Other Ambulatory Visit: Payer: Self-pay | Admitting: Internal Medicine

## 2022-12-26 DIAGNOSIS — E1159 Type 2 diabetes mellitus with other circulatory complications: Secondary | ICD-10-CM

## 2023-01-04 ENCOUNTER — Other Ambulatory Visit: Payer: Self-pay

## 2023-01-07 ENCOUNTER — Other Ambulatory Visit: Payer: Self-pay | Admitting: Internal Medicine

## 2023-01-07 DIAGNOSIS — I152 Hypertension secondary to endocrine disorders: Secondary | ICD-10-CM

## 2023-01-13 ENCOUNTER — Other Ambulatory Visit: Payer: Self-pay | Admitting: Internal Medicine

## 2023-01-13 ENCOUNTER — Other Ambulatory Visit: Payer: Self-pay

## 2023-01-13 DIAGNOSIS — E1159 Type 2 diabetes mellitus with other circulatory complications: Secondary | ICD-10-CM

## 2023-01-19 ENCOUNTER — Other Ambulatory Visit: Payer: Self-pay

## 2023-01-22 ENCOUNTER — Ambulatory Visit: Payer: Medicaid Other | Admitting: Internal Medicine

## 2023-01-27 ENCOUNTER — Other Ambulatory Visit: Payer: Self-pay | Admitting: Internal Medicine

## 2023-01-27 DIAGNOSIS — I152 Hypertension secondary to endocrine disorders: Secondary | ICD-10-CM

## 2023-02-01 ENCOUNTER — Other Ambulatory Visit: Payer: Self-pay | Admitting: Internal Medicine

## 2023-02-01 ENCOUNTER — Other Ambulatory Visit: Payer: Self-pay

## 2023-02-01 DIAGNOSIS — Z1231 Encounter for screening mammogram for malignant neoplasm of breast: Secondary | ICD-10-CM

## 2023-02-01 DIAGNOSIS — K219 Gastro-esophageal reflux disease without esophagitis: Secondary | ICD-10-CM

## 2023-02-04 ENCOUNTER — Other Ambulatory Visit: Payer: Self-pay

## 2023-02-11 ENCOUNTER — Encounter: Payer: Self-pay | Admitting: Internal Medicine

## 2023-02-11 ENCOUNTER — Other Ambulatory Visit: Payer: Self-pay | Admitting: Internal Medicine

## 2023-02-11 ENCOUNTER — Other Ambulatory Visit: Payer: Self-pay

## 2023-02-11 DIAGNOSIS — E1142 Type 2 diabetes mellitus with diabetic polyneuropathy: Secondary | ICD-10-CM

## 2023-02-11 MED ORDER — GABAPENTIN 300 MG PO CAPS
900.0000 mg | ORAL_CAPSULE | Freq: Every day | ORAL | 2 refills | Status: DC
  Filled 2023-02-11 – 2023-02-12 (×2): qty 90, 30d supply, fill #0
  Filled 2023-03-15: qty 90, 30d supply, fill #1
  Filled 2023-04-23: qty 90, 30d supply, fill #2

## 2023-02-12 ENCOUNTER — Other Ambulatory Visit: Payer: Self-pay

## 2023-02-12 ENCOUNTER — Encounter: Payer: Self-pay | Admitting: Internal Medicine

## 2023-02-12 ENCOUNTER — Ambulatory Visit (INDEPENDENT_AMBULATORY_CARE_PROVIDER_SITE_OTHER): Payer: Medicaid Other | Admitting: Internal Medicine

## 2023-02-12 VITALS — BP 132/80 | HR 81 | Ht 61.0 in | Wt 214.6 lb

## 2023-02-12 DIAGNOSIS — E1142 Type 2 diabetes mellitus with diabetic polyneuropathy: Secondary | ICD-10-CM

## 2023-02-12 DIAGNOSIS — E1169 Type 2 diabetes mellitus with other specified complication: Secondary | ICD-10-CM | POA: Diagnosis not present

## 2023-02-12 DIAGNOSIS — Z7984 Long term (current) use of oral hypoglycemic drugs: Secondary | ICD-10-CM

## 2023-02-12 DIAGNOSIS — E785 Hyperlipidemia, unspecified: Secondary | ICD-10-CM

## 2023-02-12 DIAGNOSIS — Z7985 Long-term (current) use of injectable non-insulin antidiabetic drugs: Secondary | ICD-10-CM

## 2023-02-12 LAB — POCT GLYCOSYLATED HEMOGLOBIN (HGB A1C): Hemoglobin A1C: 5.6 % (ref 4.0–5.6)

## 2023-02-12 MED ORDER — DAPAGLIFLOZIN PROPANEDIOL 5 MG PO TABS: 5.0000 mg | ORAL_TABLET | Freq: Every day | ORAL | 3 refills | Status: DC

## 2023-02-12 MED ORDER — OZEMPIC (2 MG/DOSE) 8 MG/3ML ~~LOC~~ SOPN: 2.0000 mg | PEN_INJECTOR | SUBCUTANEOUS | 3 refills | Status: DC

## 2023-02-12 MED ORDER — METFORMIN HCL ER 500 MG PO TB24: 2000.0000 mg | ORAL_TABLET | Freq: Every day | ORAL | 3 refills | Status: DC

## 2023-02-12 NOTE — Patient Instructions (Addendum)
Please continue: - Metformin ER 1000 mg 2x a day, with meals - Farxiga 5 mg before b'fast - Ozempic 2 mg weekly  Please decrease: - Lantus 100 units daily in am - try the thighs  Please return in 3-4 months.

## 2023-02-12 NOTE — Addendum Note (Signed)
Addended by: Tera Partridge on: 02/12/2023 11:33 AM   Modules accepted: Orders

## 2023-02-12 NOTE — Progress Notes (Signed)
Patient ID: Teresa Camacho, female   DOB: 1965/09/14, 57 y.o.   MRN: [id number]  HPI: Teresa Camacho is a 57 y.o.-year-old female, returning for follow-up for DM2, dx in 2021, insulin-dependent soon post dx., uncontrolled, with complications (PN). Pt. previously saw Dr. Everardo All, last visit with me 4 months ago.  She is here with her daughter, who is also my patient.  Interim history: No increased urination, blurry vision, nausea, chest pain. She has diarrhea immediately after she eats.  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  Reviewed HbA1c: Lab Results  Component Value Date   HGBA1C 6.4 (A) 10/09/2022   HGBA1C 6.2 (A) 06/10/2022   HGBA1C 6.6 (A) 02/06/2022   HGBA1C 7.3 (A) 10/06/2021   HGBA1C 7.3 (A) 07/30/2021   HGBA1C 8.9 (A) 05/28/2021   HGBA1C 8.0 (A) 03/26/2021   HGBA1C 8.2 (A) 01/24/2021   HGBA1C 8.8 (A) 11/05/2020   HGBA1C 8.4 (A) 08/19/2020   Pt is on a regimen of: - Metformin ER 1000 mg 2x a day, with meals - Farxiga 5 mg daily in am - ran out this week - Humulin NPH 150 >> 130 >> 100 >> 120 >> 100 >> 110 units in am >> Lantus 120 >> 140 units daily - Ozempic 0.25 >> 0.5 >> 1 mg weekly - tried a higher dose - diarrhea, belching (Mylanta did not help) >> 2 mg weekly -increase 09/2022 She was on Lantus before >> sugars high later in the day.  Pt checks her sugars 2x a day and they are: - am: 85, 112-156, 159, 177 >> 83-158 >> 85-139 >> 90-low 100 - 2h after b'fast: n/c - before lunch: 55 (delayed lunch) >> n/c >> 88, 99 >> n/c - 2h after lunch: n/c >> 153 >> n/c - before dinner: n/c >> 70-105 >> 111-139 >> n/c - 2h after dinner: 56, 79-155, 209 >> 118-245 >> 144-202 >> 130-150 - bedtime: n/c - nighttime: n/c Lowest sugar was 56 >> 82 >> 85>> 90; she has hypoglycemia awareness at 70.  Highest sugar was 266 >> 245 >> 202 >> 150.  Glucometer: True Metrix  - no CKD, last BUN/creatinine:  Lab Results  Component Value Date   BUN 9 03/16/2022   BUN 13  03/13/2021   CREATININE 0.72 03/16/2022   CREATININE 0.73 03/13/2021   Lab Results  Component Value Date   MICRALBCREAT 25 03/16/2022   MICRALBCREAT 14 03/13/2021   MICRALBCREAT 231 (H) 12/26/2019  She is on lisinopril 10 mg daily.  - +HL; last set of lipids: Lab Results  Component Value Date   CHOL 73 (L) 03/16/2022   HDL 40 03/16/2022   LDLCALC 15 03/16/2022   LDLDIRECT 95.5 12/02/2019   TRIG 87 03/16/2022   CHOLHDL 1.8 03/16/2022  She is on Lipitor 80 mg daily and Tricor 48 mg daily.  - last eye exam was 09/03/2022. No DR.   - + numbness and tingling in her feet.  She is on Neurontin 300 mg x 2 at bedtime.  Last foot exam 02/06/2022.  She has a history of PCOS, HTN, OSA, GERD, fibroids, s/p TAH.  ROS: + see HPI  Past Medical History:  Diagnosis Date   Anemia    Blood transfusion without reported diagnosis    Fibroids, submucosal 05/03/2013   GERD (gastroesophageal reflux disease)    Hyperlipidemia    Hypertension    PCOS (polycystic ovarian syndrome)    PONV (postoperative nausea and vomiting)    "  super tight airway" as per pt   Sleep apnea    Type II diabetes mellitus (HCC)    Past Surgical History:  Procedure Laterality Date   ABDOMINAL HYSTERECTOMY N/A 06/13/2013   Procedure: HYSTERECTOMY ABDOMINAL and removal of vaginal skin tag;  Surgeon: Turner Daniels, MD;  Location: [facility];  Service: Gynecology;  Laterality: N/A;   APPENDECTOMY     BALLOON DILATION N/A 08/27/2020   Procedure: BALLOON DILATION;  Surgeon: Hilarie Fredrickson, MD;  Location: [facility];  Service: Endoscopy;  Laterality: N/A;   CESAREAN SECTION     3x   CHOLECYSTECTOMY     CHOLECYSTECTOMY  1991   COLONOSCOPY WITH PROPOFOL N/A 08/27/2020   Procedure: COLONOSCOPY WITH PROPOFOL;  Surgeon: Hilarie Fredrickson, MD;  Location: [facility];  Service: Endoscopy;  Laterality: N/A;   ESOPHAGOGASTRODUODENOSCOPY (EGD) WITH PROPOFOL N/A 08/27/2020   Procedure: ESOPHAGOGASTRODUODENOSCOPY (EGD) WITH PROPOFOL;   Surgeon: Hilarie Fredrickson, MD;  Location: [facility];  Service: Endoscopy;  Laterality: N/A;   POLYPECTOMY  08/27/2020   Procedure: POLYPECTOMY;  Surgeon: Hilarie Fredrickson, MD;  Location: [facility];  Service: Endoscopy;;   Social History   Socioeconomic History   Marital status: Divorced    Spouse name: Not on file   Number of children: 5   Years of education: Not on file   Highest education level: Not on file  Occupational History   Not on file  Tobacco Use   Smoking status: Never   Smokeless tobacco: Never  Vaping Use   Vaping status: Never Used  Substance and Sexual Activity   Alcohol use: No   Drug use: No   Sexual activity: Not Currently  Other Topics Concern   Not on file  Social History Narrative   Not on file   Social Determinants of Health   Financial Resource Strain: Not on file  Food Insecurity: Not on file  Transportation Needs: Not on file  Physical Activity: Not on file  Stress: Not on file  Social Connections: Not on file  Intimate Partner Violence: Not on file   Current Outpatient Medications on File Prior to Visit  Medication Sig Dispense Refill   Accu-Chek Softclix Lancets lancets use one lancet to check blood glucose three times a day 100 each 2   albuterol (VENTOLIN HFA) 108 (90 Base) MCG/ACT inhaler Inhale 2 puffs into the lungs every 6 (six) hours as needed for wheezing or shortness of breath. 8 g 0   aspirin EC 81 MG tablet Take 1 tablet (81 mg total) by mouth daily. Swallow whole. (Patient taking differently: Take 81 mg by mouth in the morning. Swallow whole.) 100 tablet 2   atorvastatin (LIPITOR) 80 MG tablet TAKE 1 TABLET BY MOUTH DAILY 90 tablet 0   blood glucose meter kit and supplies KIT Dispense based on patient and insurance preference. Use up to four times daily as directed. (FOR ICD-9 250.00, 250.01). 1 each 0   Blood Glucose Monitoring Suppl (ACCU-CHEK GUIDE) w/Device KIT use to check blood glucose three times a day 1 kit 0    Cholecalciferol (VITAMIN D-3) 125 MCG (5000 UT) TABS Take 5,000 Units by mouth in the morning.     Continuous Glucose Receiver (FREESTYLE LIBRE 3 READER) DEVI Use as directed to check blood glucose. 1 each 0   Continuous Glucose Sensor (FREESTYLE LIBRE 3 SENSOR) MISC Change every 2 weeks 2 each 6   dapagliflozin propanediol (FARXIGA) 5 MG TABS tablet Take 1 tablet (5 mg total) by  mouth daily. 90 tablet 3   desloratadine (CLARINEX) 5 MG tablet Take 5 mg by mouth at bedtime.     diphenhydrAMINE (BENADRYL) 25 mg capsule Take 50 mg by mouth at bedtime.     fenofibrate (TRICOR) 48 MG tablet TAKE 1 TABLET BY MOUTH DAILY 90 tablet 1   gabapentin (NEURONTIN) 300 MG capsule Take 3 capsules (900 mg total) by mouth at bedtime. 90 capsule 2   glucose blood (ACCU-CHEK GUIDE) test strip use one test strip to check blood glucose three times a day 100 each 2   insulin glargine (LANTUS) 100 unit/mL SOPN Inject 100 Units into the skin daily. 90 mL 1   Insulin Pen Needle (TECHLITE PEN NEEDLES) 32G X 4 MM MISC Use in the morning and at bedtime 200 each 1   lisinopril (ZESTRIL) 10 MG tablet TAKE 1 TABLET BY MOUTH DAILY 30 tablet 0   loratadine (CLARITIN) 10 MG tablet Take 1 tablet (10 mg total) by mouth daily. 30 tablet 6   meclizine (ANTIVERT) 12.5 MG tablet Take 1 tablet (12.5 mg total) by mouth 2 (two) times daily as needed for dizziness. 60 tablet 3   Melatonin 10 MG SUBL Place 20 mg under the tongue at bedtime.     metFORMIN (GLUCOPHAGE-XR) 500 MG 24 hr tablet Take 4 tablets (2,000 mg total) by mouth daily. 360 tablet 3   metoprolol tartrate (LOPRESSOR) 25 MG tablet TAKE 1 TABLET BY MOUTH TWICE A DAY 180 tablet 0   Multiple Vitamins-Minerals (MULTIVITAMIN WITH MINERALS) tablet Take 1 tablet by mouth daily.     pantoprazole (PROTONIX) 40 MG tablet Take 1 tablet (40 mg total) by mouth daily. Office visit for further refills 180 tablet 0   Semaglutide, 1 MG/DOSE, (OZEMPIC, 1 MG/DOSE,) 4 MG/3ML SOPN Inject 2 mg into  the skin once a week. 9 mL 3   traZODone (DESYREL) 100 MG tablet Take 100 mg by mouth at bedtime.     No current facility-administered medications on file prior to visit.   Allergies  Allergen Reactions   Wound Dressing Adhesive Other (See Comments)    Redness/irritation   Codeine Nausea And Vomiting    Pt is able to take percocet & vicodin   Latex Rash    irritation   Family History  Problem Relation Age of Onset   Depression Mother    Diabetes Mother    Arthritis Mother    Heart disease Mother    Learning disabilities Mother    Mental illness Mother    Liver cancer Mother        metastatic from colon??   Arthritis Father    Prostate cancer Father        met to esophagus   Colon polyps Sister    Cancer Maternal Aunt        x 2 Aunts   Breast cancer Maternal Grandmother    PE: BP 132/80 (BP Location: Left Arm, Patient Position: Sitting, Cuff Size: Large)   Pulse 81   Ht 5\' 1"  (1.549 m)   Wt 214 lb 9.6 oz (97.3 kg)   LMP 03/13/2013   BMI 40.55 kg/m  Wt Readings from Last 3 Encounters:  02/12/23 214 lb 9.6 oz (97.3 kg)  10/09/22 216 lb 9.6 oz (98.2 kg)  08/14/22 215 lb (97.5 kg)   Constitutional: overweight, in NAD Eyes:  EOMI, no exophthalmos ENT: no neck masses, no cervical lymphadenopathy Cardiovascular: RRR, No MRG Respiratory: CTA B Musculoskeletal: no deformities Skin:no rashes Neurological: no tremor  with outstretched hands Diabetic Foot Exam - Simple   Simple Foot Form Diabetic Foot exam was performed with the following findings: Yes 02/12/2023  9:43 AM  Visual Inspection No deformities, no ulcerations, no other skin breakdown bilaterally: Yes Sensation Testing Intact to touch and monofilament testing bilaterally: Yes Pulse Check Posterior Tibialis and Dorsalis pulse intact bilaterally: Yes Comments    ASSESSMENT: 1. DM2, insulin-dependent, uncontrolled, with complications - PN  2. HL  PLAN:  1. Patient with longstanding, uncontrolled, type  2 diabetes, on oral antidiabetic regimen with metformin and SGLT2 inhibitor along with a high dose of long-acting insulin and weekly GLP-1 receptor agonist.  Latest HbA1c was slightly higher, but still at goal, at 6.4%.  She was interested in increasing Ozempic even though she did have GI side effects when she was taking more in the past.  I sent a prescription for the 2 mg Ozempic to her pharmacy.  Her sugars at that time are mostly at goal with only 1 blood sugar in the low 200s. - at today's visit, sugars remain at goal without hyperglycemic excursions or hypoglycemic episodes.  However, upon questioning, she is using a higher dose of Lantus, 140 units in the morning.  She tried to reduce the dose and also to split the dose with subsequently increased blood sugars.  Upon questioning, she is using her abdomen for injections and despite the fact that she is rotating the sites, I suspect that she has scar tissue at the injection site.  We discussed about switching the Lantus injections to upper thighs and I advised her to reduce the dose of Lantus when she switches the site.  Otherwise, we can continue the current regimen.  She tolerates Ozempic well.  She is inquiring about Mounjaro, however, she has exaggerated gastrocolic effect and for now I did not recommend it.  I did recommend to possibly see GI for this. - I suggested to:  Patient Instructions  Please continue: - Metformin ER 1000 mg 2x a day, with meals - Farxiga 5 mg before b'fast - Ozempic 2 mg weekly  Please decrease: - Lantus 100 units daily in am - try the thighs  Please return in 3-4 months.   - we checked her HbA1c: 5.6% (lower) - advised to check sugars at different times of the day - 1x a day, rotating check times - advised for yearly eye exams >> she is UTD - return to clinic in 3-4 months  2. HL -Reviewed latest lipid panel from 02/2022: Fractions at goal: Lab Results  Component Value Date   CHOL 73 (L) 03/16/2022   HDL 40  03/16/2022   LDLCALC 15 03/16/2022   LDLDIRECT 95.5 12/02/2019   TRIG 87 03/16/2022   CHOLHDL 1.8 03/16/2022  -She continues Lipitor 80 mg daily and Tricor 40 mg daily without side effects -She is due for another lipid panel -she has an annual physical exam coming up with PCP on 03/15/2023  Carlus Pavlov, MD PhD [facility] Endocrinology

## 2023-02-13 ENCOUNTER — Other Ambulatory Visit: Payer: Self-pay | Admitting: Internal Medicine

## 2023-02-13 DIAGNOSIS — E1169 Type 2 diabetes mellitus with other specified complication: Secondary | ICD-10-CM

## 2023-02-15 NOTE — Telephone Encounter (Signed)
Requested medication (s) are due for refill today:yes  Requested medication (s) are on the active medication list: yes  Last refill:  11/11/22  Future visit scheduled: yes  Notes to clinic:  previous note notation on reorder "Must have office visit for refills"   Requested Prescriptions  Pending Prescriptions Disp Refills   atorvastatin (LIPITOR) 80 MG tablet [Pharmacy Med Name: ATORVASTATIN 80 MG TABLET] 90 tablet 0    Sig: TAKE 1 TABLET BY MOUTH DAILY     Cardiovascular:  Antilipid - Statins Failed - 02/13/2023  8:51 AM      Failed - Lipid Panel in normal range within the last 12 months    Cholesterol, Total  Date Value Ref Range Status  03/16/2022 73 (L) 100 - 199 mg/dL Final   LDL Chol Calc (NIH)  Date Value Ref Range Status  03/16/2022 15 0 - 99 mg/dL Final   Direct LDL  Date Value Ref Range Status  12/02/2019 95.5 0 - 99 mg/dL Final    Comment:    Performed at [facility], [address], [place], [place] [address]   HDL  Date Value Ref Range Status  03/16/2022 40 >39 mg/dL Final   Triglycerides  Date Value Ref Range Status  03/16/2022 87 0 - 149 mg/dL Final         Passed - Patient is not pregnant      Passed - Valid encounter within last 12 months    Recent Outpatient Visits           6 months ago Type 2 diabetes mellitus with morbid obesity (HCC)   [place] [facility] Jonah Blue B, MD   11 months ago Benign paroxysmal positional vertigo, unspecified laterality   [place] [facility] Jonah Blue B, MD   1 year ago Type 2 diabetes mellitus with peripheral neuropathy [facility])   [place] [facility] Jonah Blue B, MD   1 year ago Type 2 diabetes mellitus with peripheral neuropathy [facility])   [place] [facility] Marcine Matar, MD   1 year ago Pharyngitis, unspecified etiology    [facility] [place],  Kasandra Knudsen, [place]       Future Appointments             In 4 weeks Laural Benes, Binnie Rail, MD [facility]

## 2023-02-16 ENCOUNTER — Telehealth: Payer: Self-pay | Admitting: Internal Medicine

## 2023-02-16 ENCOUNTER — Other Ambulatory Visit: Payer: Self-pay | Admitting: Internal Medicine

## 2023-02-16 ENCOUNTER — Encounter: Payer: Self-pay | Admitting: Internal Medicine

## 2023-02-16 DIAGNOSIS — K219 Gastro-esophageal reflux disease without esophagitis: Secondary | ICD-10-CM

## 2023-02-16 DIAGNOSIS — E1169 Type 2 diabetes mellitus with other specified complication: Secondary | ICD-10-CM

## 2023-02-16 NOTE — Telephone Encounter (Signed)
Patient called stated her last Pantoprazole refill had the incorrect dosage and did not realize it until she noticed she ran out of them too early for next refill. Please advise.

## 2023-02-17 MED ORDER — PANTOPRAZOLE SODIUM 40 MG PO TBEC: 40.0000 mg | DELAYED_RELEASE_TABLET | Freq: Every day | ORAL | 0 refills | Status: DC

## 2023-02-17 NOTE — Telephone Encounter (Signed)
Refilled Pantoprazole 

## 2023-02-18 ENCOUNTER — Other Ambulatory Visit: Payer: Self-pay

## 2023-02-18 ENCOUNTER — Other Ambulatory Visit: Payer: Self-pay | Admitting: Internal Medicine

## 2023-02-18 ENCOUNTER — Encounter: Payer: Self-pay | Admitting: Internal Medicine

## 2023-02-18 ENCOUNTER — Telehealth: Payer: Self-pay

## 2023-02-18 DIAGNOSIS — E1142 Type 2 diabetes mellitus with diabetic polyneuropathy: Secondary | ICD-10-CM

## 2023-02-18 NOTE — Telephone Encounter (Signed)
PA needed for Farxiga per patient sent to PA team

## 2023-02-19 ENCOUNTER — Other Ambulatory Visit: Payer: Self-pay | Admitting: Internal Medicine

## 2023-02-19 ENCOUNTER — Other Ambulatory Visit: Payer: Self-pay

## 2023-02-19 ENCOUNTER — Encounter: Payer: Self-pay | Admitting: Internal Medicine

## 2023-02-19 DIAGNOSIS — E1142 Type 2 diabetes mellitus with diabetic polyneuropathy: Secondary | ICD-10-CM

## 2023-02-19 MED ORDER — INSULIN GLARGINE 100 UNITS/ML SOLOSTAR PEN
110.0000 [IU] | PEN_INJECTOR | Freq: Every day | SUBCUTANEOUS | 3 refills | Status: DC
Start: 2023-02-19 — End: 2023-02-22
  Filled 2023-02-19: qty 90, 75d supply, fill #0

## 2023-02-22 ENCOUNTER — Other Ambulatory Visit: Payer: Self-pay | Admitting: Internal Medicine

## 2023-02-22 ENCOUNTER — Telehealth: Payer: Self-pay

## 2023-02-22 ENCOUNTER — Other Ambulatory Visit (HOSPITAL_COMMUNITY): Payer: Self-pay

## 2023-02-22 ENCOUNTER — Other Ambulatory Visit: Payer: Self-pay

## 2023-02-22 DIAGNOSIS — E1142 Type 2 diabetes mellitus with diabetic polyneuropathy: Secondary | ICD-10-CM

## 2023-02-22 MED ORDER — ACCU-CHEK SOFTCLIX LANCETS MISC
0 refills | Status: DC
  Filled 2023-02-22: qty 100, 25d supply, fill #0

## 2023-02-22 MED ORDER — INSULIN GLARGINE 100 UNITS/ML SOLOSTAR PEN
110.0000 [IU] | PEN_INJECTOR | Freq: Every day | SUBCUTANEOUS | 3 refills | Status: DC
Start: 2023-02-22 — End: 2023-12-16
  Filled 2023-02-22: qty 30, 25d supply, fill #0
  Filled 2023-02-25: qty 36, 30d supply, fill #0
  Filled 2023-03-26 – 2023-04-07 (×2): qty 36, 30d supply, fill #1
  Filled 2023-05-06 – 2023-05-07 (×2): qty 36, 30d supply, fill #2
  Filled 2023-06-09 (×2): qty 36, 30d supply, fill #3

## 2023-02-22 NOTE — Telephone Encounter (Signed)
Pharmacy Patient Advocate Encounter   Received notification from Pt Calls Messages that prior authorization for Marcelline Deist is required/requested.   Insurance verification completed.   The patient is insured through [facility] .   Per test claim: PA required; PA submitted to [facility] via CoverMyMeds Key/confirmation #/[id number] Status is pending

## 2023-02-24 ENCOUNTER — Other Ambulatory Visit: Payer: Self-pay

## 2023-02-24 MED ORDER — TECHLITE PLUS PEN NEEDLES 32G X 4 MM MISC
1.0000 | Freq: Two times a day (BID) | 1 refills | Status: DC
Start: 2023-02-24 — End: 2023-09-22
  Filled 2023-02-24: qty 200, 100d supply, fill #0
  Filled 2023-03-26 – 2023-03-29 (×2): qty 100, 50d supply, fill #0
  Filled 2023-05-06 – 2023-05-07 (×2): qty 100, 50d supply, fill #1
  Filled 2023-06-09: qty 100, 50d supply, fill #2

## 2023-02-25 ENCOUNTER — Other Ambulatory Visit: Payer: Self-pay

## 2023-03-01 ENCOUNTER — Ambulatory Visit: Payer: Medicaid Other | Attending: Family Medicine | Admitting: Internal Medicine

## 2023-03-01 ENCOUNTER — Ambulatory Visit
Admission: RE | Admit: 2023-03-01 | Discharge: 2023-03-01 | Disposition: A | Discharge status: Home / Self Care | Payer: Medicaid Other | Source: Ambulatory Visit | Attending: Internal Medicine | Admitting: Internal Medicine

## 2023-03-01 ENCOUNTER — Other Ambulatory Visit: Payer: Self-pay

## 2023-03-01 ENCOUNTER — Other Ambulatory Visit (HOSPITAL_COMMUNITY): Payer: Self-pay

## 2023-03-01 ENCOUNTER — Encounter: Payer: Self-pay | Admitting: Internal Medicine

## 2023-03-01 DIAGNOSIS — E785 Hyperlipidemia, unspecified: Secondary | ICD-10-CM

## 2023-03-01 DIAGNOSIS — R197 Diarrhea, unspecified: Secondary | ICD-10-CM

## 2023-03-01 DIAGNOSIS — Z23 Encounter for immunization: Secondary | ICD-10-CM

## 2023-03-01 DIAGNOSIS — Z7985 Long-term (current) use of injectable non-insulin antidiabetic drugs: Secondary | ICD-10-CM

## 2023-03-01 DIAGNOSIS — K219 Gastro-esophageal reflux disease without esophagitis: Secondary | ICD-10-CM

## 2023-03-01 DIAGNOSIS — E1169 Type 2 diabetes mellitus with other specified complication: Secondary | ICD-10-CM | POA: Diagnosis not present

## 2023-03-01 DIAGNOSIS — G4700 Insomnia, unspecified: Secondary | ICD-10-CM

## 2023-03-01 DIAGNOSIS — Z794 Long term (current) use of insulin: Secondary | ICD-10-CM

## 2023-03-01 DIAGNOSIS — E119 Type 2 diabetes mellitus without complications: Secondary | ICD-10-CM

## 2023-03-01 DIAGNOSIS — I152 Hypertension secondary to endocrine disorders: Secondary | ICD-10-CM

## 2023-03-01 DIAGNOSIS — E1159 Type 2 diabetes mellitus with other circulatory complications: Secondary | ICD-10-CM | POA: Diagnosis not present

## 2023-03-01 DIAGNOSIS — Z7984 Long term (current) use of oral hypoglycemic drugs: Secondary | ICD-10-CM

## 2023-03-01 DIAGNOSIS — Z1231 Encounter for screening mammogram for malignant neoplasm of breast: Secondary | ICD-10-CM

## 2023-03-01 MED ORDER — TRAZODONE HCL 100 MG PO TABS
100.0000 mg | ORAL_TABLET | Freq: Every day | ORAL | 1 refills | Status: DC
Start: 2023-03-01 — End: 2023-08-10

## 2023-03-01 MED ORDER — PANTOPRAZOLE SODIUM 40 MG PO TBEC
40.0000 mg | DELAYED_RELEASE_TABLET | Freq: Two times a day (BID) | ORAL | 2 refills | Status: DC
Start: 2023-03-01 — End: 2023-12-07

## 2023-03-01 MED ORDER — ATORVASTATIN CALCIUM 80 MG PO TABS
80.0000 mg | ORAL_TABLET | Freq: Every day | ORAL | 1 refills | Status: DC
Start: 2023-03-01 — End: 2023-09-17

## 2023-03-01 MED ORDER — COVID-19 MRNA VAC-TRIS(PFIZER) 30 MCG/0.3ML IM SUSY
0.3000 mL | PREFILLED_SYRINGE | Freq: Once | INTRAMUSCULAR | 0 refills | Status: AC
  Filled 2023-03-01: qty 0.3, 1d supply, fill #0

## 2023-03-01 NOTE — Progress Notes (Signed)
Patient ID: [name], female    DOB: [birth year]  MRN: [id number]  CC: Hypertension (HTN & DM f/u. Med refills/Yes to flu vax. )   Subjective: Teresa Camacho is a 57 y.o. female who presents for chronic ds management. Her concerns today include:  Patient with history of DM type II with microalbumin, HTN, HL, OSA on CPAP, GERD,PCOS, pattern baldness, RLS (Gabapentin).   DM: Lab Results  Component Value Date   HGBA1C 5.6 02/12/2023  Patient continues to be followed by endocrinologist Dr. Elvera Lennox.  Last saw her on 02/02/2023.  Patient was continued on metformin ER 1 g twice a day, Farxiga 5 mg daily, Ozempic 2 mg once a week.  Lantus insulin decreased to 100 units daily. Pt states that since then she had to increase dose of insulin to 120 units because BS had started increasing.  No low blood sugar episodes.  Just got the libre continuous glucose monitor. -Down additional 4 pounds since May.  Does well with eating habits.  Job is active, she walks her dog in the neighborhood; does about [phone] steps a day.  Does not feel the Ozempic has decrease appetite.  Reports diarrhea with anything she eats x 2 mths.  Pt herself and reports endocrinologist do not think it is the Metformin. Has been on Metformin for years without having this issue and Ozempic for 2 yrs.  Pt has been trying to get in with GI.   HL: Reports compliance with Lipitor 80 mg daily and Tricor 40 mg daily.  Due for lipid profile today. Last LDL was 15.  HTN: Taking lisinopril 10 mg daily and metoprolol 25 mg twice a day.  She limits salt in the foods. Does not check BP, no device  Request refill on pantoprazole for acid reflux. Reports she takes 40 mg BID not once a day; taking it once a day does not control her acid reflux.  When the prescription was written for once daily dosing, she started taking Pepcid in the evenings and this still was not holding her.  Request refill on trazodone for insomnia.  HM:  yes to flu and  COVID  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   History of allergy 12/26/2019   OSA on CPAP 12/26/2019   Class 3 severe obesity due to excess calories with serious comorbidity and body mass index (BMI) of 40.0 to 44.9 in adult [facility]) 12/26/2019   Hyperlipidemia associated with type 2 diabetes mellitus (HCC) 12/26/2019   Essential hypertension 12/26/2019   Family history of colonic polyps 12/26/2019   Gastroesophageal reflux disease 12/26/2019   Type 2 diabetes mellitus with hyperglycemia, with long-term current use of insulin (HCC) 11/30/2019   Iron deficiency anemia due to chronic blood loss 06/28/2013   S/P hysterectomy 06/13/2013     Current Outpatient Medications on File Prior to Visit  Medication Sig Dispense Refill   Accu-Chek Softclix Lancets lancets use one lancet to check blood glucose three times a day 100 each 0   albuterol (VENTOLIN HFA) 108 (90 Base) MCG/ACT inhaler Inhale 2 puffs into the lungs every 6 (six) hours as needed for wheezing or shortness of breath. 8 g 0   aspirin EC 81 MG tablet Take 1 tablet (81 mg total)  by mouth daily. Swallow whole. (Patient taking differently: Take 81 mg by mouth in the morning. Swallow whole.) 100 tablet 2   atorvastatin (LIPITOR) 80 MG tablet TAKE 1 TABLET BY MOUTH DAILY 30 tablet 0   blood glucose meter kit and supplies KIT Dispense based on patient and insurance preference. Use up to four times daily as directed. (FOR ICD-9 250.00, 250.01). 1 each 0   Blood Glucose Monitoring Suppl (ACCU-CHEK GUIDE) w/Device KIT use to check blood glucose three times a day 1 kit 0   Cholecalciferol (VITAMIN D-3) 125 MCG (5000 UT) TABS Take 5,000 Units by mouth in the morning.     Continuous Glucose Receiver (FREESTYLE LIBRE 3 READER)  DEVI Use as directed to check blood glucose. 1 each 0   Continuous Glucose Sensor (FREESTYLE LIBRE 3 SENSOR) MISC Change every 2 weeks 2 each 6   dapagliflozin propanediol (FARXIGA) 5 MG TABS tablet Take 1 tablet (5 mg total) by mouth daily. 90 tablet 3   desloratadine (CLARINEX) 5 MG tablet Take 5 mg by mouth at bedtime.     diphenhydrAMINE (BENADRYL) 25 mg capsule Take 50 mg by mouth at bedtime.     fenofibrate (TRICOR) 48 MG tablet TAKE 1 TABLET BY MOUTH DAILY 90 tablet 1   gabapentin (NEURONTIN) 300 MG capsule Take 3 capsules (900 mg total) by mouth at bedtime. 90 capsule 2   glucose blood (ACCU-CHEK GUIDE) test strip use one test strip to check blood glucose three times a day 100 each 2   insulin glargine (LANTUS) 100 unit/mL SOPN Inject 110-120 Units into the skin daily. 90 mL 3   Insulin Pen Needle (TECHLITE PLUS PEN NEEDLES) 32G X 4 MM MISC Use in the morning and at bedtime 200 each 1   lisinopril (ZESTRIL) 10 MG tablet TAKE 1 TABLET BY MOUTH DAILY 30 tablet 0   loratadine (CLARITIN) 10 MG tablet Take 1 tablet (10 mg total) by mouth daily. 30 tablet 6   meclizine (ANTIVERT) 12.5 MG tablet Take 1 tablet (12.5 mg total) by mouth 2 (two) times daily as needed for dizziness. 60 tablet 3   Melatonin 10 MG SUBL Place 20 mg under the tongue at bedtime.     metFORMIN (GLUCOPHAGE-XR) 500 MG 24 hr tablet Take 4 tablets (2,000 mg total) by mouth daily. 360 tablet 3   metoprolol tartrate (LOPRESSOR) 25 MG tablet TAKE 1 TABLET BY MOUTH TWICE A DAY 180 tablet 0   Multiple Vitamins-Minerals (MULTIVITAMIN WITH MINERALS) tablet Take 1 tablet by mouth daily.     pantoprazole (PROTONIX) 40 MG tablet Take 1 tablet (40 mg total) by mouth daily. Office visit for further refills 180 tablet 0   Semaglutide, 2 MG/DOSE, (OZEMPIC, 2 MG/DOSE,) 8 MG/3ML SOPN Inject 2 mg into the skin once a week. 9 mL 3   traZODone (DESYREL) 100 MG tablet Take 100 mg by mouth at bedtime.     No current facility-administered  medications on file prior to visit.    Allergies  Allergen Reactions   Wound Dressing Adhesive Other (See Comments)    Redness/irritation   Codeine Nausea And Vomiting    Pt is able to take percocet & vicodin   Latex Rash    irritation    Social History   Socioeconomic History   Marital status: Divorced    Spouse name: Not on file   Number of children: 5   Years of education: Not on file   Highest education level: Not on file  Occupational  History   Not on file  Tobacco Use   Smoking status: Never   Smokeless tobacco: Never  Vaping Use   Vaping status: Never Used  Substance and Sexual Activity   Alcohol use: No   Drug use: No   Sexual activity: Not Currently  Other Topics Concern   Not on file  Social History Narrative   Not on file   Social Determinants of Health   Financial Resource Strain: Not on file  Food Insecurity: Not on file  Transportation Needs: Not on file  Physical Activity: Not on file  Stress: Not on file  Social Connections: Not on file  Intimate Partner Violence: Not on file    Family History  Problem Relation Age of Onset   Depression Mother    Diabetes Mother    Arthritis Mother    Heart disease Mother    Learning disabilities Mother    Mental illness Mother    Liver cancer Mother        metastatic from colon??   Arthritis Father    Prostate cancer Father        met to esophagus   Colon polyps Sister    Cancer Maternal Aunt        x 2 Aunts   Breast cancer Maternal Grandmother     Past Surgical History:  Procedure Laterality Date   ABDOMINAL HYSTERECTOMY N/A 06/13/2013   Procedure: HYSTERECTOMY ABDOMINAL and removal of vaginal skin tag;  Surgeon: Turner Daniels, MD;  Location: [facility];  Service: Gynecology;  Laterality: N/A;   APPENDECTOMY     BALLOON DILATION N/A 08/27/2020   Procedure: BALLOON DILATION;  Surgeon: Hilarie Fredrickson, MD;  Location: [facility];  Service: Endoscopy;  Laterality: N/A;   CESAREAN SECTION     3x    CHOLECYSTECTOMY     CHOLECYSTECTOMY  1991   COLONOSCOPY WITH PROPOFOL N/A 08/27/2020   Procedure: COLONOSCOPY WITH PROPOFOL;  Surgeon: Hilarie Fredrickson, MD;  Location: [facility];  Service: Endoscopy;  Laterality: N/A;   ESOPHAGOGASTRODUODENOSCOPY (EGD) WITH PROPOFOL N/A 08/27/2020   Procedure: ESOPHAGOGASTRODUODENOSCOPY (EGD) WITH PROPOFOL;  Surgeon: Hilarie Fredrickson, MD;  Location: [facility];  Service: Endoscopy;  Laterality: N/A;   POLYPECTOMY  08/27/2020   Procedure: POLYPECTOMY;  Surgeon: Hilarie Fredrickson, MD;  Location: [facility];  Service: Endoscopy;;    ROS: Review of Systems Negative except as stated above  PHYSICAL EXAM: BP 100/67 (BP Location: Left Arm, Patient Position: Sitting, Cuff Size: Large)   Pulse 81   Ht 5\' 1"  (1.549 m)   Wt 212 lb (96.2 kg)   LMP 03/13/2013   SpO2 98%   BMI 40.06 kg/m   Wt Readings from Last 3 Encounters:  03/01/23 212 lb (96.2 kg)  02/12/23 214 lb 9.6 oz (97.3 kg)  10/09/22 216 lb 9.6 oz (98.2 kg)    Physical Exam  General appearance - alert, well appearing, obese older Caucasian female and in no distress Mental status - normal mood, behavior, speech, dress, motor activity, and thought processes Chest - clear to auscultation, no wheezes, rales or rhonchi, symmetric air entry Heart - normal rate, regular rhythm, normal S1, S2, no murmurs, rubs, clicks or gallops Extremities - peripheral pulses normal, no pedal edema, no clubbing or cyanosis      Latest Ref Rng & Units 03/16/2022   11:58 AM 03/13/2021   11:08 AM 12/02/2019    6:48 AM  CMP  Glucose 70 - 99 mg/dL 72  914  323   BUN 6 - 24 mg/dL 9  13  10    Creatinine 0.57 - 1.00 mg/dL 3.08  6.57  8.46   Sodium 134 - 144 mmol/L 141  140  137   Potassium 3.5 - 5.2 mmol/L 4.2  4.6  3.8   Chloride 96 - 106 mmol/L 103  103  103   CO2 20 - 29 mmol/L 22  18  21    Calcium 8.7 - 10.2 mg/dL 9.7  96.2  8.9   Total Protein 6.0 - 8.5 g/dL 6.9  7.2    Total Bilirubin 0.0 - 1.2 mg/dL 0.8  0.9     Alkaline Phos 44 - 121 IU/L 69  81    AST 0 - 40 IU/L 23  26    ALT 0 - 32 IU/L 29  36     Lipid Panel     Component Value Date/Time   CHOL 73 (L) 03/16/2022 1158   TRIG 87 03/16/2022 1158   HDL 40 03/16/2022 1158   CHOLHDL 1.8 03/16/2022 1158   CHOLHDL 8.1 12/02/2019 0648   VLDL UNABLE TO CALCULATE IF TRIGLYCERIDE OVER 400 mg/dL 95/28/4132 4401   LDLCALC 15 03/16/2022 1158   LDLDIRECT 95.5 12/02/2019 0648    CBC    Component Value Date/Time   WBC 10.7 03/16/2022 1158   WBC 7.9 12/01/2019 0537   RBC 5.21 03/16/2022 1158   RBC 5.08 12/01/2019 0537   HGB 14.3 03/16/2022 1158   HCT 44.0 03/16/2022 1158   PLT 334 03/16/2022 1158   MCV 85 03/16/2022 1158   MCH 27.4 03/16/2022 1158   MCH 28.7 12/01/2019 0537   MCHC 32.5 03/16/2022 1158   MCHC 32.4 12/01/2019 0537   RDW 13.9 03/16/2022 1158   LYMPHSABS 3.2 06/14/2013 2217   MONOABS 1.0 06/14/2013 2217   EOSABS 0.4 06/14/2013 2217   BASOSABS 0.0 06/14/2013 2217    ASSESSMENT AND PLAN: 1. Type 2 diabetes mellitus with morbid obesity (HCC) Patient's A1c is at goal. Discussed with her that metformin and Ozempic can cause diarrhea.  However she does not think that either of these medications are causing her symptoms that started just 2 months ago given that she has been on both medicines for a while.  I have submitted a referral for her to see a gastroenterologist.  She will continue current dose of Lantus insulin 120 units daily, Farxiga, Ozempic 2 mg once a week as prescribed by her endocrinologist - CBC - Comprehensive metabolic panel - Lipid panel - Microalbumin / creatinine urine ratio - Magnesium  2. Hyperlipidemia associated with type 2 diabetes mellitus (HCC) Continue atorvastatin and Tricor.  Due for lipid profile today. - atorvastatin (LIPITOR) 80 MG tablet; Take 1 tablet (80 mg total) by mouth daily.  Dispense: 90 tablet; Refill: 1  3. Hypertension associated with diabetes (HCC) At goal.  Continue lisinopril 10  mg daily and metoprolol 25 mg twice a day.  4. Gastroesophageal reflux disease without esophagitis Patient advised that PPIs can also cause diarrhea and other side effects including low magnesium.  We tried to treat with the lowest effective dose.  She reports that once daily dosing does not control her symptoms adequately.  She will continue pantoprazole 40 mg twice a day. - pantoprazole (PROTONIX) 40 MG tablet; Take 1 tablet (40 mg total) by mouth 2 (two) times daily before a meal. Office visit for further refills  Dispense: 180 tablet; Refill: 2  5. Diarrhea, unspecified type See discussion above. - Ambulatory  referral to Gastroenterology - Magnesium  6. Insomnia, unspecified type - traZODone (DESYREL) 100 MG tablet; Take 1 tablet (100 mg total) by mouth at bedtime.  Dispense: 90 tablet; Refill: 1  7. Encounter for immunization - Flu vaccine trivalent PF, 6mos and older(Flulaval,Afluria,Fluarix,Fluzone)  8. Need for first booster dose of COVID-19 vaccine Patient will get this from any outside pharmacy  9. Insulin long-term use (HCC) 10. Long-term (current) use of injectable non-insulin antidiabetic drugs 11. Diabetes mellitus treated with oral medication (HCC) See #1 above.   Patient was given the opportunity to ask questions.  Patient verbalized understanding of the plan and was able to repeat key elements of the plan.   This documentation was completed using Paediatric nurse.  Any transcriptional errors are unintentional.  No orders of the defined types were placed in this encounter.    Requested Prescriptions    No prescriptions requested or ordered in this encounter    No follow-ups on file.  Jonah Blue, MD, FACP

## 2023-03-02 NOTE — Telephone Encounter (Signed)
Pharmacy Patient Advocate Encounter  Received notification from [facility] that Prior Authorization for Teresa Camacho has been APPROVED 02/22/2024   PA #/Case ID/Reference #: [id number]

## 2023-03-03 LAB — COMPREHENSIVE METABOLIC PANEL
ALT: 23 [IU]/L (ref 0–32)
AST: 21 [IU]/L (ref 0–40)
Albumin: 4.4 g/dL (ref 3.8–4.9)
Alkaline Phosphatase: 52 [IU]/L (ref 44–121)
BUN/Creatinine Ratio: 17 (ref 9–23)
BUN: 13 mg/dL (ref 6–24)
Bilirubin Total: 0.5 mg/dL (ref 0.0–1.2)
CO2: 23 mmol/L (ref 20–29)
Calcium: 9.5 mg/dL (ref 8.7–10.2)
Chloride: 104 mmol/L (ref 96–106)
Creatinine, Ser: 0.76 mg/dL (ref 0.57–1.00)
Globulin, Total: 2 g/dL (ref 1.5–4.5)
Glucose: 116 mg/dL — ABNORMAL HIGH (ref 70–99)
Potassium: 4.4 mmol/L (ref 3.5–5.2)
Sodium: 141 mmol/L (ref 134–144)
Total Protein: 6.4 g/dL (ref 6.0–8.5)
eGFR: 92 mL/min/{1.73_m2} (ref >59)

## 2023-03-03 LAB — MICROALBUMIN / CREATININE URINE RATIO
Creatinine, Urine: 83.6 mg/dL
Microalb/Creat Ratio: 30 mg/g{creat} — ABNORMAL HIGH (ref 0–29)
Microalbumin, Urine: 25 ug/mL

## 2023-03-03 LAB — CBC
Hematocrit: 41.3 % (ref 34.0–46.6)
Hemoglobin: 13.2 g/dL (ref 11.1–15.9)
MCH: 27.6 pg (ref 26.6–33.0)
MCHC: 32 g/dL (ref 31.5–35.7)
MCV: 86 fL (ref 79–97)
Platelets: 317 10*3/uL (ref 150–450)
RBC: 4.78 x10E6/uL (ref 3.77–5.28)
RDW: 13.9 % (ref 11.7–15.4)
WBC: 8.6 10*3/uL (ref 3.4–10.8)

## 2023-03-03 LAB — LIPID PANEL
Chol/HDL Ratio: 2.3 {ratio} (ref 0.0–4.4)
Cholesterol, Total: 86 mg/dL — ABNORMAL LOW (ref 100–199)
HDL: 37 mg/dL — ABNORMAL LOW (ref >39)
LDL Chol Calc (NIH): 29 mg/dL (ref 0–99)
Triglycerides: 103 mg/dL (ref 0–149)
VLDL Cholesterol Cal: 20 mg/dL (ref 5–40)

## 2023-03-03 LAB — MAGNESIUM: Magnesium: 1.8 mg/dL (ref 1.6–2.3)

## 2023-03-09 ENCOUNTER — Other Ambulatory Visit: Payer: Self-pay

## 2023-03-15 ENCOUNTER — Ambulatory Visit: Payer: Medicaid Other | Admitting: Internal Medicine

## 2023-03-15 ENCOUNTER — Other Ambulatory Visit: Payer: Self-pay

## 2023-03-16 ENCOUNTER — Other Ambulatory Visit: Payer: Self-pay

## 2023-03-17 ENCOUNTER — Other Ambulatory Visit: Payer: Self-pay

## 2023-03-18 ENCOUNTER — Other Ambulatory Visit: Payer: Self-pay

## 2023-03-18 ENCOUNTER — Other Ambulatory Visit: Payer: Self-pay | Admitting: Internal Medicine

## 2023-03-18 DIAGNOSIS — K219 Gastro-esophageal reflux disease without esophagitis: Secondary | ICD-10-CM

## 2023-03-19 ENCOUNTER — Other Ambulatory Visit: Payer: Self-pay

## 2023-03-26 ENCOUNTER — Other Ambulatory Visit: Payer: Self-pay | Admitting: Internal Medicine

## 2023-03-26 ENCOUNTER — Other Ambulatory Visit: Payer: Self-pay

## 2023-03-26 ENCOUNTER — Encounter: Payer: Self-pay | Admitting: Internal Medicine

## 2023-03-26 DIAGNOSIS — I152 Hypertension secondary to endocrine disorders: Secondary | ICD-10-CM

## 2023-03-26 MED ORDER — LISINOPRIL 10 MG PO TABS: 10.0000 mg | ORAL_TABLET | Freq: Every day | ORAL | 3 refills | Status: AC

## 2023-03-26 MED ORDER — LISINOPRIL 10 MG PO TABS: 10.0000 mg | ORAL_TABLET | Freq: Every day | ORAL | 1 refills | Status: DC

## 2023-03-29 ENCOUNTER — Other Ambulatory Visit: Payer: Self-pay

## 2023-03-30 ENCOUNTER — Other Ambulatory Visit: Payer: Self-pay

## 2023-03-30 ENCOUNTER — Other Ambulatory Visit: Payer: Self-pay | Admitting: Internal Medicine

## 2023-03-30 DIAGNOSIS — E1169 Type 2 diabetes mellitus with other specified complication: Secondary | ICD-10-CM

## 2023-04-07 ENCOUNTER — Other Ambulatory Visit: Payer: Self-pay

## 2023-04-07 ENCOUNTER — Other Ambulatory Visit: Payer: Self-pay | Admitting: Internal Medicine

## 2023-04-07 DIAGNOSIS — E1159 Type 2 diabetes mellitus with other circulatory complications: Secondary | ICD-10-CM

## 2023-04-23 ENCOUNTER — Other Ambulatory Visit: Payer: Self-pay

## 2023-05-06 ENCOUNTER — Encounter: Payer: Self-pay | Admitting: Internal Medicine

## 2023-05-06 ENCOUNTER — Ambulatory Visit: Payer: Self-pay

## 2023-05-06 ENCOUNTER — Other Ambulatory Visit: Payer: Self-pay

## 2023-05-06 MED ORDER — NIRMATRELVIR/RITONAVIR (PAXLOVID)TABLET: 3.0000 | ORAL_TABLET | Freq: Two times a day (BID) | ORAL | 0 refills | Status: AC

## 2023-05-06 NOTE — Addendum Note (Signed)
Addended by: Jonah Blue B on: 05/06/2023 09:55 PM   Modules accepted: Orders

## 2023-05-06 NOTE — Telephone Encounter (Signed)
  Chief Complaint: COVID positive Symptoms: runny nose, sore throat, cough with congestion, body aches, fever,  Frequency: Tuesday  Pertinent Negatives: Patient denies SOB Disposition: [] ED /[] Urgent Care (no appt availability in office) / [] Appointment(In office/virtual)/ []  [facility]/ [] Home Care/ [x] Refused Recommended Disposition /[] [facility]/ [x]  Follow-up with PCP Additional Notes: pt states she has been taking sudafed OTC but not helping sx. Tested positive last night for COVID. No appts with practice, offered to schedule VUC but pt declined. She doesn't want to do an appt when she has had a positive test result. Pt is asking if PCP can send in Paxlovid. Advised I would send message to provider.  Summary: Paxlovid requested - positive covid test   Pt requesting paxlovid, pt tested positive for covid last night. Symptoms started Tuesday. Pt states symptoms started w/ runny nose & sneezing. Has had sore throat and cough, nasal congestion, body aches, and fever - pt's thermometer is not working  and unable to get exact reading. Pt inquiring if appointment is needed or if the paxlovid can be prescribed.  Please assist pt further         Reason for Disposition  [1] HIGH RISK patient (e.g., weak immune system, age > 64 years, obesity with BMI 30 or higher, pregnant, chronic lung disease or other chronic medical condition) AND [2] COVID symptoms (e.g., cough, fever)  (Exceptions: Already seen by PCP and no new or worsening symptoms.)  Answer Assessment - Initial Assessment Questions 1. COVID-19 DIAGNOSIS: "How do you know that you have COVID?" (e.g., positive lab test or self-test, diagnosed by doctor or NP/PA, symptoms after exposure).     Home test positive  3. ONSET: "When did the COVID-19 symptoms start?"      Tuesday  5. COUGH: "Do you have a cough?" If Yes, ask: "How bad is the cough?"       yes 6. FEVER: "Do you have a fever?" If Yes, ask: "What is your  temperature, how was it measured, and when did it start?"     Yes thermometer is broken  7. RESPIRATORY STATUS: "Describe your breathing?" (e.g., normal; shortness of breath, wheezing, unable to speak)      no 8. BETTER-SAME-WORSE: "Are you getting better, staying the same or getting worse compared to yesterday?"  If getting worse, ask, "In what way?"     worse 9. OTHER SYMPTOMS: "Do you have any other symptoms?"  (e.g., chills, fatigue, headache, loss of smell or taste, muscle pain, sore throat)     Sneezing runny nose, cough with congestion, body aches, fever 10. HIGH RISK DISEASE: "Do you have any chronic medical problems?" (e.g., asthma, heart or lung disease, weak immune system, obesity, etc.)       No, DM  11. VACCINE: "Have you had the COVID-19 vaccine?" If Yes, ask: "Which one, how many shots, when did you get it?"       yes  Protocols used: Coronavirus (COVID-19) Diagnosed or Suspected-A-AH

## 2023-05-06 NOTE — Telephone Encounter (Signed)
Prescription sent to her pharmacy for Paxlovid. Let patient know that she should hold off on taking atorvastatin and fenofibrate while she is on the Paxlovid.  Continue to hold off on taking the atorvastatin and fenofibrate for 1 week after completion of the Paxlovid.  The Paxlovid can cause some change in taste and diarrhea. I sent her a Mychart message with this same info.  If she has not looked at the message, please call her and give her this information.

## 2023-05-07 ENCOUNTER — Other Ambulatory Visit: Payer: Self-pay

## 2023-05-07 NOTE — Telephone Encounter (Signed)
Spoke with patient and advised per PCP"Prescription sent to her pharmacy for Paxlovid. Let patient know that she should hold off on taking atorvastatin and fenofibrate while she is on the Paxlovid.  Continue to hold off on taking the atorvastatin and fenofibrate for 1 week after completion of the Paxlovid.  The Paxlovid can cause some change in taste and diarrhea. I sent her a Mychart message with this same info.  If she has not looked at the message, please call her and give her this information." Patient advise that medication was sent to [facility]

## 2023-05-10 ENCOUNTER — Other Ambulatory Visit: Payer: Self-pay

## 2023-05-11 ENCOUNTER — Other Ambulatory Visit: Payer: Self-pay

## 2023-05-13 ENCOUNTER — Other Ambulatory Visit: Payer: Self-pay

## 2023-05-17 ENCOUNTER — Other Ambulatory Visit: Payer: Self-pay

## 2023-05-22 ENCOUNTER — Ambulatory Visit (HOSPITAL_COMMUNITY)
Admission: EM | Admit: 2023-05-22 | Discharge: 2023-05-22 | Disposition: A | Discharge status: Home / Self Care | Payer: Medicaid Other | Attending: Physician Assistant | Admitting: Physician Assistant

## 2023-05-22 ENCOUNTER — Encounter (HOSPITAL_COMMUNITY): Payer: Self-pay

## 2023-05-22 DIAGNOSIS — R197 Diarrhea, unspecified: Secondary | ICD-10-CM | POA: Diagnosis not present

## 2023-05-22 LAB — CBC WITH DIFFERENTIAL/PLATELET
Abs Immature Granulocytes: 0.02 10*3/uL (ref 0.00–0.07)
Basophils Absolute: 0.1 10*3/uL (ref 0.0–0.1)
Basophils Relative: 1 %
Eosinophils Absolute: 0.3 10*3/uL (ref 0.0–0.5)
Eosinophils Relative: 4 %
HCT: 40.1 % (ref 36.0–46.0)
Hemoglobin: 13.4 g/dL (ref 12.0–15.0)
Immature Granulocytes: 0 %
Lymphocytes Relative: 32 %
Lymphs Abs: 2.9 10*3/uL (ref 0.7–4.0)
MCH: 27.7 pg (ref 26.0–34.0)
MCHC: 33.4 g/dL (ref 30.0–36.0)
MCV: 82.9 fL (ref 80.0–100.0)
Monocytes Absolute: 0.6 10*3/uL (ref 0.1–1.0)
Monocytes Relative: 7 %
Neutro Abs: 5.3 10*3/uL (ref 1.7–7.7)
Neutrophils Relative %: 56 %
Platelets: 345 10*3/uL (ref 150–400)
RBC: 4.84 MIL/uL (ref 3.87–5.11)
RDW: 15.1 % (ref 11.5–15.5)
WBC: 9.3 10*3/uL (ref 4.0–10.5)
nRBC: 0 % (ref 0.0–0.2)

## 2023-05-22 LAB — COMPREHENSIVE METABOLIC PANEL
ALT: 42 U/L (ref 0–44)
AST: 29 U/L (ref 15–41)
Albumin: 4 g/dL (ref 3.5–5.0)
Alkaline Phosphatase: 53 U/L (ref 38–126)
Anion gap: 12 (ref 5–15)
BUN: 11 mg/dL (ref 6–20)
CO2: 19 mmol/L — ABNORMAL LOW (ref 22–32)
Calcium: 9.1 mg/dL (ref 8.9–10.3)
Chloride: 108 mmol/L (ref 98–111)
Creatinine, Ser: 0.62 mg/dL (ref 0.44–1.00)
GFR, Estimated: >60 mL/min (ref >60)
Glucose, Bld: 76 mg/dL (ref 70–99)
Potassium: 3.3 mmol/L — ABNORMAL LOW (ref 3.5–5.1)
Sodium: 139 mmol/L (ref 135–145)
Total Bilirubin: 1.1 mg/dL (ref 0.0–1.2)
Total Protein: 6.7 g/dL (ref 6.5–8.1)

## 2023-05-22 MED ORDER — DIPHENOXYLATE-ATROPINE 2.5-0.025 MG PO TABS: 1.0000 | ORAL_TABLET | Freq: Four times a day (QID) | ORAL | 0 refills | Status: DC | PRN

## 2023-05-22 MED ORDER — SODIUM CHLORIDE 0.9 % IV BOLUS
1000.0000 mL | Freq: Once | INTRAVENOUS | Status: AC
  Administered 2023-05-22: 1000 mL via INTRAVENOUS

## 2023-05-22 MED ORDER — ONDANSETRON 4 MG PO TBDP: 4.0000 mg | ORAL_TABLET | Freq: Three times a day (TID) | ORAL | 0 refills | Status: AC | PRN

## 2023-05-22 NOTE — ED Provider Notes (Signed)
 [facility]    CSN: [id number] Arrival date & time: 05/22/23  1059      History   Chief Complaint Chief Complaint  Patient presents with   Diarrhea   Generalized Body Aches    HPI Teresa Camacho is a 58 y.o. female.   Pt complains of diarrhea for the past week.  Pt reports multiple episodes of diarrhea.  Pt is toleraing fluids complains of feeling fatigued.  Patient had COVID recently and was treated with Paxlovid .  Patient denies any bloody bowel movements she denies any black bowel movements.  Patient has not vomited any blood.  Patient denies fever or chills  The history is provided by the patient. No language interpreter was used.  Diarrhea Quality:  Unable to specify Timing:  Constant Progression:  Unchanged Relieved by:  Nothing   Past Medical History:  Diagnosis Date   Anemia    Blood transfusion without reported diagnosis    Fibroids, submucosal 05/03/2013   GERD (gastroesophageal reflux disease)    Hyperlipidemia    Hypertension    PCOS (polycystic ovarian syndrome)    PONV (postoperative nausea and vomiting)     super tight airway as per pt   Sleep apnea    Type II diabetes mellitus (HCC)     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   History of allergy 12/26/2019   OSA on CPAP 12/26/2019   Class 3 severe obesity due to excess calories with serious comorbidity and body mass index (BMI) of 40.0 to 44.9 in adult (HCC) 12/26/2019   Hyperlipidemia associated with type 2 diabetes mellitus (HCC) 12/26/2019   Essential hypertension 12/26/2019   Family history of colonic polyps 12/26/2019   Gastroesophageal reflux disease 12/26/2019   Type 2 diabetes mellitus with hyperglycemia, with long-term current use of insulin  (HCC) 11/30/2019    Iron deficiency anemia due to chronic blood loss 06/28/2013   S/P hysterectomy 06/13/2013    Past Surgical History:  Procedure Laterality Date   ABDOMINAL HYSTERECTOMY N/A 06/13/2013   Procedure: HYSTERECTOMY ABDOMINAL and removal of vaginal skin tag;  Surgeon: Alm JAYSON Cook, MD;  Location: [facility];  Service: Gynecology;  Laterality: N/A;   APPENDECTOMY     BALLOON DILATION N/A 08/27/2020   Procedure: BALLOON DILATION;  Surgeon: Abran Norleen SAILOR, MD;  Location: [facility];  Service: Endoscopy;  Laterality: N/A;   CESAREAN SECTION     3x   CHOLECYSTECTOMY     CHOLECYSTECTOMY  1991   COLONOSCOPY WITH PROPOFOL  N/A 08/27/2020   Procedure: COLONOSCOPY WITH PROPOFOL ;  Surgeon: Abran Norleen SAILOR, MD;  Location: [facility];  Service: Endoscopy;  Laterality: N/A;   ESOPHAGOGASTRODUODENOSCOPY (EGD) WITH PROPOFOL  N/A 08/27/2020   Procedure: ESOPHAGOGASTRODUODENOSCOPY (EGD) WITH PROPOFOL ;  Surgeon: Abran Norleen SAILOR, MD;  Location: [facility];  Service: Endoscopy;  Laterality: N/A;   POLYPECTOMY  08/27/2020   Procedure: POLYPECTOMY;  Surgeon: Abran Norleen SAILOR, MD;  Location: [facility];  Service: Endoscopy;;    OB History     Gravida  4   Para  3   Term  3   Preterm      AB      Living  5      SAB      IAB  Ectopic      Multiple      Live Births               Home Medications    Prior to Admission medications   Medication Sig Start Date End Date Taking? Authorizing Provider  aspirin  EC 81 MG tablet Take 1 tablet (81 mg total) by mouth daily. Swallow whole. Patient taking differently: Take 81 mg by mouth in the morning. Swallow whole. 12/26/19  Yes Vicci Barnie NOVAK, MD  atorvastatin  (LIPITOR) 80 MG tablet Take 1 tablet (80 mg total) by mouth daily. 03/01/23  Yes Vicci Barnie NOVAK, MD  Cholecalciferol (VITAMIN D-3) 125 MCG (5000 UT) TABS Take 5,000 Units by mouth in the morning.   Yes [provider]  dapagliflozin  propanediol (FARXIGA ) 5 MG TABS tablet Take 1  tablet (5 mg total) by mouth daily. 02/12/23  Yes Trixie File, MD  diphenhydrAMINE  (BENADRYL ) 25 mg capsule Take 50 mg by mouth at bedtime.   Yes [provider]  fenofibrate  (TRICOR ) 48 MG tablet TAKE 1 TABLET BY MOUTH DAILY 03/31/23  Yes Vicci Barnie NOVAK, MD  gabapentin  (NEURONTIN ) 300 MG capsule Take 3 capsules (900 mg total) by mouth at bedtime. 02/11/23  Yes Newlin, Enobong, MD  insulin  glargine (LANTUS ) 100 unit/mL SOPN Inject 110-120 Units into the skin daily. 02/22/23  Yes Trixie File, MD  lisinopril  (ZESTRIL ) 10 MG tablet Take 1 tablet (10 mg total) by mouth daily. 03/26/23  Yes Vicci Barnie NOVAK, MD  Melatonin 10 MG SUBL Place 20 mg under the tongue at bedtime.   Yes [provider]  metFORMIN  (GLUCOPHAGE -XR) 500 MG 24 hr tablet Take 4 tablets (2,000 mg total) by mouth daily. 02/12/23  Yes Trixie File, MD  metoprolol  tartrate (LOPRESSOR ) 25 MG tablet TAKE 1 TABLET BY MOUTH 2 TIMES A DAY 04/07/23  Yes Vicci Barnie NOVAK, MD  pantoprazole  (PROTONIX ) 40 MG tablet Take 1 tablet (40 mg total) by mouth 2 (two) times daily before a meal. Office visit for further refills 03/01/23  Yes Vicci Barnie NOVAK, MD  Semaglutide , 2 MG/DOSE, (OZEMPIC , 2 MG/DOSE,) 8 MG/3ML SOPN Inject 2 mg into the skin once a week. 02/12/23  Yes Trixie File, MD  traZODone  (DESYREL ) 100 MG tablet Take 1 tablet (100 mg total) by mouth at bedtime. 03/01/23  Yes Vicci Barnie NOVAK, MD  Accu-Chek Softclix Lancets lancets use one lancet to check blood glucose three times a day 02/22/23   Vicci Barnie NOVAK, MD  albuterol  (VENTOLIN  HFA) 108 (90 Base) MCG/ACT inhaler Inhale 2 puffs into the lungs every 6 (six) hours as needed for wheezing or shortness of breath. 12/12/21   Vicci Barnie NOVAK, MD  blood glucose meter kit and supplies KIT Dispense based on patient and insurance preference. Use up to four times daily as directed. (FOR ICD-9 250.00, 250.01). 12/02/19   Jillian Buttery, MD  Blood  Glucose Monitoring Suppl (ACCU-CHEK GUIDE) w/Device KIT use to check blood glucose three times a day 05/12/22   Vicci Barnie NOVAK, MD  Continuous Glucose Receiver (FREESTYLE LIBRE 3 READER) DEVI Use as directed to check blood glucose. 08/14/22   Vicci Barnie NOVAK, MD  Continuous Glucose Sensor (FREESTYLE LIBRE 3 SENSOR) MISC Change every 2 weeks 08/14/22   Vicci Barnie NOVAK, MD  desloratadine (CLARINEX) 5 MG tablet Take 5 mg by mouth at bedtime.    [provider]  glucose blood (ACCU-CHEK GUIDE) test strip use one test strip to check blood glucose three times a day 12/15/22  Vicci Barnie NOVAK, MD  Insulin  Pen Needle (TECHLITE PLUS PEN NEEDLES) 32G X 4 MM MISC Use in the morning and at bedtime 02/24/23   Trixie File, MD  lisinopril  (ZESTRIL ) 10 MG tablet Take 1 tablet (10 mg total) by mouth daily. 03/26/23   Vicci Barnie NOVAK, MD  loratadine  (CLARITIN ) 10 MG tablet Take 1 tablet (10 mg total) by mouth daily. 12/26/19   Vicci Barnie NOVAK, MD  meclizine  (ANTIVERT ) 12.5 MG tablet Take 1 tablet (12.5 mg total) by mouth 2 (two) times daily as needed for dizziness. 04/02/22   Vicci Barnie NOVAK, MD  Multiple Vitamins-Minerals (MULTIVITAMIN WITH MINERALS) tablet Take 1 tablet by mouth daily.    [provider]    Family History Family History  Problem Relation Age of Onset   Depression Mother    Diabetes Mother    Arthritis Mother    Heart disease Mother    Learning disabilities Mother    Mental illness Mother    Liver cancer Mother        metastatic from colon??   Arthritis Father    Prostate cancer Father        met to esophagus   Colon polyps Sister    Cancer Maternal Aunt        x 2 Aunts   Breast cancer Maternal Grandmother     Social History Social History   Tobacco Use   Smoking status: Never   Smokeless tobacco: Never  Vaping Use   Vaping status: Never Used  Substance Use Topics   Alcohol use: No   Drug use: No     Allergies   Wound dressing  adhesive, Codeine, and Latex   Review of Systems Review of Systems  Gastrointestinal:  Positive for diarrhea.  All other systems reviewed and are negative.    Physical Exam Triage Vital Signs ED Triage Vitals  Encounter Vitals Group     BP 05/22/23 1245 (!) 86/52     Systolic BP Percentile --      Diastolic BP Percentile --      Pulse Rate 05/22/23 1245 76     Resp 05/22/23 1245 16     Temp 05/22/23 1245 97.9 F (36.6 C)     Temp Source 05/22/23 1245 Oral     SpO2 05/22/23 1245 96 %     Weight 05/22/23 1245 215 lb (97.5 kg)     Height 05/22/23 1245 5' 1 (1.549 m)     Head Circumference --      Peak Flow --      Pain Score 05/22/23 1242 4     Pain Loc --      Pain Education --      Exclude from Growth Chart --    No data found.  Updated Vital Signs BP 91/60 (BP Location: Left Arm)   Pulse 76   Temp 97.9 F (36.6 C) (Oral)   Resp 16   Ht 5' 1 (1.549 m)   Wt 97.5 kg   LMP 03/13/2013   SpO2 96%   BMI 40.62 kg/m   Visual Acuity Right Eye Distance:   Left Eye Distance:   Bilateral Distance:    Right Eye Near:   Left Eye Near:    Bilateral Near:     Physical Exam Vitals reviewed.  Constitutional:      Appearance: Normal appearance.  Cardiovascular:     Rate and Rhythm: Normal rate.     Pulses: Normal pulses.  Pulmonary:  Effort: Pulmonary effort is normal.  Abdominal:     General: Abdomen is flat.  Musculoskeletal:        General: Normal range of motion.  Skin:    General: Skin is warm.  Neurological:     General: No focal deficit present.     Mental Status: She is alert.  Psychiatric:        Mood and Affect: Mood normal.      UC Treatments / Results  Labs (all labs ordered are listed, but only abnormal results are displayed) Labs Reviewed  CBC WITH DIFFERENTIAL/PLATELET  COMPREHENSIVE METABOLIC PANEL    EKG   Radiology No results found.  Procedures Procedures (including critical care time)  Medications Ordered in  UC Medications  sodium chloride  0.9 % bolus 1,000 mL (0 mLs Intravenous Stopped 05/22/23 1401)    Initial Impression / Assessment and Plan / UC Course  I have reviewed the triage vital signs and the nursing notes.  Pertinent labs & imaging results that were available during my care of the patient were reviewed by me and considered in my medical decision making (see chart for details).     Pt's blood pressure is low.  Pt given IV fluids x 1 liter.  Patient reports feeling some better patient is able to tolerate oral fluids.  Thana evaluation obtained patient has a normal white blood cell count. Final Clinical Impressions(s) / UC Diagnoses   Final diagnoses:  Diarrhea, unspecified type     Discharge Instructions      Return if any problems.      ED Prescriptions     Medication Sig Dispense Auth. Provider   ondansetron  (ZOFRAN -ODT) 4 MG disintegrating tablet Take 1 tablet (4 mg total) by mouth every 8 (eight) hours as needed for nausea or vomiting. 10 tablet Akashdeep Chuba K, PA-C   diphenoxylate -atropine  (LOMOTIL ) 2.5-0.025 MG tablet Take 1 tablet by mouth 4 (four) times daily as needed for diarrhea or loose stools. 12 tablet Cordarro Spinnato K, PA-C      PDMP not reviewed this encounter. An After Visit Summary was printed and given to the patient.       Flint Sonny POUR, PA-C 05/22/23 [id number]

## 2023-05-22 NOTE — ED Triage Notes (Signed)
 Diarrhea, fatigue, body aches x 7 days. No nausea or emesis. No recent dietary or medications changes. Patient had Covid 2 weeks ago and took Paxlovid  that she finished 12/25-26/24. No one else sick or having similar symptoms.   Patient has tried Pepto bismol, imodium with no relief.

## 2023-05-22 NOTE — Discharge Instructions (Addendum)
 Return if any problems.

## 2023-05-24 ENCOUNTER — Other Ambulatory Visit: Payer: Self-pay | Admitting: Family Medicine

## 2023-05-24 ENCOUNTER — Other Ambulatory Visit: Payer: Self-pay | Admitting: Internal Medicine

## 2023-05-24 ENCOUNTER — Ambulatory Visit (INDEPENDENT_AMBULATORY_CARE_PROVIDER_SITE_OTHER): Payer: Medicaid Other | Admitting: Internal Medicine

## 2023-05-24 ENCOUNTER — Encounter: Payer: Self-pay | Admitting: Internal Medicine

## 2023-05-24 ENCOUNTER — Other Ambulatory Visit: Payer: Self-pay

## 2023-05-24 ENCOUNTER — Telehealth: Payer: Self-pay

## 2023-05-24 VITALS — BP 128/70 | HR 74 | Ht 61.0 in | Wt 214.2 lb

## 2023-05-24 DIAGNOSIS — Z7984 Long term (current) use of oral hypoglycemic drugs: Secondary | ICD-10-CM | POA: Diagnosis not present

## 2023-05-24 DIAGNOSIS — E1142 Type 2 diabetes mellitus with diabetic polyneuropathy: Secondary | ICD-10-CM

## 2023-05-24 DIAGNOSIS — Z7985 Long-term (current) use of injectable non-insulin antidiabetic drugs: Secondary | ICD-10-CM

## 2023-05-24 DIAGNOSIS — E785 Hyperlipidemia, unspecified: Secondary | ICD-10-CM | POA: Diagnosis not present

## 2023-05-24 DIAGNOSIS — E1169 Type 2 diabetes mellitus with other specified complication: Secondary | ICD-10-CM

## 2023-05-24 DIAGNOSIS — Z794 Long term (current) use of insulin: Secondary | ICD-10-CM

## 2023-05-24 LAB — POCT GLYCOSYLATED HEMOGLOBIN (HGB A1C): Hemoglobin A1C: 6.1 % — AB (ref 4.0–5.6)

## 2023-05-24 MED ORDER — DEXCOM G7 SENSOR MISC: 3.0000 | 4 refills | Status: DC

## 2023-05-24 NOTE — Telephone Encounter (Signed)
 Patient came in requesting refills on gabapentin and for all refills to be sent to [facility] on [address].

## 2023-05-24 NOTE — Patient Instructions (Addendum)
 Please continue: - Farxiga  5 mg before b'fast - Ozempic  2 mg weekly - Lantus  120 units daily in am (can try to split in 2 doses)  Try to reduce: - Metformin  ER 500 mg 2x a day. If this helps with diarrhea, stop Metformin .  Try to restart the sensor - Dexcom G7.  Please return in 3-4 months.

## 2023-05-24 NOTE — Progress Notes (Signed)
 Patient ID: Teresa Camacho, female   DOB: 1965/08/15, 58 y.o.   MRN: [id number]  HPI: Teresa Camacho is a 58 y.o.-year-old female, returning for follow-up for DM2, dx in 2021, insulin -dependent soon post dx., uncontrolled, with complications (PN). Pt. previously saw Dr. Kassie, last visit with me 3.5 months ago.   She is here with her daughter, who is also my patient.  Interim history: No increased urination, blurry vision, nausea, chest pain. She had recent COVID-19 infection and was treated with Paxlovid . She has diarrhea immediately after she eats.  She was actually in the emergency room with severe diarrhea 05/22/2023. 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 She tried the [place] CGM >> skin irritation.  Reviewed HbA1c: Lab Results  Component Value Date   HGBA1C 5.6 02/12/2023   HGBA1C 6.4 (A) 10/09/2022   HGBA1C 6.2 (A) 06/10/2022   HGBA1C 6.6 (A) 02/06/2022   HGBA1C 7.3 (A) 10/06/2021   HGBA1C 7.3 (A) 07/30/2021   HGBA1C 8.9 (A) 05/28/2021   HGBA1C 8.0 (A) 03/26/2021   HGBA1C 8.2 (A) 01/24/2021   HGBA1C 8.8 (A) 11/05/2020   Pt is on a regimen of: - Metformin  ER 1000 mg 2x a day, with meals - Farxiga  5 mg daily in am - ran out >> restarted - >> Lantus  120 >> 140  >> 100 units daily (thighs) >> 120 units daily - Ozempic  0.25 >> 0.5 >> 1 mg weekly - tried a higher dose - diarrhea, belching (Mylanta did not help) >> 2 mg weekly -increase 09/2022 She was on Lantus  before >> sugars high later in the day.  Pt checks her sugars 2x a day and they are: - am: 83-158 >> 85-139 >> 90-low 100 >> 75, 81-121, 130, 169 - 2h after b'fast: n/c - before lunch: 55 >> n/c >> 88, 99 >> n/c - 2h after lunch: n/c >> 153 >> n/c - before dinner: n/c >> 70-105 >> 111-139 >> n/c >> 79, 97-137 - 2h after dinner: 118-245 >> 144-202 >> 130-150 >> 131-186, 180, 230, 258 - bedtime: n/c - nighttime: n/c Lowest sugar was 56 >> 82 >> 85 >> 90; she has hypoglycemia awareness at 70.  Highest sugar was  266 >> 245 >> 202 >> 150.  Glucometer: True Metrix  - no CKD, last BUN/creatinine:  Lab Results  Component Value Date   BUN 11 05/22/2023   BUN 13 03/01/2023   CREATININE 0.62 05/22/2023   CREATININE 0.76 03/01/2023   Lab Results  Component Value Date   MICRALBCREAT 30 (H) 03/01/2023   MICRALBCREAT 25 03/16/2022   MICRALBCREAT 14 03/13/2021   MICRALBCREAT 231 (H) 12/26/2019  She is on lisinopril  10 mg daily.  - +HL; last set of lipids: Lab Results  Component Value Date   CHOL 86 (L) 03/01/2023   HDL 37 (L) 03/01/2023   LDLCALC 29 03/01/2023   LDLDIRECT 95.5 12/02/2019   TRIG 103 03/01/2023   CHOLHDL 2.3 03/01/2023  She is on Lipitor 80 mg daily and Tricor  48 mg daily.  - last eye exam was 09/03/2022. No DR.   - + numbness and tingling in her feet.  She is on Neurontin  300 mg x 2 at bedtime.  Last foot exam 02/12/2023  She has a history of PCOS, HTN, OSA, GERD, fibroids, s/p TAH.  ROS: + see HPI  Past Medical History:  Diagnosis Date   Anemia    Blood transfusion without reported diagnosis    Fibroids, submucosal 05/03/2013  GERD (gastroesophageal reflux disease)    Hyperlipidemia    Hypertension    PCOS (polycystic ovarian syndrome)    PONV (postoperative nausea and vomiting)     super tight airway as per pt   Sleep apnea    Type II diabetes mellitus (HCC)    Past Surgical History:  Procedure Laterality Date   ABDOMINAL HYSTERECTOMY N/A 06/13/2013   Procedure: HYSTERECTOMY ABDOMINAL and removal of vaginal skin tag;  Surgeon: Alm JAYSON Cook, MD;  Location: [facility];  Service: Gynecology;  Laterality: N/A;   APPENDECTOMY     BALLOON DILATION N/A 08/27/2020   Procedure: BALLOON DILATION;  Surgeon: Abran Norleen SAILOR, MD;  Location: [facility];  Service: Endoscopy;  Laterality: N/A;   CESAREAN SECTION     3x   CHOLECYSTECTOMY     CHOLECYSTECTOMY  1991   COLONOSCOPY WITH PROPOFOL  N/A 08/27/2020   Procedure: COLONOSCOPY WITH PROPOFOL ;  Surgeon: Abran Norleen SAILOR, MD;   Location: [facility];  Service: Endoscopy;  Laterality: N/A;   ESOPHAGOGASTRODUODENOSCOPY (EGD) WITH PROPOFOL  N/A 08/27/2020   Procedure: ESOPHAGOGASTRODUODENOSCOPY (EGD) WITH PROPOFOL ;  Surgeon: Abran Norleen SAILOR, MD;  Location: [facility];  Service: Endoscopy;  Laterality: N/A;   POLYPECTOMY  08/27/2020   Procedure: POLYPECTOMY;  Surgeon: Abran Norleen SAILOR, MD;  Location: [facility];  Service: Endoscopy;;   Social History   Socioeconomic History   Marital status: Divorced    Spouse name: Not on file   Number of children: 5   Years of education: Not on file   Highest education level: Not on file  Occupational History   Not on file  Tobacco Use   Smoking status: Never   Smokeless tobacco: Never  Vaping Use   Vaping status: Never Used  Substance and Sexual Activity   Alcohol use: No   Drug use: No   Sexual activity: Not Currently  Other Topics Concern   Not on file  Social History Narrative   Not on file   Social Drivers of Health   Financial Resource Strain: Medium Risk (03/01/2023)   Overall Financial Resource Strain (CARDIA)    Difficulty of Paying Living Expenses: Somewhat hard  Food Insecurity: Food Insecurity Present (03/01/2023)   Hunger Vital Sign    Worried About Running Out of Food in the Last Year: Sometimes true    Ran Out of Food in the Last Year: Sometimes true  Transportation Needs: No Transportation Needs (03/01/2023)   PRAPARE - Administrator, Civil Service (Medical): No    Lack of Transportation (Non-Medical): No  Physical Activity: Sufficiently Active (03/01/2023)   Exercise Vital Sign    Days of Exercise per Week: 4 days    Minutes of Exercise per Session: 40 min  Stress: No Stress Concern Present (03/01/2023)   [employer] of Occupational Health - Occupational Stress Questionnaire    Feeling of Stress : Not at all  Social Connections: Moderately Integrated (03/01/2023)   Social Connection and Isolation Panel [NHANES]    Frequency  of Communication with Friends and Family: Three times a week    Frequency of Social Gatherings with Friends and Family: Twice a week    Attends Religious Services: More than 4 times per year    Active Member of [employer] or Organizations: Yes    Attends Banker Meetings: More than 4 times per year    Marital Status: Divorced  Intimate Partner Violence: Not At Risk (03/01/2023)   Humiliation, Afraid, Rape, and Kick questionnaire  Fear of Current or Ex-Partner: No    Emotionally Abused: No    Physically Abused: No    Sexually Abused: No   Current Outpatient Medications on File Prior to Visit  Medication Sig Dispense Refill   Accu-Chek Softclix Lancets lancets use one lancet to check blood glucose three times a day 100 each 0   albuterol  (VENTOLIN  HFA) 108 (90 Base) MCG/ACT inhaler Inhale 2 puffs into the lungs every 6 (six) hours as needed for wheezing or shortness of breath. 8 g 0   aspirin  EC 81 MG tablet Take 1 tablet (81 mg total) by mouth daily. Swallow whole. (Patient taking differently: Take 81 mg by mouth in the morning. Swallow whole.) 100 tablet 2   atorvastatin  (LIPITOR) 80 MG tablet Take 1 tablet (80 mg total) by mouth daily. 90 tablet 1   blood glucose meter kit and supplies KIT Dispense based on patient and insurance preference. Use up to four times daily as directed. (FOR ICD-9 250.00, 250.01). 1 each 0   Blood Glucose Monitoring Suppl (ACCU-CHEK GUIDE) w/Device KIT use to check blood glucose three times a day 1 kit 0   Cholecalciferol (VITAMIN D-3) 125 MCG (5000 UT) TABS Take 5,000 Units by mouth in the morning.     Continuous Glucose Receiver (FREESTYLE LIBRE 3 READER) DEVI Use as directed to check blood glucose. 1 each 0   Continuous Glucose Sensor (FREESTYLE LIBRE 3 SENSOR) MISC Change every 2 weeks 2 each 6   dapagliflozin  propanediol (FARXIGA ) 5 MG TABS tablet Take 1 tablet (5 mg total) by mouth daily. 90 tablet 3   desloratadine (CLARINEX) 5 MG tablet Take 5 mg  by mouth at bedtime.     diphenhydrAMINE  (BENADRYL ) 25 mg capsule Take 50 mg by mouth at bedtime.     diphenoxylate -atropine  (LOMOTIL ) 2.5-0.025 MG tablet Take 1 tablet by mouth 4 (four) times daily as needed for diarrhea or loose stools. 12 tablet 0   fenofibrate  (TRICOR ) 48 MG tablet TAKE 1 TABLET BY MOUTH DAILY 90 tablet 1   gabapentin  (NEURONTIN ) 300 MG capsule Take 3 capsules (900 mg total) by mouth at bedtime. 90 capsule 2   glucose blood (ACCU-CHEK GUIDE) test strip use one test strip to check blood glucose three times a day 100 each 2   insulin  glargine (LANTUS ) 100 unit/mL SOPN Inject 110-120 Units into the skin daily. 90 mL 3   Insulin  Pen Needle (TECHLITE PLUS PEN NEEDLES) 32G X 4 MM MISC Use in the morning and at bedtime 200 each 1   lisinopril  (ZESTRIL ) 10 MG tablet Take 1 tablet (10 mg total) by mouth daily. 90 tablet 3   lisinopril  (ZESTRIL ) 10 MG tablet Take 1 tablet (10 mg total) by mouth daily. 90 tablet 1   loratadine  (CLARITIN ) 10 MG tablet Take 1 tablet (10 mg total) by mouth daily. 30 tablet 6   meclizine  (ANTIVERT ) 12.5 MG tablet Take 1 tablet (12.5 mg total) by mouth 2 (two) times daily as needed for dizziness. 60 tablet 3   Melatonin 10 MG SUBL Place 20 mg under the tongue at bedtime.     metFORMIN  (GLUCOPHAGE -XR) 500 MG 24 hr tablet Take 4 tablets (2,000 mg total) by mouth daily. 360 tablet 3   metoprolol  tartrate (LOPRESSOR ) 25 MG tablet TAKE 1 TABLET BY MOUTH 2 TIMES A DAY 180 tablet 1   Multiple Vitamins-Minerals (MULTIVITAMIN WITH MINERALS) tablet Take 1 tablet by mouth daily.     ondansetron  (ZOFRAN -ODT) 4 MG disintegrating tablet Take 1  tablet (4 mg total) by mouth every 8 (eight) hours as needed for nausea or vomiting. 10 tablet 0   pantoprazole  (PROTONIX ) 40 MG tablet Take 1 tablet (40 mg total) by mouth 2 (two) times daily before a meal. Office visit for further refills 180 tablet 2   Semaglutide , 2 MG/DOSE, (OZEMPIC , 2 MG/DOSE,) 8 MG/3ML SOPN Inject 2 mg into the  skin once a week. 9 mL 3   traZODone  (DESYREL ) 100 MG tablet Take 1 tablet (100 mg total) by mouth at bedtime. 90 tablet 1   No current facility-administered medications on file prior to visit.   Allergies  Allergen Reactions   Wound Dressing Adhesive Other (See Comments)    Redness/irritation   Codeine Nausea And Vomiting    Pt is able to take percocet & vicodin   Latex Rash    irritation   Family History  Problem Relation Age of Onset   Depression Mother    Diabetes Mother    Arthritis Mother    Heart disease Mother    Learning disabilities Mother    Mental illness Mother    Liver cancer Mother        metastatic from colon??   Arthritis Father    Prostate cancer Father        met to esophagus   Colon polyps Sister    Cancer Maternal Aunt        x 2 Aunts   Breast cancer Maternal Grandmother    PE: BP 128/70   Pulse 74   Ht 5' 1 (1.549 m)   Wt 214 lb 3.2 oz (97.2 kg)   LMP 03/13/2013   SpO2 95%   BMI 40.47 kg/m  Wt Readings from Last 3 Encounters:  05/24/23 214 lb 3.2 oz (97.2 kg)  05/22/23 215 lb (97.5 kg)  03/01/23 212 lb (96.2 kg)   Constitutional: overweight, in NAD Eyes:  EOMI, no exophthalmos ENT: no neck masses, no cervical lymphadenopathy Cardiovascular: RRR, No MRG Respiratory: CTA B Musculoskeletal: no deformities Skin:no rashes Neurological: no tremor with outstretched hands  ASSESSMENT: 1. DM2, insulin -dependent, uncontrolled, with complications - PN  2. HL  PLAN:  1. Patient with longstanding, uncontrolled, type 2 diabetes, on oral antidiabetic regimen with metformin  and SGLT2 inhibitor along with weekly GLP-1 receptor agonist and long-acting insulin .  At last visit sugars remained at goal, without hyperglycemic or hypoglycemic excursions.  However, upon questioning, she was using a higher dose of Lantus  than recommended, 140 units in the morning.  She did try to reduce the dose and also to split the dose with subsequently increased blood  sugars.  Upon questioning, she was using her abdomen for injections and, despite the fact that she was rotating the sites, I suspected that she had scar tissue at the injection site.  I advised her to reduce the dose of Lantus  and start injecting in the thighs. -At last visit she was on Ozempic , tolerated well but she was inquiring about Mounjaro .  However, she had an exaggerated gastrocolic reflex and therefore I did not recommend it.  I did recommend to possibly see GI for this.  At this visit, she tells me that her diarrhea is worse and he has a radio producer.  She tried to see PCP but she could not get an appointment so had to go to urgent care.  We discussed that we need to decrease and possibly even stop the metformin  ER due to the diarrhea but I again suggested to see PCP and  possibly have stool studies and further investigation or maybe even a referral to GI.  I am hoping that we do not need to start a GLP-1 receptor agonist. -She feels that her blood sugars are a little worse (she did have slightly higher numbers during the holidays and during her COVID-19 episode especially after dinner) so she is using a higher dose of Lantus  than recommended.  We discussed about possibly splitting the dose of Lantus  into 2 injections at the same time to avoid pain at the injection site. -She was able to start the freestyle libre CGM after last visit but this caused skin irritation.  At today's visit I recommended to try the Dexcom G7.  She agrees with this. - I suggested to:  Patient Instructions  Please continue: - Farxiga  5 mg before b'fast - Ozempic  2 mg weekly - Lantus  120 units daily in am (can try to split in 2 doses)  Try to reduce: - Metformin  ER 500 mg 2x a day. If this helps with diarrhea, stop Metformin .  Try to restart the sensor - Dexcom G7.  Please return in 3-4 months.   - we checked her HbA1c: 6.1% (higher, but still at goal) - advised to check sugars at different times of the day  - 4x a day, rotating check times - advised for yearly eye exams >> she is UTD - return to clinic in 3-4 months  2. HL -Reviewed latest lipid panel from 02/2023: Fractions are excellent with the exception of a slightly low HDL: Lab Results  Component Value Date   CHOL 86 (L) 03/01/2023   HDL 37 (L) 03/01/2023   LDLCALC 29 03/01/2023   LDLDIRECT 95.5 12/02/2019   TRIG 103 03/01/2023   CHOLHDL 2.3 03/01/2023  -She continues Lipitor 80 mg daily and Tricor  48 mg daily without side effects  Lela Fendt, MD PhD [facility]

## 2023-05-24 NOTE — Telephone Encounter (Signed)
 Medication request pended for PCP.

## 2023-05-25 ENCOUNTER — Other Ambulatory Visit: Payer: Self-pay | Admitting: Internal Medicine

## 2023-05-25 ENCOUNTER — Other Ambulatory Visit: Payer: Self-pay

## 2023-05-25 DIAGNOSIS — E1142 Type 2 diabetes mellitus with diabetic polyneuropathy: Secondary | ICD-10-CM

## 2023-05-25 MED ORDER — GABAPENTIN 300 MG PO CAPS: 900.0000 mg | ORAL_CAPSULE | Freq: Every day | ORAL | 2 refills | Status: DC

## 2023-05-25 MED ORDER — GABAPENTIN 300 MG PO CAPS
900.0000 mg | ORAL_CAPSULE | Freq: Every day | ORAL | 2 refills | Status: DC
  Filled 2023-05-25: qty 90, 30d supply, fill #0

## 2023-05-26 ENCOUNTER — Other Ambulatory Visit: Payer: Self-pay

## 2023-06-08 ENCOUNTER — Other Ambulatory Visit: Payer: Self-pay

## 2023-06-08 ENCOUNTER — Telehealth: Payer: Self-pay | Admitting: Internal Medicine

## 2023-06-08 MED ORDER — ACCU-CHEK GUIDE TEST VI STRP: ORAL_STRIP | 6 refills | Status: AC

## 2023-06-08 MED ORDER — ACCU-CHEK SOFTCLIX LANCETS MISC: 6 refills | Status: AC

## 2023-06-08 NOTE — Telephone Encounter (Signed)
glucose blood (ACCU-CHEK GUIDE) test strip unavailable for reorder. Routing for approval.

## 2023-06-08 NOTE — Telephone Encounter (Signed)
Yes ma'am, done. 

## 2023-06-08 NOTE — Telephone Encounter (Signed)
Medication Refill -  Most Recent Primary Care Visit:  Provider: Jonah Blue B  Department: [facility]  Visit Type: OFFICE VISIT  Date: 03/01/2023  Medication: glucose blood (ACCU-CHEK GUIDE) test strip   Has the patient contacted their pharmacy? Yes   Is this the correct pharmacy for this prescription? Yes If no, delete pharmacy and type the correct one.  This is the patient's preferred pharmacy:   [id number] - [place], [place] - [address] Phone: [phone]  Fax: [phone]      Has the prescription been filled recently? No  Is the patient out of the medication? Yes and she is requesting these to be sent to a different pharmacy from last time  Has the patient been seen for an appointment in the last year OR does the patient have an upcoming appointment? Yes  Can we respond through MyChart? Yes  Please assist patient further

## 2023-06-09 ENCOUNTER — Other Ambulatory Visit: Payer: Self-pay

## 2023-06-21 ENCOUNTER — Other Ambulatory Visit: Payer: Self-pay

## 2023-07-02 ENCOUNTER — Other Ambulatory Visit: Payer: Self-pay

## 2023-07-05 ENCOUNTER — Encounter: Payer: Self-pay | Admitting: Internal Medicine

## 2023-07-05 ENCOUNTER — Ambulatory Visit: Payer: Medicaid Other | Attending: Internal Medicine | Admitting: Internal Medicine

## 2023-07-05 DIAGNOSIS — Z7985 Long-term (current) use of injectable non-insulin antidiabetic drugs: Secondary | ICD-10-CM | POA: Diagnosis not present

## 2023-07-05 DIAGNOSIS — I10 Essential (primary) hypertension: Secondary | ICD-10-CM

## 2023-07-05 DIAGNOSIS — G2581 Restless legs syndrome: Secondary | ICD-10-CM

## 2023-07-05 DIAGNOSIS — J302 Other seasonal allergic rhinitis: Secondary | ICD-10-CM

## 2023-07-05 DIAGNOSIS — Z889 Allergy status to unspecified drugs, medicaments and biological substances status: Secondary | ICD-10-CM

## 2023-07-05 DIAGNOSIS — E1169 Type 2 diabetes mellitus with other specified complication: Secondary | ICD-10-CM

## 2023-07-05 DIAGNOSIS — E1159 Type 2 diabetes mellitus with other circulatory complications: Secondary | ICD-10-CM

## 2023-07-05 DIAGNOSIS — Z6841 Body Mass Index (BMI) 40.0 and over, adult: Secondary | ICD-10-CM | POA: Diagnosis not present

## 2023-07-05 DIAGNOSIS — R197 Diarrhea, unspecified: Secondary | ICD-10-CM

## 2023-07-05 DIAGNOSIS — Z794 Long term (current) use of insulin: Secondary | ICD-10-CM

## 2023-07-05 DIAGNOSIS — E785 Hyperlipidemia, unspecified: Secondary | ICD-10-CM

## 2023-07-05 DIAGNOSIS — Z7984 Long term (current) use of oral hypoglycemic drugs: Secondary | ICD-10-CM

## 2023-07-05 DIAGNOSIS — E119 Type 2 diabetes mellitus without complications: Secondary | ICD-10-CM

## 2023-07-05 MED ORDER — LORATADINE 10 MG PO TABS: 10.0000 mg | ORAL_TABLET | Freq: Every day | ORAL | 6 refills | Status: DC

## 2023-07-05 NOTE — Patient Instructions (Signed)
[facility] [address] [place], [place] [address] PH# [phone]

## 2023-07-05 NOTE — Progress Notes (Addendum)
 Patient ID: Teresa Camacho, female    DOB: May 30, 1965  MRN: [id number]  CC: Hypertension (HTN & DM f/u./Discuss gabapentin & possible change to Cymbalta /Already received flu vax)   Subjective: Teresa Camacho is a 58 y.o. female who presents for chronic ds management. Her concerns today include:  Patient with history of DM type II with microalbumin, HTN, HL, OSA on CPAP, GERD,PCOS, pattern baldness, RLS (Gabapentin).   Discussed the use of AI scribe software for clinical note transcription with the patient, who gave verbal consent to proceed.  History of Present Illness   The patient, with a history of diabetes, restless leg syndrome, and neuropathy, presents for a four month follow-up.   DM/Obesity:  She recently saw her endocrinologist, who reported an A1c of 6.1 and continued her current regimen of Lantus insulin 120 units daily, Farxiga 5mg  daily, Ozempic 2mg  weekly, and metformin. Despite this regimen, the patient has not achieved any significant weight loss with Ozempic and has experienced severe diarrhea at times, which required an ER visit for fluids. The diarrhea has since improved, but the patient is hesitant to make any changes to her medication regimen until her symptoms stabilize.  We had discussed the diarrhea on last visit and I referred her to the gastroenterologist.  They tried reaching her unsuccessfully via phone.  They also sent a message to her MyChart which was looked at but she did not reply to them.  She does not recall the message.  The patient also reports ongoing symptoms of restless leg syndrome, despite taking gabapentin 900 mg at nights. She describes a need to constantly move her legs, especially at night, and occasional numbness at the crease of her toes. She expresses interest in trying Cymbalta, as it has helped her sister's neuropathy symptoms, but is open to other options.  HTN:   lisinopril 10mg  and metoprolol 25mg  twice daily. She is not currently monitoring  her blood pressure at home due to a malfunctioning cuff. Her cholesterol is well-controlled with atorvastatin and Tricor, with a recent LDL of 29. She also requests a refill on her Claritin for allergies.      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   History of allergy 12/26/2019   OSA on CPAP 12/26/2019   Class 3 severe obesity due to excess calories with serious comorbidity and body mass index (BMI) of 40.0 to 44.9 in adult [facility]) 12/26/2019   Hyperlipidemia associated with type 2 diabetes mellitus (HCC) 12/26/2019   Essential hypertension 12/26/2019   Family history of colonic polyps 12/26/2019   Gastroesophageal reflux disease 12/26/2019   Type 2 diabetes mellitus with hyperglycemia, with long-term current use of insulin (HCC) 11/30/2019   Iron deficiency anemia due to chronic blood loss 06/28/2013   S/P hysterectomy 06/13/2013     Current Outpatient Medications on File Prior to Visit  Medication Sig Dispense Refill   Accu-Chek Softclix Lancets lancets use one lancet to check blood glucose three times a day 100 each 6   albuterol (VENTOLIN HFA) 108 (90 Base) MCG/ACT inhaler Inhale 2 puffs into the lungs every 6 (six) hours as needed for wheezing or shortness of breath. 8 g 0   aspirin EC 81 MG tablet Take 1 tablet (81 mg total) by  mouth daily. Swallow whole. (Patient taking differently: Take 81 mg by mouth in the morning. Swallow whole.) 100 tablet 2   atorvastatin (LIPITOR) 80 MG tablet Take 1 tablet (80 mg total) by mouth daily. 90 tablet 1   blood glucose meter kit and supplies KIT Dispense based on patient and insurance preference. Use up to four times daily as directed. (FOR ICD-9 250.00, 250.01). 1 each 0   Blood Glucose Monitoring Suppl (ACCU-CHEK GUIDE) w/Device  KIT use to check blood glucose three times a day 1 kit 0   Cholecalciferol (VITAMIN D-3) 125 MCG (5000 UT) TABS Take 5,000 Units by mouth in the morning.     Continuous Glucose Receiver (FREESTYLE LIBRE 3 READER) DEVI Use as directed to check blood glucose. 1 each 0   Continuous Glucose Sensor (DEXCOM G7 SENSOR) MISC 3 each by Does not apply route every 30 (thirty) days. Apply 1 sensor every 10 days 9 each 4   Continuous Glucose Sensor (FREESTYLE LIBRE 3 SENSOR) MISC Change every 2 weeks 2 each 6   dapagliflozin propanediol (FARXIGA) 5 MG TABS tablet Take 1 tablet (5 mg total) by mouth daily. 90 tablet 3   diphenhydrAMINE (BENADRYL) 25 mg capsule Take 50 mg by mouth at bedtime.     diphenoxylate-atropine (LOMOTIL) 2.5-0.025 MG tablet Take 1 tablet by mouth 4 (four) times daily as needed for diarrhea or loose stools. 12 tablet 0   fenofibrate (TRICOR) 48 MG tablet TAKE 1 TABLET BY MOUTH DAILY 90 tablet 1   gabapentin (NEURONTIN) 300 MG capsule Take 3 capsules (900 mg total) by mouth at bedtime. 90 capsule 2   glucose blood (ACCU-CHEK GUIDE TEST) test strip Use to check blood sugar 3 times daily. 100 each 6   insulin glargine (LANTUS) 100 unit/mL SOPN Inject 110-120 Units into the skin daily. 90 mL 3   Insulin Pen Needle (TECHLITE PLUS PEN NEEDLES) 32G X 4 MM MISC Use in the morning and at bedtime 200 each 1   lisinopril (ZESTRIL) 10 MG tablet Take 1 tablet (10 mg total) by mouth daily. 90 tablet 3   lisinopril (ZESTRIL) 10 MG tablet Take 1 tablet (10 mg total) by mouth daily. 90 tablet 1   meclizine (ANTIVERT) 12.5 MG tablet Take 1 tablet (12.5 mg total) by mouth 2 (two) times daily as needed for dizziness. 60 tablet 3   Melatonin 10 MG SUBL Place 20 mg under the tongue at bedtime.     metFORMIN (GLUCOPHAGE-XR) 500 MG 24 hr tablet Take 4 tablets (2,000 mg total) by mouth daily. 360 tablet 3   metoprolol tartrate (LOPRESSOR) 25 MG tablet TAKE 1 TABLET BY MOUTH 2 TIMES A DAY 180 tablet 1   Multiple  Vitamins-Minerals (MULTIVITAMIN WITH MINERALS) tablet Take 1 tablet by mouth daily.     ondansetron (ZOFRAN-ODT) 4 MG disintegrating tablet Take 1 tablet (4 mg total) by mouth every 8 (eight) hours as needed for nausea or vomiting. 10 tablet 0   pantoprazole (PROTONIX) 40 MG tablet Take 1 tablet (40 mg total) by mouth 2 (two) times daily before a meal. Office visit for further refills 180 tablet 2   Semaglutide, 2 MG/DOSE, (OZEMPIC, 2 MG/DOSE,) 8 MG/3ML SOPN Inject 2 mg into the skin once a week. 9 mL 3   traZODone (DESYREL) 100 MG tablet Take 1 tablet (100 mg total) by mouth at bedtime. 90 tablet 1   No current facility-administered medications on file prior to visit.    Allergies  Allergen Reactions  Wound Dressing Adhesive Other (See Comments)    Redness/irritation   Codeine Nausea And Vomiting    Pt is able to take percocet & vicodin   Latex Rash    irritation    Social History   Socioeconomic History   Marital status: Divorced    Spouse name: Not on file   Number of children: 5   Years of education: Not on file   Highest education level: Not on file  Occupational History   Not on file  Tobacco Use   Smoking status: Never   Smokeless tobacco: Never  Vaping Use   Vaping status: Never Used  Substance and Sexual Activity   Alcohol use: No   Drug use: No   Sexual activity: Not Currently  Other Topics Concern   Not on file  Social History Narrative   Not on file   Social Drivers of Health   Financial Resource Strain: Medium Risk (03/01/2023)   Overall Financial Resource Strain (CARDIA)    Difficulty of Paying Living Expenses: Somewhat hard  Food Insecurity: Food Insecurity Present (03/01/2023)   Hunger Vital Sign    Worried About Running Out of Food in the Last Year: Sometimes true    Ran Out of Food in the Last Year: Sometimes true  Transportation Needs: No Transportation Needs (03/01/2023)   PRAPARE - Administrator, Civil Service (Medical): No     Lack of Transportation (Non-Medical): No  Physical Activity: Sufficiently Active (03/01/2023)   Exercise Vital Sign    Days of Exercise per Week: 4 days    Minutes of Exercise per Session: 40 min  Stress: No Stress Concern Present (03/01/2023)   [employer] of Occupational Health - Occupational Stress Questionnaire    Feeling of Stress : Not at all  Social Connections: Moderately Integrated (03/01/2023)   Social Connection and Isolation Panel [NHANES]    Frequency of Communication with Friends and Family: Three times a week    Frequency of Social Gatherings with Friends and Family: Twice a week    Attends Religious Services: More than 4 times per year    Active Member of [employer] or Organizations: Yes    Attends Engineer, structural: More than 4 times per year    Marital Status: Divorced  Intimate Partner Violence: Not At Risk (03/01/2023)   Humiliation, Afraid, Rape, and Kick questionnaire    Fear of Current or Ex-Partner: No    Emotionally Abused: No    Physically Abused: No    Sexually Abused: No    Family History  Problem Relation Age of Onset   Depression Mother    Diabetes Mother    Arthritis Mother    Heart disease Mother    Learning disabilities Mother    Mental illness Mother    Liver cancer Mother        metastatic from colon??   Arthritis Father    Prostate cancer Father        met to esophagus   Colon polyps Sister    Cancer Maternal Aunt        x 2 Aunts   Breast cancer Maternal Grandmother     Past Surgical History:  Procedure Laterality Date   ABDOMINAL HYSTERECTOMY N/A 06/13/2013   Procedure: HYSTERECTOMY ABDOMINAL and removal of vaginal skin tag;  Surgeon: Turner Daniels, MD;  Location: [facility];  Service: Gynecology;  Laterality: N/A;   APPENDECTOMY     BALLOON DILATION N/A 08/27/2020   Procedure: BALLOON DILATION;  Surgeon: Hilarie Fredrickson, MD;  Location: [facility];  Service: Endoscopy;  Laterality: N/A;   CESAREAN SECTION     3x    CHOLECYSTECTOMY     CHOLECYSTECTOMY  1991   COLONOSCOPY WITH PROPOFOL N/A 08/27/2020   Procedure: COLONOSCOPY WITH PROPOFOL;  Surgeon: Hilarie Fredrickson, MD;  Location: [facility];  Service: Endoscopy;  Laterality: N/A;   ESOPHAGOGASTRODUODENOSCOPY (EGD) WITH PROPOFOL N/A 08/27/2020   Procedure: ESOPHAGOGASTRODUODENOSCOPY (EGD) WITH PROPOFOL;  Surgeon: Hilarie Fredrickson, MD;  Location: [facility];  Service: Endoscopy;  Laterality: N/A;   POLYPECTOMY  08/27/2020   Procedure: POLYPECTOMY;  Surgeon: Hilarie Fredrickson, MD;  Location: [facility];  Service: Endoscopy;;    ROS: Review of Systems Negative except as stated above  PHYSICAL EXAM: BP 100/62 (BP Location: Left Arm, Patient Position: Sitting, Cuff Size: Normal)   Pulse 75   Temp (!) 97.5 F (36.4 C) (Oral)   Ht 5\' 1"  (1.549 m)   Wt 214 lb (97.1 kg)   LMP 03/13/2013   SpO2 97%   BMI 40.43 kg/m   Wt Readings from Last 3 Encounters:  07/05/23 214 lb (97.1 kg)  05/24/23 214 lb 3.2 oz (97.2 kg)  05/22/23 215 lb (97.5 kg)    Physical Exam  General appearance - alert, well appearing, and in no distress Mental status - normal mood, behavior, speech, dress, motor activity, and thought processes Neck - supple, no significant adenopathy Chest - clear to auscultation, no wheezes, rales or rhonchi, symmetric air entry Heart - normal rate, regular rhythm, normal S1, S2, no murmurs, rubs, clicks or gallops Extremities - peripheral pulses normal, no pedal edema, no clubbing or cyanosis      Latest Ref Rng & Units 05/22/2023   12:56 PM 03/01/2023    9:40 AM 03/16/2022   11:58 AM  CMP  Glucose 70 - 99 mg/dL 76  960  72   BUN 6 - 20 mg/dL 11  13  9    Creatinine 0.44 - 1.00 mg/dL 4.54  0.98  1.19   Sodium 135 - 145 mmol/L 139  141  141   Potassium 3.5 - 5.1 mmol/L 3.3  4.4  4.2   Chloride 98 - 111 mmol/L 108  104  103   CO2 22 - 32 mmol/L 19  23  22    Calcium 8.9 - 10.3 mg/dL 9.1  9.5  9.7   Total Protein 6.5 - 8.1 g/dL 6.7  6.4  6.9    Total Bilirubin 0.0 - 1.2 mg/dL 1.1  0.5  0.8   Alkaline Phos 38 - 126 U/L 53  52  69   AST 15 - 41 U/L 29  21  23    ALT 0 - 44 U/L 42  23  29    Lipid Panel     Component Value Date/Time   CHOL 86 (L) 03/01/2023 0940   TRIG 103 03/01/2023 0940   HDL 37 (L) 03/01/2023 0940   CHOLHDL 2.3 03/01/2023 0940   CHOLHDL 8.1 12/02/2019 0648   VLDL UNABLE TO CALCULATE IF TRIGLYCERIDE OVER 400 mg/dL 14/78/2956 2130   LDLCALC 29 03/01/2023 0940   LDLDIRECT 95.5 12/02/2019 0648    CBC    Component Value Date/Time   WBC 9.3 05/22/2023 1256   RBC 4.84 05/22/2023 1256   HGB 13.4 05/22/2023 1256   HGB 13.2 03/01/2023 0940   HCT 40.1 05/22/2023 1256   HCT 41.3 03/01/2023 0940   PLT 345 05/22/2023 1256   PLT 317 03/01/2023  0940   MCV 82.9 05/22/2023 1256   MCV 86 03/01/2023 0940   MCH 27.7 05/22/2023 1256   MCHC 33.4 05/22/2023 1256   RDW 15.1 05/22/2023 1256   RDW 13.9 03/01/2023 0940   LYMPHSABS 2.9 05/22/2023 1256   MONOABS 0.6 05/22/2023 1256   EOSABS 0.3 05/22/2023 1256   BASOSABS 0.1 05/22/2023 1256    ASSESSMENT AND PLAN: 1. Type 2 diabetes mellitus with morbid obesity (HCC) (Primary) 2. Diabetes mellitus treated with oral medication (HCC) 3. Long-term (current) use of injectable non-insulin antidiabetic drugs 4. Insulin long-term use (HCC) Most recent A1c at goal.  She will continue medications as prescribed by endocrinology that includes  Lantus insulin 120 units daily, Farxiga 5mg  daily, Ozempic 2mg  weekly, and metformin.  5. Diarrhea, unspecified type I copied the information for [place] gastroenterology on her discharge summary and have encouraged her to call them to schedule her appointment.  6. Hypertension associated with diabetes (HCC) At goal.  Continue lisinopril 10 mg daily and metoprolol 25 mg twice a day  7. Hyperlipidemia associated with type 2 diabetes mellitus (HCC) Continue atorvastatin 80 mg daily and fenofibrate 48 mg daily.  Most recent LDL  cholesterol at goal.  8. Seasonal Allergies - loratadine (CLARITIN) 10 MG tablet; Take 1 tablet (10 mg total) by mouth daily.  Dispense: 30 tablet; Refill: 6  9. RLS (restless legs syndrome) Advised patient that Cymbalta may not help with restless leg syndrome but will help with neuropathy symptoms related to diabetes.  Discussed other options like Lyrica or Requip.  She decided that she will pay more attention to her symptoms to see if it is more the neuropathy versus restless leg symptoms that she is experiencing before deciding to make a change.  For now she will continue with the gabapentin.  Addendum: #8 had stated history of allergy.  This was changed to seasonal allergies as patient is on loratadine for it. Patient was given the opportunity to ask questions.  Patient verbalized understanding of the plan and was able to repeat key elements of the plan.   This documentation was completed using Paediatric nurse.  Any transcriptional errors are unintentional.  No orders of the defined types were placed in this encounter.    Requested Prescriptions   Signed Prescriptions Disp Refills   loratadine (CLARITIN) 10 MG tablet 30 tablet 6    Sig: Take 1 tablet (10 mg total) by mouth daily.    Return in about 4 months (around 11/02/2023).  Jonah Blue, MD, FACP

## 2023-07-06 ENCOUNTER — Other Ambulatory Visit: Payer: Self-pay

## 2023-07-07 ENCOUNTER — Other Ambulatory Visit: Payer: Self-pay

## 2023-07-07 ENCOUNTER — Telehealth: Payer: Self-pay

## 2023-07-07 NOTE — Telephone Encounter (Signed)
 Pharmacy Patient Advocate Encounter  Received notification from [facility] MEDICAID that Prior Authorization for [facility] has been APPROVED from 07/06/2023 to 07/05/2024   PA #/Case ID/Reference #: [id number]

## 2023-07-28 ENCOUNTER — Other Ambulatory Visit: Payer: Self-pay

## 2023-07-28 ENCOUNTER — Other Ambulatory Visit (HOSPITAL_COMMUNITY): Payer: Self-pay

## 2023-08-09 ENCOUNTER — Other Ambulatory Visit: Payer: Self-pay | Admitting: Internal Medicine

## 2023-08-09 DIAGNOSIS — G4700 Insomnia, unspecified: Secondary | ICD-10-CM

## 2023-08-10 NOTE — Telephone Encounter (Signed)
 Requested by interface [email or web address] visit in 2 months.  Requested Prescriptions  Pending Prescriptions Disp Refills   traZODone (DESYREL) 100 MG tablet [Pharmacy Med Name: traZODone 100 MG TABLET] 90 tablet 0    Sig: TAKE 1 TABLET BY MOUTH AT BEDTIME     Psychiatry: Antidepressants - Serotonin Modulator Passed - 08/10/2023  1:28 PM      Passed - Valid encounter within last 6 months    Recent Outpatient Visits           1 month ago Type 2 diabetes mellitus with morbid obesity (HCC)   [place] [facility] Dept Of [place]. [facility] Jonah Blue B, MD   5 months ago Type 2 diabetes mellitus with morbid obesity [facility])   [place] [facility] - [facility] Dept Of [place]. [facility] Jonah Blue B, MD   12 months ago Type 2 diabetes mellitus with morbid obesity [facility])   [place] [facility] - A Dept Of [place]. [facility] Marcine Matar, MD   1 year ago Benign paroxysmal positional vertigo, unspecified laterality   [place] [facility] - A Dept Of [place]. [facility] Marcine Matar, MD   1 year ago Type 2 diabetes mellitus with peripheral neuropathy [facility])   [place] [facility] - A Dept Of [place]. [facility] Marcine Matar, MD       Future Appointments             In 2 months Laural Benes Binnie Rail, MD [facility] [place] - A Dept Of [place] Brideg[place]. [facility]

## 2023-08-22 ENCOUNTER — Other Ambulatory Visit: Payer: Self-pay | Admitting: Internal Medicine

## 2023-08-22 DIAGNOSIS — E1142 Type 2 diabetes mellitus with diabetic polyneuropathy: Secondary | ICD-10-CM

## 2023-08-23 NOTE — Telephone Encounter (Signed)
 Requested Prescriptions  Pending Prescriptions Disp Refills   gabapentin (NEURONTIN) 300 MG capsule [Pharmacy Med Name: GABAPENTIN 300 MG CAPSULE] 90 capsule 2    Sig: TAKE THREE CAPSULES BY MOUTH EVERY NIGHT AT BEDTIME     Neurology: Anticonvulsants - gabapentin Passed - 08/23/2023  5:31 PM      Passed - Cr in normal range and within 360 days    Creatinine, Ser  Date Value Ref Range Status  05/22/2023 0.62 0.44 - 1.00 mg/dL Final         Passed - Completed PHQ-2 or PHQ-9 in the last 360 days      Passed - Valid encounter within last 12 months    Recent Outpatient Visits           1 month ago Type 2 diabetes mellitus with morbid obesity (HCC)   [place] [facility] Dept Of [place]. [facility] Jonah Blue B, MD   5 months ago Type 2 diabetes mellitus with morbid obesity [facility])   [place] [facility] - A Dept Of [place]. [facility] Jonah Blue B, MD   1 year ago Type 2 diabetes mellitus with morbid obesity [facility])   [place] [facility] - A Dept Of [place]. [facility] Marcine Matar, MD   1 year ago Benign paroxysmal positional vertigo, unspecified laterality   [place] [facility] - A Dept Of [place]. [facility] Marcine Matar, MD   1 year ago Type 2 diabetes mellitus with peripheral neuropathy [facility])   [place] [facility] - A Dept Of [place]. [facility] Marcine Matar, MD       Future Appointments             In 2 months Laural Benes Binnie Rail, MD [facility] [place] - A Dept Of [place]. [facility]

## 2023-08-24 ENCOUNTER — Other Ambulatory Visit: Payer: Self-pay

## 2023-08-24 MED ORDER — FREESTYLE LIBRE 3 SENSOR MISC: 6 refills | Status: DC

## 2023-09-02 ENCOUNTER — Other Ambulatory Visit: Payer: Self-pay | Admitting: Internal Medicine

## 2023-09-02 DIAGNOSIS — G4700 Insomnia, unspecified: Secondary | ICD-10-CM

## 2023-09-17 ENCOUNTER — Other Ambulatory Visit: Payer: Self-pay | Admitting: Internal Medicine

## 2023-09-17 DIAGNOSIS — I152 Hypertension secondary to endocrine disorders: Secondary | ICD-10-CM

## 2023-09-17 DIAGNOSIS — E1169 Type 2 diabetes mellitus with other specified complication: Secondary | ICD-10-CM

## 2023-09-20 ENCOUNTER — Other Ambulatory Visit: Payer: Self-pay | Admitting: Internal Medicine

## 2023-09-20 DIAGNOSIS — E1142 Type 2 diabetes mellitus with diabetic polyneuropathy: Secondary | ICD-10-CM

## 2023-09-22 ENCOUNTER — Telehealth: Payer: Self-pay

## 2023-09-22 ENCOUNTER — Ambulatory Visit (INDEPENDENT_AMBULATORY_CARE_PROVIDER_SITE_OTHER): Payer: Medicaid Other | Admitting: Internal Medicine

## 2023-09-22 ENCOUNTER — Encounter: Payer: Self-pay | Admitting: Internal Medicine

## 2023-09-22 VITALS — BP 124/70 | HR 70 | Ht 61.0 in | Wt 218.2 lb

## 2023-09-22 DIAGNOSIS — G4700 Insomnia, unspecified: Secondary | ICD-10-CM

## 2023-09-22 DIAGNOSIS — Z7985 Long-term (current) use of injectable non-insulin antidiabetic drugs: Secondary | ICD-10-CM | POA: Diagnosis not present

## 2023-09-22 DIAGNOSIS — E1142 Type 2 diabetes mellitus with diabetic polyneuropathy: Secondary | ICD-10-CM

## 2023-09-22 DIAGNOSIS — Z7984 Long term (current) use of oral hypoglycemic drugs: Secondary | ICD-10-CM | POA: Diagnosis not present

## 2023-09-22 DIAGNOSIS — E1169 Type 2 diabetes mellitus with other specified complication: Secondary | ICD-10-CM

## 2023-09-22 DIAGNOSIS — E785 Hyperlipidemia, unspecified: Secondary | ICD-10-CM | POA: Diagnosis not present

## 2023-09-22 LAB — POCT GLYCOSYLATED HEMOGLOBIN (HGB A1C): Hemoglobin A1C: 6.1 % — AB (ref 4.0–5.6)

## 2023-09-22 LAB — MICROALBUMIN / CREATININE URINE RATIO
Creatinine, Urine: 94 mg/dL (ref 20–275)
Microalb Creat Ratio: 13 mg/g{creat} (ref <30)
Microalb, Ur: 1.2 mg/dL

## 2023-09-22 MED ORDER — TECHLITE PLUS PEN NEEDLES 32G X 4 MM MISC: 1.0000 | Freq: Two times a day (BID) | 6 refills | Status: DC

## 2023-09-22 MED ORDER — TRAZODONE HCL 100 MG PO TABS: 200.0000 mg | ORAL_TABLET | Freq: Every day | ORAL | 1 refills | Status: DC

## 2023-09-22 MED ORDER — FREESTYLE LIBRE 3 PLUS SENSOR MISC: 1.0000 | 3 refills | Status: AC

## 2023-09-22 NOTE — Patient Instructions (Addendum)
 Please continue: - Metformin  ER 1000 mg 2x a day - Farxiga  5 mg before b'fast - Ozempic  2 mg weekly  Please try to decrease: - Lantus  100 units daily in am   Please return in 3-4 months.

## 2023-09-22 NOTE — Addendum Note (Signed)
 Addended by: Vernon Goodpasture on: 09/22/2023 10:29 AM   Modules accepted: Orders

## 2023-09-22 NOTE — Progress Notes (Signed)
 Patient ID: Teresa Camacho, female   DOB: Aug 25, 1965, 58 y.o.   MRN: [id number]  HPI: Teresa Camacho is a 58 y.o.-year-old female, returning for follow-up for DM2, dx in 2021, insulin -dependent soon post dx., uncontrolled, with complications (PN). Pt. previously saw Dr. Washington Hacker, last visit with me 4 months ago.   She is here with her daughter, who is also my patient.  Interim history: No increased urination, blurry vision, nausea, chest pain. She had diarrhea immediately after she was eating >> now completely resolved. 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 She was able to obtain a CGM since last visit.  Reviewed HbA1c: Lab Results  Component Value Date   HGBA1C 6.1 (A) 05/24/2023   HGBA1C 5.6 02/12/2023   HGBA1C 6.4 (A) 10/09/2022   HGBA1C 6.2 (A) 06/10/2022   HGBA1C 6.6 (A) 02/06/2022   HGBA1C 7.3 (A) 10/06/2021   HGBA1C 7.3 (A) 07/30/2021   HGBA1C 8.9 (A) 05/28/2021   HGBA1C 8.0 (A) 03/26/2021   HGBA1C 8.2 (A) 01/24/2021   Pt is on a regimen of: - Metformin  ER 1000 >> 500 mg 2x a day, with meals -decreased due to diarrhea >> now back on 1000 mg 2x a day - Farxiga  5 mg daily in am - ran out >> restarted - Lantus  120 >> 140  >> 100 units daily (thighs) >> 120 units daily - splitting did not help - Ozempic  0.25 >> 0.5 >> 1 mg weekly - tried a higher dose - diarrhea, belching (Mylanta did not help) >> 2 mg weekly She was on Lantus  before >> sugars high later in the day. She was previously on Humulin  NPH 150 units daily.  Pt checks her sugars 4x a day and they are:  Prev.: - am: 83-158 >> 85-139 >> 90-low 100 >> 75, 81-121, 130, 169  - 2h after b'fast: n/c - before lunch: 55 >> n/c >> 88, 99 >> n/c - 2h after lunch: n/c >> 153 >> n/c - before dinner: n/c >> 70-105 >> 111-139 >> n/c >> 79, 97-137 - 2h after dinner: 144-202 >> 130-150 >> 131-186, 180, 230, 258 - bedtime: n/c - nighttime: n/c Lowest sugar was 56 >> 82 >> 85 >> 90 >> 69; she has hypoglycemia awareness  at 70.  Highest sugar was 266 >> 245 >> 202 >> 150 >> 200s. She developed skin irritation from the freestyle libre CGM.  Glucometer: True Metrix  - no CKD, last BUN/creatinine:  Lab Results  Component Value Date   BUN 11 05/22/2023   BUN 13 03/01/2023   CREATININE 0.62 05/22/2023   CREATININE 0.76 03/01/2023   Lab Results  Component Value Date   MICRALBCREAT 30 (H) 03/01/2023   MICRALBCREAT 25 03/16/2022   MICRALBCREAT 14 03/13/2021   MICRALBCREAT 231 (H) 12/26/2019  She is on lisinopril  10 mg daily.  - +HL; last set of lipids: Lab Results  Component Value Date   CHOL 86 (L) 03/01/2023   HDL 37 (L) 03/01/2023   LDLCALC 29 03/01/2023   LDLDIRECT 95.5 12/02/2019   TRIG 103 03/01/2023   CHOLHDL 2.3 03/01/2023  She is on Lipitor 80 mg daily and Tricor  48 mg daily.  - last eye exam was 09/03/2022. No DR.   - + numbness and tingling in her feet.  She is on Neurontin  300 mg x 2 at bedtime.  Last foot exam 02/12/2023  She has a history of PCOS, HTN, OSA, GERD, fibroids, s/p TAH. She has diarrhea immediately after she  eats.  She was in the emergency room with severe diarrhea 05/22/2023.  ROS: + see HPI  Past Medical History:  Diagnosis Date   Anemia    Blood transfusion without reported diagnosis    Fibroids, submucosal 05/03/2013   GERD (gastroesophageal reflux disease)    Hyperlipidemia    Hypertension    PCOS (polycystic ovarian syndrome)    PONV (postoperative nausea and vomiting)    " super tight airway" as per pt   Sleep apnea    Type II diabetes mellitus (HCC)    Past Surgical History:  Procedure Laterality Date   ABDOMINAL HYSTERECTOMY N/A 06/13/2013   Procedure: HYSTERECTOMY ABDOMINAL and removal of vaginal skin tag;  Surgeon: Denette Finner, MD;  Location: [facility];  Service: Gynecology;  Laterality: N/A;   APPENDECTOMY     BALLOON DILATION N/A 08/27/2020   Procedure: BALLOON DILATION;  Surgeon: Tobin Forts, MD;  Location: [facility];  Service: Endoscopy;   Laterality: N/A;   CESAREAN SECTION     3x   CHOLECYSTECTOMY     CHOLECYSTECTOMY  1991   COLONOSCOPY WITH PROPOFOL  N/A 08/27/2020   Procedure: COLONOSCOPY WITH PROPOFOL ;  Surgeon: Tobin Forts, MD;  Location: [facility];  Service: Endoscopy;  Laterality: N/A;   ESOPHAGOGASTRODUODENOSCOPY (EGD) WITH PROPOFOL  N/A 08/27/2020   Procedure: ESOPHAGOGASTRODUODENOSCOPY (EGD) WITH PROPOFOL ;  Surgeon: Tobin Forts, MD;  Location: [facility];  Service: Endoscopy;  Laterality: N/A;   POLYPECTOMY  08/27/2020   Procedure: POLYPECTOMY;  Surgeon: Tobin Forts, MD;  Location: [facility];  Service: Endoscopy;;   Social History   Socioeconomic History   Marital status: Divorced    Spouse name: Not on file   Number of children: 5   Years of education: Not on file   Highest education level: Not on file  Occupational History   Not on file  Tobacco Use   Smoking status: Never   Smokeless tobacco: Never  Vaping Use   Vaping status: Never Used  Substance and Sexual Activity   Alcohol use: No   Drug use: No   Sexual activity: Not Currently  Other Topics Concern   Not on file  Social History Narrative   Not on file   Social Drivers of Health   Financial Resource Strain: Medium Risk (03/01/2023)   Overall Financial Resource Strain (CARDIA)    Difficulty of Paying Living Expenses: Somewhat hard  Food Insecurity: Food Insecurity Present (03/01/2023)   Hunger Vital Sign    Worried About Running Out of Food in the Last Year: Sometimes true    Ran Out of Food in the Last Year: Sometimes true  Transportation Needs: No Transportation Needs (03/01/2023)   PRAPARE - Administrator, Civil Service (Medical): No    Lack of Transportation (Non-Medical): No  Physical Activity: Sufficiently Active (03/01/2023)   Exercise Vital Sign    Days of Exercise per Week: 4 days    Minutes of Exercise per Session: 40 min  Stress: No Stress Concern Present (03/01/2023)   [employer] of  Occupational Health - Occupational Stress Questionnaire    Feeling of Stress : Not at all  Social Connections: Moderately Integrated (03/01/2023)   Social Connection and Isolation Panel [NHANES]    Frequency of Communication with Friends and Family: Three times a week    Frequency of Social Gatherings with Friends and Family: Twice a week    Attends Religious Services: More than 4 times per year    Active Member of  Clubs or Organizations: Yes    Attends Banker Meetings: More than 4 times per year    Marital Status: Divorced  Intimate Partner Violence: Not At Risk (03/01/2023)   Humiliation, Afraid, Rape, and Kick questionnaire    Fear of Current or Ex-Partner: No    Emotionally Abused: No    Physically Abused: No    Sexually Abused: No   Current Outpatient Medications on File Prior to Visit  Medication Sig Dispense Refill   Accu-Chek Softclix Lancets lancets use one lancet to check blood glucose three times a day 100 each 6   albuterol  (VENTOLIN  HFA) 108 (90 Base) MCG/ACT inhaler Inhale 2 puffs into the lungs every 6 (six) hours as needed for wheezing or shortness of breath. 8 g 0   aspirin  EC 81 MG tablet Take 1 tablet (81 mg total) by mouth daily. Swallow whole. (Patient taking differently: Take 81 mg by mouth in the morning. Swallow whole.) 100 tablet 2   atorvastatin  (LIPITOR) 80 MG tablet TAKE 1 TABLET BY MOUTH DAILY 90 tablet 1   blood glucose meter kit and supplies KIT Dispense based on patient and insurance preference. Use up to four times daily as directed. (FOR ICD-9 250.00, 250.01). 1 each 0   Blood Glucose Monitoring Suppl (ACCU-CHEK GUIDE) w/Device KIT use to check blood glucose three times a day 1 kit 0   Cholecalciferol (VITAMIN D-3) 125 MCG (5000 UT) TABS Take 5,000 Units by mouth in the morning.     Continuous Glucose Receiver (FREESTYLE LIBRE 3 READER) DEVI Use as directed to check blood glucose. 1 each 0   Continuous Glucose Sensor (FREESTYLE LIBRE 3  SENSOR) MISC Change every 2 weeks 2 each 6   dapagliflozin  propanediol (FARXIGA ) 5 MG TABS tablet Take 1 tablet (5 mg total) by mouth daily. 90 tablet 3   diphenhydrAMINE  (BENADRYL ) 25 mg capsule Take 50 mg by mouth at bedtime.     diphenoxylate -atropine  (LOMOTIL ) 2.5-0.025 MG tablet Take 1 tablet by mouth 4 (four) times daily as needed for diarrhea or loose stools. 12 tablet 0   fenofibrate  (TRICOR ) 48 MG tablet TAKE 1 TABLET BY MOUTH DAILY 90 tablet 1   gabapentin  (NEURONTIN ) 300 MG capsule TAKE THREE CAPSULES BY MOUTH EVERY NIGHT AT BEDTIME 90 capsule 2   glucose blood (ACCU-CHEK GUIDE TEST) test strip Use to check blood sugar 3 times daily. 100 each 6   insulin  glargine (LANTUS ) 100 unit/mL SOPN Inject 110-120 Units into the skin daily. 90 mL 3   Insulin  Pen Needle (TECHLITE PLUS PEN NEEDLES) 32G X 4 MM MISC Use in the morning and at bedtime 200 each 1   lisinopril  (ZESTRIL ) 10 MG tablet Take 1 tablet (10 mg total) by mouth daily. 90 tablet 3   lisinopril  (ZESTRIL ) 10 MG tablet Take 1 tablet (10 mg total) by mouth daily. 90 tablet 1   lisinopril  (ZESTRIL ) 10 MG tablet TAKE 1 TABLET BY MOUTH DAILY 90 tablet 1   loratadine  (CLARITIN ) 10 MG tablet Take 1 tablet (10 mg total) by mouth daily. 30 tablet 6   meclizine  (ANTIVERT ) 12.5 MG tablet Take 1 tablet (12.5 mg total) by mouth 2 (two) times daily as needed for dizziness. 60 tablet 3   Melatonin 10 MG SUBL Place 20 mg under the tongue at bedtime.     metFORMIN  (GLUCOPHAGE -XR) 500 MG 24 hr tablet Take 4 tablets (2,000 mg total) by mouth daily. 360 tablet 3   metoprolol  tartrate (LOPRESSOR ) 25 MG tablet TAKE 1  TABLET BY MOUTH 2 TIMES A DAY 180 tablet 1   Multiple Vitamins-Minerals (MULTIVITAMIN WITH MINERALS) tablet Take 1 tablet by mouth daily.     ondansetron  (ZOFRAN -ODT) 4 MG disintegrating tablet Take 1 tablet (4 mg total) by mouth every 8 (eight) hours as needed for nausea or vomiting. 10 tablet 0   pantoprazole  (PROTONIX ) 40 MG tablet Take 1  tablet (40 mg total) by mouth 2 (two) times daily before a meal. Office visit for further refills 180 tablet 2   Semaglutide , 2 MG/DOSE, (OZEMPIC , 2 MG/DOSE,) 8 MG/3ML SOPN Inject 2 mg into the skin once a week. 9 mL 3   traZODone  (DESYREL ) 100 MG tablet TAKE 1 TABLET BY MOUTH AT BEDTIME 90 tablet 0   No current facility-administered medications on file prior to visit.   Allergies  Allergen Reactions   Wound Dressing Adhesive Other (See Comments)    Redness/irritation   Codeine Nausea And Vomiting    Pt is able to take percocet & vicodin   Latex Rash    irritation   Family History  Problem Relation Age of Onset   Depression Mother    Diabetes Mother    Arthritis Mother    Heart disease Mother    Learning disabilities Mother    Mental illness Mother    Liver cancer Mother        metastatic from colon??   Arthritis Father    Prostate cancer Father        met to esophagus   Colon polyps Sister    Cancer Maternal Aunt        x 2 Aunts   Breast cancer Maternal Grandmother    PE: BP 124/70   Pulse 70   Ht 5\' 1"  (1.549 m)   Wt 218 lb 3.2 oz (99 kg)   LMP 03/13/2013   SpO2 96%   BMI 41.23 kg/m  Wt Readings from Last 3 Encounters:  09/22/23 218 lb 3.2 oz (99 kg)  07/05/23 214 lb (97.1 kg)  05/24/23 214 lb 3.2 oz (97.2 kg)   Constitutional: overweight, in NAD Eyes:  EOMI, no exophthalmos ENT: no neck masses, no cervical lymphadenopathy Cardiovascular: RRR, No MRG Respiratory: CTA B Musculoskeletal: no deformities Skin:no rashes Neurological: no tremor with outstretched hands  ASSESSMENT: 1. DM2, insulin -dependent, uncontrolled, with complications - PN  2. HL  PLAN:  1. Patient with longstanding, uncontrolled, type 2 diabetes, on oral antidiabetic regimen with SGLT2 inhibitor and metformin  along with high-dose basal insulin  and weekly GLP-1 receptor agonist, with improved control.  HbA1c at last visit was 6.1%, at goal, slightly higher than before.  At last visit  she felt that blood sugars were a little worse during the holidays and a COVID-19 infection and she was using higher doses of Lantus  than recommended.  We discussed about possibly splitting the dose of Lantus  into 2 injections at the same time to avoid decreased absorption from the injection site and also pain with the injections. She mentions that she tried to split the Lantus  into an injection in the morning and 1 at night, but sugars were higher so she moved to full dose back to the morning.  She does want injection of 80 units and another of 40 units. - She previously inquired about switching from Ozempic  to Mounjaro but as she had an exaggerated gastrocolic reflex, I did not recommend it.  I did recommend to see GI for this.  Her diarrhea was worse at last visit and I advised her  to decrease metformin  and if the diarrhea did not improve, to even stop it. She is now back to the full dose Metformin  as her diarrhea resolved. -At last visit, she mentions skin irritation with the freestyle libre CGM sensor and I sent a prescription for the Dexcom G7 sensor to her pharmacy.  She was able to obtain the Dexcom CGM, which she tolerates well. CGM interpretation: -At today's visit, we reviewed her CGM downloads: It appears that 97% of values are in target range (goal >70%), while 3% are higher than 180 (goal <25%), and 0% are lower than 70 (goal <4%).  The calculated average blood sugar is 124.  The projected HbA1c for the next 3 months (GMI) is 6.3%. -Reviewing the CGM trends, sugars appear to be almost entirely at goal, fluctuating within a relatively narrow range within the target range with slightly higher blood sugars after dinner.  Sugars are improving overnight and we have room to decrease her insulin  doses.  We discussed that this may help with weight loss, also.  We can continue the rest of the regimen for now. - I suggested to:  Patient Instructions  Please continue: - Metformin  ER 1000 mg 2x a day -  Farxiga  5 mg before b'fast - Ozempic  2 mg weekly  Please try to decrease: - Lantus  100 units daily in am   Please return in 3-4 months.   - we checked her HbA1c: 6.1% (stable) - advised to check sugars at different times of the day - 4x a day, rotating check times - advised for yearly eye exams >> she is due -she would like to change her eye doctor as she did not have a good experience before. - return to clinic in 3-4 months  2. HL - Reviewed latest lipid panel from 02/2023: Fractions at goal with exception of a low HDL: Lab Results  Component Value Date   CHOL 86 (L) 03/01/2023   HDL 37 (L) 03/01/2023   LDLCALC 29 03/01/2023   LDLDIRECT 95.5 12/02/2019   TRIG 103 03/01/2023   CHOLHDL 2.3 03/01/2023  -She continues on Lipitor 80 mg daily and Tricor  48 mg daily without side effects  Emilie Harden, MD PhD [facility]

## 2023-09-22 NOTE — Telephone Encounter (Signed)
 Pt needs a PA for Roma 3 PLus

## 2023-09-22 NOTE — Telephone Encounter (Signed)
 I called and spoke to pt.  She informed me that she has been on Trazodone  200 mg for sometime so she was taking two of the 100 mg tabs.  I told her I will send an updated prescription.

## 2023-09-22 NOTE — Addendum Note (Signed)
 Addended by: Concetta Dee B on: 09/22/2023 03:41 PM   Modules accepted: Orders

## 2023-09-22 NOTE — Telephone Encounter (Signed)
 Sample  Device/Supplies: Libre 3 plus Quantity:1 AOZ:[id number] EXP:02/15/24

## 2023-09-22 NOTE — Telephone Encounter (Addendum)
 Spoke with Cassie from [employer], she states pt is wanting an early refill on Trazodone  due to taking 2 tablets when instructions states 1 tablet daily. Please advise.   Copied from CRM [id number]. Topic: Clinical - Prescription Issue >> Sep 22, 2023 10:31 AM Teresa Camacho wrote: Reason for CRM: [facility] is calling needing clarification on directions please call [facility] [phone]

## 2023-09-23 ENCOUNTER — Encounter: Payer: Self-pay | Admitting: Internal Medicine

## 2023-09-23 ENCOUNTER — Telehealth: Payer: Self-pay

## 2023-09-23 NOTE — Telephone Encounter (Signed)
 Pharmacy Patient Advocate Encounter  Received notification from [facility] that Prior Authorization for FreeStyle Libre 3 Sensor has been APPROVED from 09-23-2023 to 09-22-2024   PA #/Case ID/Reference #: [id number]

## 2023-09-23 NOTE — Telephone Encounter (Signed)
 Pharmacy Patient Advocate Encounter   Received notification from Pt Calls Messages that prior authorization for Freestyle libre 3 plus is required/requested.   Insurance verification completed.   The patient is insured through [facility] .   Per test claim: PA required; PA submitted to above mentioned insurance via CoverMyMeds Key/confirmation #/EOC [id number] Status is pending

## 2023-09-27 ENCOUNTER — Other Ambulatory Visit: Payer: Self-pay | Admitting: Internal Medicine

## 2023-09-27 DIAGNOSIS — E1169 Type 2 diabetes mellitus with other specified complication: Secondary | ICD-10-CM

## 2023-09-28 ENCOUNTER — Other Ambulatory Visit: Payer: Self-pay | Admitting: Internal Medicine

## 2023-09-28 DIAGNOSIS — E785 Hyperlipidemia, unspecified: Secondary | ICD-10-CM

## 2023-11-01 ENCOUNTER — Other Ambulatory Visit: Payer: Self-pay

## 2023-11-01 ENCOUNTER — Encounter: Payer: Self-pay | Admitting: Internal Medicine

## 2023-11-01 ENCOUNTER — Ambulatory Visit: Payer: Medicaid Other | Attending: Internal Medicine | Admitting: Internal Medicine

## 2023-11-01 DIAGNOSIS — E119 Type 2 diabetes mellitus without complications: Secondary | ICD-10-CM

## 2023-11-01 DIAGNOSIS — F5104 Psychophysiologic insomnia: Secondary | ICD-10-CM | POA: Diagnosis not present

## 2023-11-01 DIAGNOSIS — Z7984 Long term (current) use of oral hypoglycemic drugs: Secondary | ICD-10-CM

## 2023-11-01 DIAGNOSIS — E785 Hyperlipidemia, unspecified: Secondary | ICD-10-CM | POA: Diagnosis not present

## 2023-11-01 DIAGNOSIS — Z7985 Long-term (current) use of injectable non-insulin antidiabetic drugs: Secondary | ICD-10-CM | POA: Diagnosis not present

## 2023-11-01 DIAGNOSIS — E1159 Type 2 diabetes mellitus with other circulatory complications: Secondary | ICD-10-CM

## 2023-11-01 DIAGNOSIS — E1169 Type 2 diabetes mellitus with other specified complication: Secondary | ICD-10-CM

## 2023-11-01 DIAGNOSIS — I10 Essential (primary) hypertension: Secondary | ICD-10-CM

## 2023-11-01 DIAGNOSIS — Z794 Long term (current) use of insulin: Secondary | ICD-10-CM

## 2023-11-01 DIAGNOSIS — Z6841 Body Mass Index (BMI) 40.0 and over, adult: Secondary | ICD-10-CM

## 2023-11-01 MED ORDER — RAMELTEON 8 MG PO TABS: 8.0000 mg | ORAL_TABLET | Freq: Every day | ORAL | 1 refills | Status: DC

## 2023-11-01 NOTE — Progress Notes (Signed)
 Patient ID: Teresa Camacho, female    DOB: October 29, 1965  MRN: [id number]  CC: Diabetes (Dm f/u. Ilene Malling falling & staying asleep. Sleeping 5-6 hrs/)   Subjective: Teresa Camacho is a 58 y.o. female who presents for chronic ds management. Her concerns today include:  Patient with history of DM type II with microalbumin, HTN, HL, OSA on CPAP, GERD,PCOS, pattern baldness, RLS (Gabapentin ).   Discussed the use of AI scribe software for clinical note transcription with the patient, who gave verbal consent to proceed.  History of Present Illness Teresa Camacho is a 58 year old female who presents for a four-month follow-up for insomnia.  She experiences chronic difficulty falling and staying asleep, worsening over the past six months. Despite good sleep hygiene practices, including winding down, using white noise, and maintaining a cool environment, she takes one to two hours to fall asleep.  She gets in bed around 11 PM.  She avoids caffeine and does not drink alcohol and has tried using a TV with a timer set to low volume.  When she does fall asleep, she twists and turns all night.  Gets up around 5:30 AM to attend to her dog and then gets back in bed to sleep about an additional 2 hours.  Feels tired during the day.   She takes Benadryl  for nighttime hives and trazodone  200 mg for sleep, along with melatonin gummies, 30 to 45 minutes before bed, but continues to experience poor sleep quality.  DM:  Lab Results  Component Value Date   HGBA1C 6.1 (A) 09/22/2023  She continues to follow with endocrinologist Dr. Aldona Amel whom she last saw 09/22/2023.  Her diabetes management includes metformin  extended release 1 gram twice a day, Farxiga  5 mg daily, Ozempic  2 mg once a week, and Lantus  insulin  100 units. She maintains good eating habits and exercises by walking her dog for 1.5 hours daily.  For hyperlipidemia, she takes atorvastatin  80 mg and Tricor  40 mg. Her last cholesterol levels showed an LDL of  29.  HTN: Blood pressure noted to be low today.  She denies any dizziness.  She takes lisinopril  10 mg daily and metoprolol  25 mg twice a day.    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   History of allergy 12/26/2019   OSA on CPAP 12/26/2019   Class 3 severe obesity due to excess calories with serious comorbidity and body mass index (BMI) of 40.0 to 44.9 in adult 12/26/2019   Hyperlipidemia associated with type 2 diabetes mellitus (HCC) 12/26/2019   Essential hypertension 12/26/2019   Family history of colonic polyps 12/26/2019   Gastroesophageal reflux disease 12/26/2019   Type 2 diabetes mellitus with hyperglycemia, with long-term current use of insulin  (HCC) 11/30/2019   Iron deficiency anemia due to chronic blood loss 06/28/2013   S/P hysterectomy 06/13/2013     Current Outpatient Medications on File Prior to Visit  Medication Sig Dispense Refill   Accu-Chek Softclix Lancets lancets use one lancet to check blood glucose three times a day 100 each 6   albuterol  (VENTOLIN  HFA) 108 (90 Base) MCG/ACT inhaler Inhale 2 puffs into the lungs every 6 (six) hours as needed for wheezing or shortness of breath. 8 g 0   aspirin  EC  81 MG tablet Take 1 tablet (81 mg total) by mouth daily. Swallow whole. (Patient taking differently: Take 81 mg by mouth in the morning. Swallow whole.) 100 tablet 2   atorvastatin  (LIPITOR) 80 MG tablet TAKE 1 TABLET BY MOUTH DAILY 90 tablet 1   blood glucose meter kit and supplies KIT Dispense based on patient and insurance preference. Use up to four times daily as directed. (FOR ICD-9 250.00, 250.01). 1 each 0   Blood Glucose Monitoring Suppl (ACCU-CHEK GUIDE) w/Device KIT use to check blood glucose three times a day 1 kit 0   Cholecalciferol  (VITAMIN D-3) 125 MCG (5000 UT) TABS Take 5,000 Units by mouth in the morning.     Continuous Glucose Receiver (FREESTYLE LIBRE 3 READER) DEVI Use as directed to check blood glucose. 1 each 0   Continuous Glucose Sensor (FREESTYLE LIBRE 3 PLUS SENSOR) MISC Inject 1 Device into the skin continuous. Change every 15 days 6 each 3   dapagliflozin  propanediol (FARXIGA ) 5 MG TABS tablet Take 1 tablet (5 mg total) by mouth daily. 90 tablet 3   diphenhydrAMINE  (BENADRYL ) 25 mg capsule Take 50 mg by mouth at bedtime.     diphenoxylate -atropine  (LOMOTIL ) 2.5-0.025 MG tablet Take 1 tablet by mouth 4 (four) times daily as needed for diarrhea or loose stools. (Patient not taking: Reported on 09/22/2023) 12 tablet 0   fenofibrate  (TRICOR ) 48 MG tablet TAKE 1 TABLET BY MOUTH DAILY 90 tablet 1   gabapentin  (NEURONTIN ) 300 MG capsule TAKE THREE CAPSULES BY MOUTH EVERY NIGHT AT BEDTIME 90 capsule 2   glucose blood (ACCU-CHEK GUIDE TEST) test strip Use to check blood sugar 3 times daily. 100 each 6   insulin  glargine (LANTUS ) 100 unit/mL SOPN Inject 110-120 Units into the skin daily. 90 mL 3   Insulin  Pen Needle (TECHLITE PLUS PEN NEEDLES) 32G X 4 MM MISC Use in the morning and at bedtime 200 each 6   lisinopril  (ZESTRIL ) 10 MG tablet Take 1 tablet (10 mg total) by mouth daily. 90 tablet 3   loratadine  (CLARITIN ) 10 MG tablet Take 1 tablet (10 mg total) by mouth daily. 30 tablet 6   Melatonin 10 MG SUBL Place 20 mg under the tongue at bedtime.     metFORMIN  (GLUCOPHAGE -XR) 500 MG 24 hr tablet Take 4 tablets (2,000 mg total) by mouth daily. 360 tablet 3   metoprolol  tartrate (LOPRESSOR ) 25 MG tablet TAKE 1 TABLET BY MOUTH 2 TIMES A DAY 180 tablet 1   Multiple Vitamins-Minerals (MULTIVITAMIN WITH MINERALS) tablet Take 1 tablet by mouth daily.     ondansetron  (ZOFRAN -ODT) 4 MG disintegrating tablet Take 1 tablet (4 mg total) by mouth every 8 (eight) hours as needed for nausea or vomiting. 10 tablet 0   pantoprazole   (PROTONIX ) 40 MG tablet Take 1 tablet (40 mg total) by mouth 2 (two) times daily before a meal. Office visit for further refills 180 tablet 2   Semaglutide , 2 MG/DOSE, (OZEMPIC , 2 MG/DOSE,) 8 MG/3ML SOPN Inject 2 mg into the skin once a week. 9 mL 3   traZODone  (DESYREL ) 100 MG tablet Take 2 tablets (200 mg total) by mouth at bedtime. 180 tablet 1   No current facility-administered medications on file prior to visit.    Allergies  Allergen Reactions   Wound Dressing Adhesive Other (See Comments)    Redness/irritation   Codeine Nausea And Vomiting    Pt is able to take percocet & vicodin   Latex Rash    irritation  Social History   Socioeconomic History   Marital status: Divorced    Spouse name: Not on file   Number of children: 5   Years of education: Not on file   Highest education level: Not on file  Occupational History   Not on file  Tobacco Use   Smoking status: Never   Smokeless tobacco: Never  Vaping Use   Vaping status: Never Used  Substance and Sexual Activity   Alcohol use: No   Drug use: No   Sexual activity: Not Currently  Other Topics Concern   Not on file  Social History Narrative   Not on file   Social Drivers of Health   Financial Resource Strain: Medium Risk (03/01/2023)   Overall Financial Resource Strain (CARDIA)    Difficulty of Paying Living Expenses: Somewhat hard  Food Insecurity: Food Insecurity Present (03/01/2023)   Hunger Vital Sign    Worried About Running Out of Food in the Last Year: Sometimes true    Ran Out of Food in the Last Year: Sometimes true  Transportation Needs: No Transportation Needs (03/01/2023)   PRAPARE - Administrator, Civil Service (Medical): No    Lack of Transportation (Non-Medical): No  Physical Activity: Sufficiently Active (03/01/2023)   Exercise Vital Sign    Days of Exercise per Week: 4 days    Minutes of Exercise per Session: 40 min  Stress: No Stress Concern Present (03/01/2023)   [employer] of Occupational Health - Occupational Stress Questionnaire    Feeling of Stress : Not at all  Social Connections: Moderately Integrated (03/01/2023)   Social Connection and Isolation Panel    Frequency of Communication with Friends and Family: Three times a week    Frequency of Social Gatherings with Friends and Family: Twice a week    Attends Religious Services: More than 4 times per year    Active Member of [employer] or Organizations: Yes    Attends Engineer, structural: More than 4 times per year    Marital Status: Divorced  Intimate Partner Violence: Not At Risk (03/01/2023)   Humiliation, Afraid, Rape, and Kick questionnaire    Fear of Current or Ex-Partner: No    Emotionally Abused: No    Physically Abused: No    Sexually Abused: No    Family History  Problem Relation Age of Onset   Depression Mother    Diabetes Mother    Arthritis Mother    Heart disease Mother    Learning disabilities Mother    Mental illness Mother    Liver cancer Mother        metastatic from colon??   Arthritis Father    Prostate cancer Father        met to esophagus   Colon polyps Sister    Cancer Maternal Aunt        x 2 Aunts   Breast cancer Maternal Grandmother     Past Surgical History:  Procedure Laterality Date   ABDOMINAL HYSTERECTOMY N/A 06/13/2013   Procedure: HYSTERECTOMY ABDOMINAL and removal of vaginal skin tag;  Surgeon: Denette Finner, MD;  Location: [facility];  Service: Gynecology;  Laterality: N/A;   APPENDECTOMY     BALLOON DILATION N/A 08/27/2020   Procedure: BALLOON DILATION;  Surgeon: Tobin Forts, MD;  Location: [facility];  Service: Endoscopy;  Laterality: N/A;   CESAREAN SECTION     3x   CHOLECYSTECTOMY     CHOLECYSTECTOMY  1991   COLONOSCOPY WITH  PROPOFOL  N/A 08/27/2020   Procedure: COLONOSCOPY WITH PROPOFOL ;  Surgeon: Tobin Forts, MD;  Location: [facility];  Service: Endoscopy;  Laterality: N/A;   ESOPHAGOGASTRODUODENOSCOPY (EGD) WITH PROPOFOL  N/A  08/27/2020   Procedure: ESOPHAGOGASTRODUODENOSCOPY (EGD) WITH PROPOFOL ;  Surgeon: Tobin Forts, MD;  Location: [facility];  Service: Endoscopy;  Laterality: N/A;   POLYPECTOMY  08/27/2020   Procedure: POLYPECTOMY;  Surgeon: Tobin Forts, MD;  Location: [facility];  Service: Endoscopy;;    ROS: Review of Systems Negative except as stated above  PHYSICAL EXAM: BP 93/63   Pulse 72   Temp 98.2 F (36.8 C) (Oral)   Ht 5' 1 (1.549 m)   Wt 217 lb (98.4 kg)   LMP 03/13/2013   BMI 41.00 kg/m   Wt Readings from Last 3 Encounters:  11/01/23 217 lb (98.4 kg)  09/22/23 218 lb 3.2 oz (99 kg)  07/05/23 214 lb (97.1 kg)    Physical Exam  General appearance - alert, well appearing, older Caucasian female obese and in no distress Mental status - normal mood, behavior, speech, dress, motor activity, and thought processes Neck - supple, no significant adenopathy Chest - clear to auscultation, no wheezes, rales or rhonchi, symmetric air entry Heart - normal rate, regular rhythm, normal S1, S2, no murmurs, rubs, clicks or gallops Extremities - peripheral pulses normal, no pedal edema, no clubbing or cyanosis      Latest Ref Rng & Units 05/22/2023   12:56 PM 03/01/2023    9:40 AM 03/16/2022   11:58 AM  CMP  Glucose 70 - 99 mg/dL 76  161  72   BUN 6 - 20 mg/dL 11  13  9    Creatinine 0.44 - 1.00 mg/dL 0.96  0.45  4.09   Sodium 135 - 145 mmol/L 139  141  141   Potassium 3.5 - 5.1 mmol/L 3.3  4.4  4.2   Chloride 98 - 111 mmol/L 108  104  103   CO2 22 - 32 mmol/L 19  23  22    Calcium  8.9 - 10.3 mg/dL 9.1  9.5  9.7   Total Protein 6.5 - 8.1 g/dL 6.7  6.4  6.9   Total Bilirubin 0.0 - 1.2 mg/dL 1.1  0.5  0.8   Alkaline Phos 38 - 126 U/L 53  52  69   AST 15 - 41 U/L 29  21  23    ALT 0 - 44 U/L 42  23  29    Lipid Panel     Component Value Date/Time   CHOL 86 (L) 03/01/2023 0940   TRIG 103 03/01/2023 0940   HDL 37 (L) 03/01/2023 0940   CHOLHDL 2.3 03/01/2023 0940   CHOLHDL 8.1  12/02/2019 0648   VLDL UNABLE TO CALCULATE IF TRIGLYCERIDE OVER 400 mg/dL 81/19/1478 2956   LDLCALC 29 03/01/2023 0940   LDLDIRECT 95.5 12/02/2019 0648    CBC    Component Value Date/Time   WBC 9.3 05/22/2023 1256   RBC 4.84 05/22/2023 1256   HGB 13.4 05/22/2023 1256   HGB 13.2 03/01/2023 0940   HCT 40.1 05/22/2023 1256   HCT 41.3 03/01/2023 0940   PLT 345 05/22/2023 1256   PLT 317 03/01/2023 0940   MCV 82.9 05/22/2023 1256   MCV 86 03/01/2023 0940   MCH 27.7 05/22/2023 1256   MCHC 33.4 05/22/2023 1256   RDW 15.1 05/22/2023 1256   RDW 13.9 03/01/2023 0940   LYMPHSABS 2.9 05/22/2023 1256   MONOABS 0.6 05/22/2023 1256  EOSABS 0.3 05/22/2023 1256   BASOSABS 0.1 05/22/2023 1256    ASSESSMENT AND PLAN: 1. Type 2 diabetes mellitus with morbid obesity (HCC) (Primary) Diabetes is at goal.  She is followed by endocrinology.  She will continue her current medications including metformin  extended release 1 g twice a day, Farxiga  5 mg daily, Ozempic  2 mg once a week and Lantus  insulin  100 mg daily.  Encouraged her to continue healthy eating habits and regular exercise. - Ambulatory referral to Ophthalmology  2. Long-term (current) use of injectable non-insulin  antidiabetic drugs 3. Insulin  long-term use (HCC) 4. Diabetes mellitus treated with oral medication (HCC) See #1 above.  5. Chronic insomnia Good sleep hygiene discussed and encouraged. Patient advised not to drink any caffeinated beverages or excessive alcohol use within several hours of bedtime.  Advised to get in bed around about the same time every night.  She should try getting in bed later than 11 PM since it takes her 2 hrs to fall asleep. Once in bed, turn off all lights and sounds.  If unable to fall asleep within 30 to 45 minutes of getting in bed, patient should get up and try to do something until she feels sleepy again.  At that time try getting back in bed.  Patient states she has been doing all of this. Poor sleep  quality remains an issue despite being on Benadryl , melatonin Gummies and high-dose trazodone .  We will try her with Rozerem.  Went over potential side effects.  Advised to stop melatonin and decrease trazodone  to 100 mg at nights once she starts the Rozerem.  We will need to get prior approval for this medication.  - ramelteon (ROZEREM) 8 MG tablet; Take 1 tablet (8 mg total) by mouth at bedtime. Stop Melatonin and decrease Trazodone  to 100 mg at bed time  Dispense: 30 tablet; Refill: 1  6. Hyperlipidemia associated with type 2 diabetes mellitus (HCC) Continue atorvastatin  80 mg daily and Tricor  48 mg daily  7. Hypertension associated with diabetes (HCC) Repeat blood pressure today is still low.  Patient is asymptomatic.  However I recommend decreasing the metoprolol  from 25 mg twice a day to 12.5 mg twice a day  Patient was given the opportunity to ask questions.  Patient verbalized understanding of the plan and was able to repeat key elements of the plan.   This documentation was completed using Paediatric nurse.  Any transcriptional errors are unintentional.  Orders Placed This Encounter  Procedures   Ambulatory referral to Ophthalmology     Requested Prescriptions   Signed Prescriptions Disp Refills   ramelteon (ROZEREM) 8 MG tablet 30 tablet 1    Sig: Take 1 tablet (8 mg total) by mouth at bedtime. Stop Melatonin and decrease Trazodone  to 100 mg at bed time    Return in about 4 months (around 03/02/2024).  Concetta Dee, MD, FACP

## 2023-11-01 NOTE — Patient Instructions (Addendum)
 VISIT SUMMARY:  Today, we discussed your ongoing issues with insomnia, your diabetes management, and your cholesterol levels. We made some adjustments to your medications to help improve your sleep and maintain your overall health.  YOUR PLAN:  -INSOMNIA: Insomnia is a condition where you have trouble falling or staying asleep. We will decrease your trazodone  to 100 mg at bedtime, stop Melatonin and start you on Rozerem 8 mg once daily as needed, to be taken within 30 minutes of bedtime. Rozerem may cause side effects like drowsiness, fatigue, and nausea. We will also submit a prior authorization for Rozerem. Turn off all lights/sounds once you get in bed.   -TYPE 2 DIABETES MELLITUS: Type 2 diabetes is a condition where your body does not use insulin  properly, leading to high blood sugar levels. Your diabetes is well-controlled with an A1c of 6.1. Continue taking metformin  1 g twice daily, Farxiga  5 mg daily, Ozempic  2 mg weekly, and Lantus  insulin  100 units. We will also submit a referral for a diabetic eye exam.  -HYPERLIPIDEMIA: Hyperlipidemia is a condition where you have high levels of fats (lipids) in your blood. Your cholesterol levels are well-controlled with your current medications. Continue taking atorvastatin  80 mg daily and Tricor  40 mg daily.  Hypertension: Your blood pressure is low but you are not having any symptoms from it.  Nonetheless, I recommend decreasing the metoprolol  from 25 mg twice a day to half a tablet twice a day.  Continue the lisinopril  10 mg daily.  Please check your blood pressure at least twice a week for the next 2 weeks.  Goal is 130/80 or lower.  INSTRUCTIONS:  Please follow up with a diabetic eye exam as referred. Continue with your current medications and the new adjustments we discussed today. If you experience any side effects or have any concerns, please contact our office.

## 2023-11-04 ENCOUNTER — Other Ambulatory Visit: Payer: Self-pay | Admitting: Internal Medicine

## 2023-11-04 DIAGNOSIS — G4700 Insomnia, unspecified: Secondary | ICD-10-CM

## 2023-11-04 DIAGNOSIS — E1159 Type 2 diabetes mellitus with other circulatory complications: Secondary | ICD-10-CM

## 2023-11-09 ENCOUNTER — Other Ambulatory Visit: Payer: Self-pay

## 2023-11-10 ENCOUNTER — Telehealth: Payer: Self-pay | Admitting: Internal Medicine

## 2023-11-10 ENCOUNTER — Encounter: Payer: Self-pay | Admitting: Internal Medicine

## 2023-11-10 NOTE — Telephone Encounter (Signed)
-----   Message from Burnard KANDICE Lot sent at 11/09/2023  1:08 PM EDT ----- Regarding: RE: [employer] Insurance covers #15 tablets every 30 days. I will submit a PA today to see if insurance will override for #30 as a 30 day supply. ----- Message ----- From: Vicci Barnie NOVAK, MD Sent: 11/01/2023   1:05 PM EDT To: Burnard KANDICE Lot, CPhT Subject: [employer]                                        Will need PA.

## 2023-12-07 ENCOUNTER — Other Ambulatory Visit: Payer: Self-pay | Admitting: Internal Medicine

## 2023-12-07 DIAGNOSIS — K219 Gastro-esophageal reflux disease without esophagitis: Secondary | ICD-10-CM

## 2023-12-12 ENCOUNTER — Other Ambulatory Visit: Payer: Self-pay | Admitting: Internal Medicine

## 2023-12-12 DIAGNOSIS — E1142 Type 2 diabetes mellitus with diabetic polyneuropathy: Secondary | ICD-10-CM

## 2023-12-15 ENCOUNTER — Encounter: Payer: Self-pay | Admitting: Internal Medicine

## 2023-12-15 ENCOUNTER — Other Ambulatory Visit: Payer: Self-pay | Admitting: Internal Medicine

## 2023-12-15 DIAGNOSIS — E1142 Type 2 diabetes mellitus with diabetic polyneuropathy: Secondary | ICD-10-CM

## 2023-12-16 ENCOUNTER — Telehealth: Payer: Self-pay | Admitting: Internal Medicine

## 2023-12-16 ENCOUNTER — Other Ambulatory Visit: Payer: Self-pay | Admitting: Internal Medicine

## 2023-12-16 DIAGNOSIS — E1142 Type 2 diabetes mellitus with diabetic polyneuropathy: Secondary | ICD-10-CM

## 2023-12-16 MED ORDER — GABAPENTIN 300 MG PO CAPS: ORAL_CAPSULE | ORAL | 6 refills | Status: AC

## 2023-12-16 MED ORDER — INSULIN GLARGINE 100 UNITS/ML SOLOSTAR PEN: 100.0000 [IU] | PEN_INJECTOR | Freq: Every day | SUBCUTANEOUS | 3 refills | Status: DC

## 2023-12-16 NOTE — Telephone Encounter (Signed)
 Copied from CRM [id number]. Topic: Clinical - Prescription Issue >> Dec 16, 2023  3:53 PM Deleta S wrote:  Reason for CRM: patient is calling regards to denied medication Gabapentin  300 mg capsule. The prescription was denied by pcp notified by pharmacy. Please follow up [phone]. Patient is aware of call back time.

## 2023-12-17 NOTE — Telephone Encounter (Signed)
 Requested refills sent to preferred pharmacy. Patient notified via Mychart. No further assistance needed at this time.

## 2023-12-24 ENCOUNTER — Other Ambulatory Visit: Payer: Self-pay | Admitting: Internal Medicine

## 2023-12-24 DIAGNOSIS — F5104 Psychophysiologic insomnia: Secondary | ICD-10-CM

## 2024-01-24 ENCOUNTER — Encounter: Payer: Self-pay | Admitting: Internal Medicine

## 2024-01-24 ENCOUNTER — Ambulatory Visit: Admitting: Internal Medicine

## 2024-01-24 VITALS — BP 118/64 | HR 87 | Ht 61.0 in | Wt 218.2 lb

## 2024-01-24 DIAGNOSIS — E785 Hyperlipidemia, unspecified: Secondary | ICD-10-CM

## 2024-01-24 DIAGNOSIS — E1142 Type 2 diabetes mellitus with diabetic polyneuropathy: Secondary | ICD-10-CM | POA: Diagnosis not present

## 2024-01-24 DIAGNOSIS — Z794 Long term (current) use of insulin: Secondary | ICD-10-CM

## 2024-01-24 DIAGNOSIS — E1169 Type 2 diabetes mellitus with other specified complication: Secondary | ICD-10-CM | POA: Diagnosis not present

## 2024-01-24 DIAGNOSIS — Z7984 Long term (current) use of oral hypoglycemic drugs: Secondary | ICD-10-CM

## 2024-01-24 LAB — POCT GLYCOSYLATED HEMOGLOBIN (HGB A1C): Hemoglobin A1C: 7.3 % — AB (ref 4.0–5.6)

## 2024-01-24 MED ORDER — INSULIN GLARGINE 100 UNITS/ML SOLOSTAR PEN: 120.0000 [IU] | PEN_INJECTOR | Freq: Every day | SUBCUTANEOUS | 3 refills | Status: AC

## 2024-01-24 MED ORDER — TIRZEPATIDE 5 MG/0.5ML ~~LOC~~ SOAJ: 5.0000 mg | SUBCUTANEOUS | 3 refills | Status: DC

## 2024-01-24 MED ORDER — DAPAGLIFLOZIN PROPANEDIOL 5 MG PO TABS: 5.0000 mg | ORAL_TABLET | Freq: Every day | ORAL | 3 refills | Status: AC

## 2024-01-24 MED ORDER — METFORMIN HCL ER 500 MG PO TB24: 2000.0000 mg | ORAL_TABLET | Freq: Every day | ORAL | 3 refills | Status: AC

## 2024-01-24 NOTE — Patient Instructions (Addendum)
 Please continue: - Metformin  ER 1000 mg 2x a day - Farxiga  5 mg before b'fast  You can try again: - Lantus  120 units daily in am   Also, we can try to change from Ozempic  to: - Mounjaro  5 mg weekly  Please return in 3-4 months.

## 2024-01-24 NOTE — Progress Notes (Signed)
 Patient ID: Teresa Camacho, female   DOB: 1966-05-15, 58 y.o.   MRN: [id number]  HPI: Teresa Camacho is a 58 y.o.-year-old female, returning for follow-up for DM2, dx in 2021, insulin -dependent soon post dx., uncontrolled, with complications (PN). Pt. previously saw Dr. Kassie, last visit with me 4 months ago.   She is here with her daughter, who is also my patient.  Interim history: No increased urination, blurry vision, nausea, chest pain.  She previously had diarrhea immediately after she was eating >> much improved. 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  Reviewed HbA1c: Lab Results  Component Value Date   HGBA1C 6.1 (A) 09/22/2023   HGBA1C 6.1 (A) 05/24/2023   HGBA1C 5.6 02/12/2023   HGBA1C 6.4 (A) 10/09/2022   HGBA1C 6.2 (A) 06/10/2022   HGBA1C 6.6 (A) 02/06/2022   HGBA1C 7.3 (A) 10/06/2021   HGBA1C 7.3 (A) 07/30/2021   HGBA1C 8.9 (A) 05/28/2021   HGBA1C 8.0 (A) 03/26/2021   Pt is on a regimen of: - Metformin  ER 1000 >> 500 mg 2x a day, with meals -decreased due to diarrhea >> now back on 1000 mg 2x a day - Farxiga  5 mg daily in am  - Lantus  120 >> 140  >> 100 units daily (thighs) >> 120 units daily - splitting the dose did not help >> 100 units daily - Ozempic  0.25 >> 0.5 >> 1 mg weekly - tried a higher dose - diarrhea, belching (Mylanta did not help) >> 2 mg weekly She was on Lantus  before >> sugars high later in the day. She was previously on Humulin  NPH 150 units daily.  Pt checks her sugars 4x a day and they are:  Previously:  Prev.: - am: 83-158 >> 85-139 >> 90-low 100 >> 75, 81-121, 130, 169  - 2h after b'fast: n/c - before lunch: 55 >> n/c >> 88, 99 >> n/c - 2h after lunch: n/c >> 153 >> n/c - before dinner: n/c >> 70-105 >> 111-139 >> n/c >> 79, 97-137 - 2h after dinner: 144-202 >> 130-150 >> 131-186, 180, 230, 258 - bedtime: n/c - nighttime: n/c Lowest sugar was 56 >> ... 69; she has hypoglycemia awareness at 70.  Highest sugar was 266 >> ...  200s. She developed skin irritation from the freestyle libre CGM.  Glucometer: True Metrix  - no CKD, last BUN/creatinine:  Lab Results  Component Value Date   BUN 11 05/22/2023   BUN 13 03/01/2023   CREATININE 0.62 05/22/2023   CREATININE 0.76 03/01/2023   Lab Results  Component Value Date   MICRALBCREAT 13 09/22/2023   MICRALBCREAT 30 (H) 03/01/2023   MICRALBCREAT 25 03/16/2022   MICRALBCREAT 14 03/13/2021   MICRALBCREAT 231 (H) 12/26/2019  She is on lisinopril  10 mg daily.  - +HL; last set of lipids: Lab Results  Component Value Date   CHOL 86 (L) 03/01/2023   HDL 37 (L) 03/01/2023   LDLCALC 29 03/01/2023   LDLDIRECT 95.5 12/02/2019   TRIG 103 03/01/2023   CHOLHDL 2.3 03/01/2023  She is on Lipitor 80 mg daily and Tricor  48 mg daily.  - last eye exam was 09/03/2022. No DR. Needs a new exam.  - + numbness and tingling in her feet.  She is on Neurontin  300 mg x 2 at bedtime.  Last foot exam 02/12/2023  She has a history of PCOS, HTN, OSA, GERD, fibroids, s/p TAH. She has diarrhea immediately after she eats.  She was in the emergency  room with severe diarrhea 05/22/2023.  ROS: + see HPI  Past Medical History:  Diagnosis Date   Anemia    Blood transfusion without reported diagnosis    Fibroids, submucosal 05/03/2013   GERD (gastroesophageal reflux disease)    Hyperlipidemia    Hypertension    PCOS (polycystic ovarian syndrome)    PONV (postoperative nausea and vomiting)     super tight airway as per pt   Sleep apnea    Type II diabetes mellitus (HCC)    Past Surgical History:  Procedure Laterality Date   ABDOMINAL HYSTERECTOMY N/A 06/13/2013   Procedure: HYSTERECTOMY ABDOMINAL and removal of vaginal skin tag;  Surgeon: Alm JAYSON Cook, MD;  Location: [facility];  Service: Gynecology;  Laterality: N/A;   APPENDECTOMY     BALLOON DILATION N/A 08/27/2020   Procedure: BALLOON DILATION;  Surgeon: Abran Norleen SAILOR, MD;  Location: [facility];  Service: Endoscopy;   Laterality: N/A;   CESAREAN SECTION     3x   CHOLECYSTECTOMY     CHOLECYSTECTOMY  1991   COLONOSCOPY WITH PROPOFOL  N/A 08/27/2020   Procedure: COLONOSCOPY WITH PROPOFOL ;  Surgeon: Abran Norleen SAILOR, MD;  Location: [facility];  Service: Endoscopy;  Laterality: N/A;   ESOPHAGOGASTRODUODENOSCOPY (EGD) WITH PROPOFOL  N/A 08/27/2020   Procedure: ESOPHAGOGASTRODUODENOSCOPY (EGD) WITH PROPOFOL ;  Surgeon: Abran Norleen SAILOR, MD;  Location: [facility];  Service: Endoscopy;  Laterality: N/A;   POLYPECTOMY  08/27/2020   Procedure: POLYPECTOMY;  Surgeon: Abran Norleen SAILOR, MD;  Location: [facility];  Service: Endoscopy;;   Social History   Socioeconomic History   Marital status: Divorced    Spouse name: Not on file   Number of children: 5   Years of education: Not on file   Highest education level: Not on file  Occupational History   Not on file  Tobacco Use   Smoking status: Never   Smokeless tobacco: Never  Vaping Use   Vaping status: Never Used  Substance and Sexual Activity   Alcohol use: No   Drug use: No   Sexual activity: Not Currently  Other Topics Concern   Not on file  Social History Narrative   Not on file   Social Drivers of Health   Financial Resource Strain: Medium Risk (03/01/2023)   Overall Financial Resource Strain (CARDIA)    Difficulty of Paying Living Expenses: Somewhat hard  Food Insecurity: Food Insecurity Present (03/01/2023)   Hunger Vital Sign    Worried About Running Out of Food in the Last Year: Sometimes true    Ran Out of Food in the Last Year: Sometimes true  Transportation Needs: No Transportation Needs (03/01/2023)   PRAPARE - Administrator, Civil Service (Medical): No    Lack of Transportation (Non-Medical): No  Physical Activity: Sufficiently Active (03/01/2023)   Exercise Vital Sign    Days of Exercise per Week: 4 days    Minutes of Exercise per Session: 40 min  Stress: No Stress Concern Present (03/01/2023)   [employer] of  Occupational Health - Occupational Stress Questionnaire    Feeling of Stress : Not at all  Social Connections: Moderately Integrated (03/01/2023)   Social Connection and Isolation Panel    Frequency of Communication with Friends and Family: Three times a week    Frequency of Social Gatherings with Friends and Family: Twice a week    Attends Religious Services: More than 4 times per year    Active Member of Clubs or Organizations: Yes    Attends  Club or Organization Meetings: More than 4 times per year    Marital Status: Divorced  Intimate Partner Violence: Not At Risk (03/01/2023)   Humiliation, Afraid, Rape, and Kick questionnaire    Fear of Current or Ex-Partner: No    Emotionally Abused: No    Physically Abused: No    Sexually Abused: No   Current Outpatient Medications on File Prior to Visit  Medication Sig Dispense Refill   Accu-Chek Softclix Lancets lancets use one lancet to check blood glucose three times a day 100 each 6   albuterol  (VENTOLIN  HFA) 108 (90 Base) MCG/ACT inhaler Inhale 2 puffs into the lungs every 6 (six) hours as needed for wheezing or shortness of breath. 8 g 0   aspirin  EC 81 MG tablet Take 1 tablet (81 mg total) by mouth daily. Swallow whole. (Patient taking differently: Take 81 mg by mouth in the morning. Swallow whole.) 100 tablet 2   atorvastatin  (LIPITOR) 80 MG tablet TAKE 1 TABLET BY MOUTH DAILY 90 tablet 1   blood glucose meter kit and supplies KIT Dispense based on patient and insurance preference. Use up to four times daily as directed. (FOR ICD-9 250.00, 250.01). 1 each 0   Blood Glucose Monitoring Suppl (ACCU-CHEK GUIDE) w/Device KIT use to check blood glucose three times a day 1 kit 0   Cholecalciferol (VITAMIN D-3) 125 MCG (5000 UT) TABS Take 5,000 Units by mouth in the morning.     Continuous Glucose Receiver (FREESTYLE LIBRE 3 READER) DEVI Use as directed to check blood glucose. 1 each 0   Continuous Glucose Sensor (FREESTYLE LIBRE 3 PLUS SENSOR)  MISC Inject 1 Device into the skin continuous. Change every 15 days 6 each 3   dapagliflozin  propanediol (FARXIGA ) 5 MG TABS tablet Take 1 tablet (5 mg total) by mouth daily. 90 tablet 3   diphenoxylate -atropine  (LOMOTIL ) 2.5-0.025 MG tablet Take 1 tablet by mouth 4 (four) times daily as needed for diarrhea or loose stools. (Patient not taking: Reported on 09/22/2023) 12 tablet 0   fenofibrate  (TRICOR ) 48 MG tablet TAKE 1 TABLET BY MOUTH DAILY 90 tablet 1   gabapentin  (NEURONTIN ) 300 MG capsule TAKE THREE CAPSULES BY MOUTH EVERY NIGHT AT BEDTIME 90 capsule 6   glucose blood (ACCU-CHEK GUIDE TEST) test strip Use to check blood sugar 3 times daily. 100 each 6   insulin  glargine (LANTUS ) 100 unit/mL SOPN Inject 100 Units into the skin daily. 90 mL 3   Insulin  Pen Needle (TECHLITE PLUS PEN NEEDLES) 32G X 4 MM MISC Use in the morning and at bedtime 200 each 6   lisinopril  (ZESTRIL ) 10 MG tablet Take 1 tablet (10 mg total) by mouth daily. 90 tablet 3   loratadine  (CLARITIN ) 10 MG tablet Take 1 tablet (10 mg total) by mouth daily. 30 tablet 6   metFORMIN  (GLUCOPHAGE -XR) 500 MG 24 hr tablet Take 4 tablets (2,000 mg total) by mouth daily. 360 tablet 3   metoprolol  tartrate (LOPRESSOR ) 25 MG tablet TAKE 1 TABLET BY MOUTH 2 TIMES A DAY 180 tablet 1   Multiple Vitamins-Minerals (MULTIVITAMIN WITH MINERALS) tablet Take 1 tablet by mouth daily.     ondansetron  (ZOFRAN -ODT) 4 MG disintegrating tablet Take 1 tablet (4 mg total) by mouth every 8 (eight) hours as needed for nausea or vomiting. 10 tablet 0   pantoprazole  (PROTONIX ) 40 MG tablet TAKE 1 TABLET BY MOUTH 2 TIMES A DAY BEFORE A MEAL 180 tablet 2   ramelteon  (ROZEREM ) 8 MG tablet TAKE 1 TABLET  BY MOUTH EVERY NIGHT AT BEDTIME STOP MELATONIN AND DECREASE TRAZODONE  TO 100MG  AT BEDTIME 30 tablet 1   Semaglutide , 2 MG/DOSE, (OZEMPIC , 2 MG/DOSE,) 8 MG/3ML SOPN Inject 2 mg into the skin once a week. 9 mL 3   traZODone  (DESYREL ) 100 MG tablet Take 2 tablets at  bedtime.  Decrease to 1 tablet at bedtime once you receive Belsomra 180 tablet 0   No current facility-administered medications on file prior to visit.   Allergies  Allergen Reactions   Wound Dressing Adhesive Other (See Comments)    Redness/irritation   Codeine Nausea And Vomiting    Pt is able to take percocet & vicodin   Latex Rash    irritation   Family History  Problem Relation Age of Onset   Depression Mother    Diabetes Mother    Arthritis Mother    Heart disease Mother    Learning disabilities Mother    Mental illness Mother    Liver cancer Mother        metastatic from colon??   Arthritis Father    Prostate cancer Father        met to esophagus   Colon polyps Sister    Cancer Maternal Aunt        x 2 Aunts   Breast cancer Maternal Grandmother    PE: BP 118/64   Pulse 87   Ht 5' 1 (1.549 m)   Wt 218 lb 3.2 oz (99 kg)   LMP 03/13/2013   SpO2 95%   BMI 41.23 kg/m  Wt Readings from Last 3 Encounters:  01/24/24 218 lb 3.2 oz (99 kg)  11/01/23 217 lb (98.4 kg)  09/22/23 218 lb 3.2 oz (99 kg)   Constitutional: overweight, in NAD Eyes:  EOMI, no exophthalmos ENT: no neck masses, no cervical lymphadenopathy Cardiovascular: RRR, No MRG Respiratory: CTA B Musculoskeletal: no deformities Skin:no rashes Neurological: no tremor with outstretched hands Diabetic Foot Exam - Simple   Simple Foot Form Diabetic Foot exam was performed with the following findings: Yes 01/24/2024  9:32 AM  Visual Inspection No deformities, no ulcerations, no other skin breakdown bilaterally: Yes Sensation Testing Intact to touch and monofilament testing bilaterally: Yes Pulse Check Posterior Tibialis and Dorsalis pulse intact bilaterally: Yes Comments    ASSESSMENT: 1. DM2, insulin -dependent, uncontrolled, with complications - PN  2. HL  PLAN:  1. Patient with longstanding, uncontrolled, type 2 diabetes, on oral antidiabetic regimen with metformin  and SGLT2 inhibitor, along  with injectable long-acting daily insulin  and weekly GLP-1 receptor agonist, with improving control.  At last visit, HbA1c was stable, at goal, at 6.1%.  Sugars were almost entirely at goal, fluctuating within the relatively narrow range within the target interval, with slightly higher blood sugars after dinner.  Sugars were improving overnight so we had room to decrease her insulin  dose but we continued the rest of the regimen. - She previously inquired about switching from Ozempic  to Mounjaro  but as she had an exaggerated gastrocolic reflex, I did not recommend it.  I did recommend to see GI for this.  We initially decreased metformin  but she is back to the full dose, without diarrhea now. CGM interpretation: -At today's visit, we reviewed her CGM downloads: It appears that 70% of values are in target range (goal >70%), while 30% are higher than 180 (goal <25%), and 0% are lower than 70 (goal <4%).  The calculated average blood sugar is 165.  The projected HbA1c for the next 3 months (GMI)  is 7.3%. -Reviewing the CGM trends, sugars are worse compared to last OV, more stable overnight, but increasing after b'fast and fluctuating around the ULN after this meal, decreasing only after 12 am.  She feels that her sugars increased ever since we tried to reduce the dose of Lantus  at last visit.  She did not change her diet since last visit.  She does still have meals with more carbs (for example sandwich + grapes +2 cookies) and sugars may increase to the mid 200s after such meals.  However, she mentions that this is not a change from her previous meals and the sugars were better controlled on the higher Lantus  dose.  Since she did not have recurrent lows on the higher dose of Lantus , we discussed that we could try to go back to the previous dose.  She is also interested in trying to switch from Ozempic  to Mounjaro .  I sent the 5 mg dose to her pharmacy.  Will continue the rest of the regimen.  Her prescriptions were  refilled today. - I suggested to:  Patient Instructions  Please continue: - Metformin  ER 1000 mg 2x a day - Farxiga  5 mg before b'fast  You can try again: - Lantus  120 units daily in am   Also, we can try to change from Ozempic  to: - Mounjaro  5 mg weekly  Please return in 3-4 months.   - we checked her HbA1c: 7.3% (higher) - advised to check sugars at different times of the day - 4x a day, rotating check times - advised for yearly eye exams >> she is not UTD-she is looking for a new office - return to clinic in 3-4 months  2. HL - Latest lipid panel was reviewed from 02/2023: HDL slightly low, otherwise  fractions at goal: Lab Results  Component Value Date   CHOL 86 (L) 03/01/2023   HDL 37 (L) 03/01/2023   LDLCALC 29 03/01/2023   LDLDIRECT 95.5 12/02/2019   TRIG 103 03/01/2023   CHOLHDL 2.3 03/01/2023  -She continues on Lipitor 80 mg daily and Tricor  48 mg daily with good tolerance.  Teresa Fendt, MD PhD [facility]

## 2024-02-14 ENCOUNTER — Other Ambulatory Visit: Payer: Self-pay | Admitting: Internal Medicine

## 2024-02-14 DIAGNOSIS — Z1231 Encounter for screening mammogram for malignant neoplasm of breast: Secondary | ICD-10-CM

## 2024-02-18 ENCOUNTER — Other Ambulatory Visit: Payer: Self-pay | Admitting: Internal Medicine

## 2024-02-18 DIAGNOSIS — G4700 Insomnia, unspecified: Secondary | ICD-10-CM

## 2024-03-01 ENCOUNTER — Ambulatory Visit
Admission: RE | Admit: 2024-03-01 | Discharge: 2024-03-01 | Disposition: A | Discharge status: Home / Self Care | Source: Ambulatory Visit | Attending: Internal Medicine | Admitting: Internal Medicine

## 2024-03-01 ENCOUNTER — Other Ambulatory Visit: Payer: Self-pay | Admitting: Family Medicine

## 2024-03-01 DIAGNOSIS — Z1231 Encounter for screening mammogram for malignant neoplasm of breast: Secondary | ICD-10-CM

## 2024-03-03 ENCOUNTER — Other Ambulatory Visit: Payer: Self-pay | Admitting: Internal Medicine

## 2024-03-03 ENCOUNTER — Ambulatory Visit: Admitting: Internal Medicine

## 2024-03-03 DIAGNOSIS — J302 Other seasonal allergic rhinitis: Secondary | ICD-10-CM

## 2024-03-08 ENCOUNTER — Encounter: Payer: Self-pay | Admitting: Internal Medicine

## 2024-03-08 DIAGNOSIS — E1142 Type 2 diabetes mellitus with diabetic polyneuropathy: Secondary | ICD-10-CM

## 2024-03-09 MED ORDER — TECHLITE PLUS PEN NEEDLES 32G X 4 MM MISC: 1.0000 | Freq: Three times a day (TID) | 6 refills | Status: AC

## 2024-03-12 ENCOUNTER — Other Ambulatory Visit: Payer: Self-pay | Admitting: Internal Medicine

## 2024-03-12 DIAGNOSIS — E1169 Type 2 diabetes mellitus with other specified complication: Secondary | ICD-10-CM

## 2024-03-14 ENCOUNTER — Encounter: Payer: Self-pay | Admitting: Internal Medicine

## 2024-03-14 ENCOUNTER — Other Ambulatory Visit (HOSPITAL_COMMUNITY): Payer: Self-pay

## 2024-03-14 ENCOUNTER — Telehealth: Payer: Self-pay

## 2024-03-14 ENCOUNTER — Other Ambulatory Visit: Payer: Self-pay | Admitting: Internal Medicine

## 2024-03-14 NOTE — Telephone Encounter (Signed)
 Requested medications are due for refill today.  yes  Requested medications are on the active medications list.  yes  Last refill. 09/17/2023 #90 1 rf  Future visit scheduled.   no  Notes to clinic.  Labs are expired.    Requested Prescriptions  Pending Prescriptions Disp Refills   atorvastatin  (LIPITOR) 80 MG tablet [Pharmacy Med Name: ATORVASTATIN  80 MG TABLET] 90 tablet 1    Sig: TAKE 1 TABLET BY MOUTH DAILY     Cardiovascular:  Antilipid - Statins Failed - 03/14/2024 12:25 PM      Failed - Lipid Panel in normal range within the last 12 months    Cholesterol, Total  Date Value Ref Range Status  03/01/2023 86 (L) 100 - 199 mg/dL Final   LDL Chol Calc (NIH)  Date Value Ref Range Status  03/01/2023 29 0 - 99 mg/dL Final   Direct LDL  Date Value Ref Range Status  12/02/2019 95.5 0 - 99 mg/dL Final    Comment:    Performed at [facility], [address] [place], [place] [address]   HDL  Date Value Ref Range Status  03/01/2023 37 (L) >39 mg/dL Final   Triglycerides  Date Value Ref Range Status  03/01/2023 103 0 - 149 mg/dL Final         Passed - Patient is not pregnant      Passed - Valid encounter within last 12 months    Recent Outpatient Visits           4 months ago Type 2 diabetes mellitus with morbid obesity (HCC)   [facility] [place] Vicci Sober B, MD   8 months ago Type 2 diabetes mellitus with morbid obesity [facility])   [facility] Dept Of [place] Vicci Sober B, MD   1 year ago Type 2 diabetes mellitus with morbid obesity [facility])   [facility] - [facility] Dept Of [place]. [facility] Vicci Sober B, MD   1 year ago Type 2 diabetes mellitus with morbid obesity [facility])   [facility] Dept Of [place]. [facility] Vicci Sober NOVAK, MD   1 year ago Benign paroxysmal positional vertigo,  unspecified laterality   [facility] - A Dept Of [place]. [facility] Vicci Sober NOVAK, MD

## 2024-03-14 NOTE — Telephone Encounter (Addendum)
 Pharmacy Patient Advocate Encounter   Received notification from Pt Calls Messages that prior authorization for Mounjaro  5mg /0.59ml is required/requested.   Insurance verification completed.   The patient is insured through [facility].   Per test claim: PA required; PA submitted to above mentioned insurance via Latent Key/confirmation #/EOC [id number] Status is pending

## 2024-03-22 NOTE — Telephone Encounter (Signed)
 PA was denied   Here is the reason: We denied your request for:  Mounjaro  Inj 5mg /0.5 Policy rules found at Clinical Coverage Policy 9, Outpatient Pharmacy Program guided our decision. Here are the policy requirements your request did not meet: Per your health plan's criteria, this drug is covered if you meet the following: If the request is for a non-preferred drug, you have tried one preferred drug (or your doctor provides clinical reason why you cannot use the drugs): Trulicity and Victoza. The information provided does not show that you meet the criteria listed above. Please speak with your doctor about your choices. This decision was made per the [facility] of [place]  GLP-1 Receptor Agonists and Combinations Guideline. *Please note: The drug(s) listed above may require additional review

## 2024-03-22 NOTE — Telephone Encounter (Signed)
 J, please let her know that her insurance does not approve Mounjaro  unless she tried Victoza (once a day) or Trulicity (once a week).  In that case, my suggestion would to be to stay on Ozempic , rather than going to these, as these would not be as strong as Ozempic .  However, if she absolutely wants to try these, let me know.

## 2024-03-30 ENCOUNTER — Other Ambulatory Visit: Payer: Self-pay | Admitting: Internal Medicine

## 2024-03-30 DIAGNOSIS — E1169 Type 2 diabetes mellitus with other specified complication: Secondary | ICD-10-CM

## 2024-05-05 ENCOUNTER — Other Ambulatory Visit: Payer: Self-pay | Admitting: Internal Medicine

## 2024-05-05 DIAGNOSIS — E1159 Type 2 diabetes mellitus with other circulatory complications: Secondary | ICD-10-CM

## 2024-05-08 ENCOUNTER — Other Ambulatory Visit: Payer: Self-pay | Admitting: Internal Medicine

## 2024-05-08 DIAGNOSIS — I152 Hypertension secondary to endocrine disorders: Secondary | ICD-10-CM

## 2024-05-26 ENCOUNTER — Ambulatory Visit: Admitting: Internal Medicine

## 2024-05-26 NOTE — Progress Notes (Unsigned)
 Patient ID: Teresa Camacho, female   DOB: 12/16/1965, 59 y.o.   MRN: [id number]  HPI: Teresa Camacho is a 59 y.o.-year-old female, returning for follow-up for DM2, dx in 2021, insulin -dependent soon post dx., uncontrolled, with complications (PN). Pt. previously saw Dr. Kassie, last visit with me 58 months ago.   She is here with her daughter, who is also my patient.  Interim history: No increased urination, blurry vision, nausea, chest pain.  She previously had diarrhea immediately after she was eating >> but this improved. 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  Reviewed HbA1c: Lab Results  Component Value Date   HGBA1C 7.3 (A) 01/24/2024   HGBA1C 6.1 (A) 09/22/2023   HGBA1C 6.1 (A) 05/24/2023   HGBA1C 5.6 02/12/2023   HGBA1C 6.4 (A) 10/09/2022   HGBA1C 6.2 (A) 06/10/2022   HGBA1C 6.6 (A) 02/06/2022   HGBA1C 7.3 (A) 10/06/2021   HGBA1C 7.3 (A) 07/30/2021   HGBA1C 8.9 (A) 05/28/2021   Pt is on a regimen of: - Metformin  ER 1000 >> 500 mg 2x a day, with meals -decreased due to diarrhea >> back on 1000 mg 2x a day - Farxiga  5 mg daily in am  - Lantus  120 >> 140  >> 100 units daily (thighs) >> 120 units daily - splitting the dose did not help >> 100 >> 120 units daily - Ozempic  0.25 >> 0.5 >> 1 mg weekly - tried a higher dose - diarrhea, belching (Mylanta did not help) >> 2 mg weekly She was on Lantus  before >> sugars high later in the day. She was previously on Humulin  NPH 150 units daily. Mounjaro  was not approved for her in 01/2024 unless she tried and failed Trulicity and Victoza.  Pt checks her sugars 4x a day and they are:  Previously:  Previously:   Lowest sugar was 56 >> ... 69; she has hypoglycemia awareness at 70.  Highest sugar was 266 >> ... 200s. She developed skin irritation from the freestyle libre CGM.  Glucometer: True Metrix  - no CKD, last BUN/creatinine:  Lab Results  Component Value Date   BUN 11 05/22/2023   BUN 13 03/01/2023   CREATININE  0.62 05/22/2023   CREATININE 0.76 03/01/2023   Lab Results  Component Value Date   MICRALBCREAT 13 09/22/2023   MICRALBCREAT 30 (H) 03/01/2023   MICRALBCREAT 25 03/16/2022   MICRALBCREAT 14 03/13/2021   MICRALBCREAT 231 (H) 12/26/2019  She is on lisinopril  10 mg daily.  - +HL; last set of lipids: Lab Results  Component Value Date   CHOL 86 (L) 03/01/2023   HDL 37 (L) 03/01/2023   LDLCALC 29 03/01/2023   LDLDIRECT 95.5 12/02/2019   TRIG 103 03/01/2023   CHOLHDL 2.3 03/01/2023  She is on Lipitor 80 mg daily and Tricor  48 mg daily.  - last eye exam was 09/03/2022. No DR. Needs a new exam.  - + numbness and tingling in her feet.  She is on Neurontin  300 mg x 2 at bedtime.  Last foot exam 01/24/2024.  She has a history of PCOS, HTN, OSA, GERD, fibroids, s/p TAH. She has diarrhea immediately after she eats.  She was in the emergency room with severe diarrhea 05/22/2023.  ROS: + see HPI  Past Medical History:  Diagnosis Date   Anemia    Blood transfusion without reported diagnosis    Fibroids, submucosal 05/03/2013   GERD (gastroesophageal reflux disease)    Hyperlipidemia    Hypertension  PCOS (polycystic ovarian syndrome)    PONV (postoperative nausea and vomiting)     super tight airway as per pt   Sleep apnea    Type II diabetes mellitus (HCC)    Past Surgical History:  Procedure Laterality Date   ABDOMINAL HYSTERECTOMY N/A 06/13/2013   Procedure: HYSTERECTOMY ABDOMINAL and removal of vaginal skin tag;  Surgeon: Alm JAYSON Cook, MD;  Location: [facility];  Service: Gynecology;  Laterality: N/A;   APPENDECTOMY     BALLOON DILATION N/A 08/27/2020   Procedure: BALLOON DILATION;  Surgeon: Abran Norleen SAILOR, MD;  Location: [facility];  Service: Endoscopy;  Laterality: N/A;   CESAREAN SECTION     3x   CHOLECYSTECTOMY     CHOLECYSTECTOMY  1991   COLONOSCOPY WITH PROPOFOL  N/A 08/27/2020   Procedure: COLONOSCOPY WITH PROPOFOL ;  Surgeon: Abran Norleen SAILOR, MD;  Location: [facility];   Service: Endoscopy;  Laterality: N/A;   ESOPHAGOGASTRODUODENOSCOPY (EGD) WITH PROPOFOL  N/A 08/27/2020   Procedure: ESOPHAGOGASTRODUODENOSCOPY (EGD) WITH PROPOFOL ;  Surgeon: Abran Norleen SAILOR, MD;  Location: [facility];  Service: Endoscopy;  Laterality: N/A;   POLYPECTOMY  08/27/2020   Procedure: POLYPECTOMY;  Surgeon: Abran Norleen SAILOR, MD;  Location: [facility];  Service: Endoscopy;;   Social History   Socioeconomic History   Marital status: Divorced    Spouse name: Not on file   Number of children: 5   Years of education: Not on file   Highest education level: Not on file  Occupational History   Not on file  Tobacco Use   Smoking status: Never   Smokeless tobacco: Never  Vaping Use   Vaping status: Never Used  Substance and Sexual Activity   Alcohol use: No   Drug use: No   Sexual activity: Not Currently  Other Topics Concern   Not on file  Social History Narrative   Not on file   Social Drivers of Health   Tobacco Use: Low Risk (01/24/2024)   Patient History    Smoking Tobacco Use: Never    Smokeless Tobacco Use: Never    Passive Exposure: Not on file  Financial Resource Strain: Medium Risk (03/01/2023)   Overall Financial Resource Strain (CARDIA)    Difficulty of Paying Living Expenses: Somewhat hard  Food Insecurity: Food Insecurity Present (03/01/2023)   Hunger Vital Sign    Worried About Running Out of Food in the Last Year: Sometimes true    Ran Out of Food in the Last Year: Sometimes true  Transportation Needs: No Transportation Needs (03/01/2023)   PRAPARE - Administrator, Civil Service (Medical): No    Lack of Transportation (Non-Medical): No  Physical Activity: Sufficiently Active (03/01/2023)   Exercise Vital Sign    Days of Exercise per Week: 4 days    Minutes of Exercise per Session: 40 min  Stress: No Stress Concern Present (03/01/2023)   [employer] of Occupational Health - Occupational Stress Questionnaire    Feeling of Stress : Not at  all  Social Connections: Moderately Integrated (03/01/2023)   Social Connection and Isolation Panel    Frequency of Communication with Friends and Family: Three times a week    Frequency of Social Gatherings with Friends and Family: Twice a week    Attends Religious Services: More than 4 times per year    Active Member of [employer] or Organizations: Yes    Attends Engineer, Structural: More than 4 times per year    Marital Status: Divorced  Catering Manager  Violence: Not At Risk (03/01/2023)   Humiliation, Afraid, Rape, and Kick questionnaire    Fear of Current or Ex-Partner: No    Emotionally Abused: No    Physically Abused: No    Sexually Abused: No  Depression (PHQ2-9): Medium Risk (11/01/2023)   Depression (PHQ2-9)    PHQ-2 Score: 5  Alcohol Screen: Low Risk (03/01/2023)   Alcohol Screen    Last Alcohol Screening Score (AUDIT): 0  Housing: Low Risk (03/01/2023)   Housing    Last Housing Risk Score: 0  Utilities: At Risk (03/01/2023)   AHC Utilities    Threatened with loss of utilities: Yes  Health Literacy: Adequate Health Literacy (03/01/2023)   B1300 Health Literacy    Frequency of need for help with medical instructions: Never   Current Outpatient Medications on File Prior to Visit  Medication Sig Dispense Refill   Accu-Chek Softclix Lancets lancets use one lancet to check blood glucose three times a day 100 each 6   aspirin  EC 81 MG tablet Take 1 tablet (81 mg total) by mouth daily. Swallow whole. (Patient taking differently: Take 81 mg by mouth in the morning. Swallow whole.) 100 tablet 2   atorvastatin  (LIPITOR) 80 MG tablet TAKE 1 TABLET BY MOUTH DAILY 90 tablet 0   blood glucose meter kit and supplies KIT Dispense based on patient and insurance preference. Use up to four times daily as directed. (FOR ICD-9 250.00, 250.01). 1 each 0   Blood Glucose Monitoring Suppl (ACCU-CHEK GUIDE) w/Device KIT use to check blood glucose three times a day 1 kit 0    Cholecalciferol (VITAMIN D-3) 125 MCG (5000 UT) TABS Take 5,000 Units by mouth in the morning.     Continuous Glucose Sensor (FREESTYLE LIBRE 3 PLUS SENSOR) MISC Inject 1 Device into the skin continuous. Change every 15 days 6 each 3   dapagliflozin  propanediol (FARXIGA ) 5 MG TABS tablet Take 1 tablet (5 mg total) by mouth daily. 90 tablet 3   diphenoxylate -atropine  (LOMOTIL ) 2.5-0.025 MG tablet Take 1 tablet by mouth 4 (four) times daily as needed for diarrhea or loose stools. 12 tablet 0   fenofibrate  (TRICOR ) 48 MG tablet TAKE 1 TABLET BY MOUTH DAILY 90 tablet 1   gabapentin  (NEURONTIN ) 300 MG capsule TAKE THREE CAPSULES BY MOUTH EVERY NIGHT AT BEDTIME 90 capsule 6   glucose blood (ACCU-CHEK GUIDE TEST) test strip Use to check blood sugar 3 times daily. 100 each 6   insulin  glargine (LANTUS ) 100 unit/mL SOPN Inject 120 Units into the skin daily. 90 mL 3   Insulin  Pen Needle (TECHLITE PLUS PEN NEEDLES) 32G X 4 MM MISC 1 each by Does not apply route 3 (three) times daily. 300 each 6   lisinopril  (ZESTRIL ) 10 MG tablet Take 1 tablet (10 mg total) by mouth daily. 90 tablet 3   loratadine  (CLARITIN ) 10 MG tablet TAKE 1 TABLET BY MOUTH DAILY 30 tablet 6   metFORMIN  (GLUCOPHAGE -XR) 500 MG 24 hr tablet Take 4 tablets (2,000 mg total) by mouth daily. 360 tablet 3   metoprolol  tartrate (LOPRESSOR ) 25 MG tablet TAKE 1 TABLET BY MOUTH 2 TIMES A DAY 180 tablet 1   Multiple Vitamins-Minerals (MULTIVITAMIN WITH MINERALS) tablet Take 1 tablet by mouth daily.     ondansetron  (ZOFRAN -ODT) 4 MG disintegrating tablet Take 1 tablet (4 mg total) by mouth every 8 (eight) hours as needed for nausea or vomiting. 10 tablet 0   pantoprazole  (PROTONIX ) 40 MG tablet TAKE 1 TABLET BY MOUTH 2 TIMES  A DAY BEFORE A MEAL 180 tablet 2   ramelteon  (ROZEREM ) 8 MG tablet TAKE 1 TABLET BY MOUTH EVERY NIGHT AT BEDTIME STOP MELATONIN AND DECREASE TRAZODONE  TO 100MG  AT BEDTIME 30 tablet 1   Semaglutide , 2 MG/DOSE, (OZEMPIC , 2 MG/DOSE,) 8  MG/3ML SOPN INJECT 2MG  UNDER THE SKIN ONCE WEEKLY 6 mL 2   tirzepatide  (MOUNJARO ) 5 MG/0.5ML Pen Inject 5 mg into the skin once a week. 2 mL 3   traZODone  (DESYREL ) 100 MG tablet TAKE 2 TABLETS BY MOUTH AT BEDTIME 68 tablet 0   No current facility-administered medications on file prior to visit.   Allergies  Allergen Reactions   Wound Dressing Adhesive Other (See Comments)    Redness/irritation   Codeine Nausea And Vomiting    Pt is able to take percocet & vicodin   Latex Rash    irritation   Family History  Problem Relation Age of Onset   Depression Mother    Diabetes Mother    Arthritis Mother    Heart disease Mother    Learning disabilities Mother    Mental illness Mother    Liver cancer Mother        metastatic from colon??   Arthritis Father    Prostate cancer Father        met to esophagus   Colon polyps Sister    Cancer Maternal Aunt        x 2 Aunts   Breast cancer Maternal Grandmother    PE: LMP 03/13/2013  Wt Readings from Last 3 Encounters:  01/24/24 218 lb 3.2 oz (99 kg)  11/01/23 217 lb (98.4 kg)  09/22/23 218 lb 3.2 oz (99 kg)   Constitutional: overweight, in NAD Eyes:  EOMI, no exophthalmos ENT: no neck masses, no cervical lymphadenopathy Cardiovascular: RRR, No MRG Respiratory: CTA B Musculoskeletal: no deformities Skin:no rashes Neurological: no tremor with outstretched hands  ASSESSMENT: 1. DM2, insulin -dependent, uncontrolled, with complications - PN  2. HL  PLAN:  1. Patient with longstanding, uncontrolled, type 2 diabetes, on oral antidiabetic regimen with metformin  and SGLT2 inhibitor along with long-acting insulin  and weekly GLP-1 receptor agonist.  She previously inquired about switching from Ozempic  to Mounjaro  but this was not approved by her insurance unless she tried Victoza and Trulicity.  In that case, I advised her to remain on Ozempic .  Of note, she has a history of an exaggerated gastrocolic reflex and I did recommend to see GI  for this.  We initially decreased metformin  but her diarrhea improved and she increased it back to the full dose. - At last visit, sugars were worse, more stable overnight but increasing after breakfast and fluctuating around the upper limit of normal after this meal, decreasing only after 12 AM.  We discussed about the need to change her diet as she was eating quite a lot of carbs but we also increase her Lantus  dose.  We tried to switch from Ozempic  to Mounjaro  but, as mentioned above, she ended up not being able to start this.  I refilled her prescriptions at last visit. CGM interpretation: -At today's visit, we reviewed her CGM downloads: It appears that *** of values are in target range (goal >70%), while *** are higher than 180 (goal <25%), and *** are lower than 70 (goal <4%).  The calculated average blood sugar is ***.  The projected HbA1c for the next 3 months (GMI) is ***. -Reviewing the CGM trends, *** - I suggested to:  Patient Instructions  Please continue: -  Metformin  ER 1000 mg 2x a day - Farxiga  5 mg before b'fast - Lantus  120 units daily in am  - Ozempic  2 mg weekly  Please return in 3-4 months.   - we checked her HbA1c: 7%  - advised to check sugars at different times of the day - 4x a day, rotating check times - advised for yearly eye exams >> she is UTD - return to clinic in 3-4 months  2. HL - Latest lipid panel was reviewed from 02/2022: Fractions at goal except for low HDL: Lab Results  Component Value Date   CHOL 86 (L) 03/01/2023   HDL 37 (L) 03/01/2023   LDLCALC 29 03/01/2023   LDLDIRECT 95.5 12/02/2019   TRIG 103 03/01/2023   CHOLHDL 2.3 03/01/2023  -She is on Lipitor 80 mg daily and Tricor  48 mg daily with good tolerance  Lela Fendt, MD PhD [facility]

## 2024-06-09 ENCOUNTER — Other Ambulatory Visit: Payer: Self-pay

## 2024-06-09 ENCOUNTER — Telehealth: Payer: Self-pay

## 2024-06-09 NOTE — Telephone Encounter (Signed)
 Pharmacy Patient Advocate Encounter  Received notification from [facility] MEDICAID that Prior Authorization for OZEMPIC  has been APPROVED from 06/09/2024 to 06/09/2025   PA #/Case ID/Reference #: [id number]

## 2024-06-15 ENCOUNTER — Encounter: Payer: Self-pay | Admitting: Internal Medicine

## 2024-06-15 ENCOUNTER — Telehealth: Admitting: Internal Medicine

## 2024-06-15 DIAGNOSIS — E1142 Type 2 diabetes mellitus with diabetic polyneuropathy: Secondary | ICD-10-CM | POA: Diagnosis not present

## 2024-06-15 DIAGNOSIS — Z7985 Long-term (current) use of injectable non-insulin antidiabetic drugs: Secondary | ICD-10-CM | POA: Diagnosis not present

## 2024-06-15 DIAGNOSIS — E785 Hyperlipidemia, unspecified: Secondary | ICD-10-CM | POA: Diagnosis not present

## 2024-06-15 DIAGNOSIS — E1169 Type 2 diabetes mellitus with other specified complication: Secondary | ICD-10-CM

## 2024-06-15 MED ORDER — TIRZEPATIDE 5 MG/0.5ML ~~LOC~~ SOAJ: 5.0000 mg | SUBCUTANEOUS | 3 refills | Status: AC

## 2024-06-15 NOTE — Progress Notes (Signed)
 Patient ID: Teresa Camacho, female   DOB: 21-Apr-1966, 59 y.o.   MRN: [id number] This note was precharted 05/26/2024.  Patient location: Home My location: Office Persons participating in the virtual visit: patient, provider  Referring Provider: Vicci Barnie NOVAK, MD  I connected with the patient on 06/15/24 at  9:51 AM EST by a video enabled telemedicine application and verified that I am speaking with the correct person.   I discussed the limitations of evaluation and management by telemedicine and the availability of in person appointments. The patient expressed understanding and agreed to proceed.   Details of the encounter are shown below.  HPI: Teresa Camacho is a 59 y.o.-year-old female, returning for follow-up for DM2, dx in 2021, insulin -dependent soon post dx., uncontrolled, with complications (PN). Pt. previously saw Dr. Kassie, last visit with me 4.5 months ago.   She is here with her daughter, who is also my patient.  Interim history: No increased urination, blurry vision, nausea, chest pain. She had diarrhea >> resolved. 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 She had weight gain - ~4-5 lbs since last OV.  Reviewed HbA1c: Lab Results  Component Value Date   HGBA1C 7.3 (A) 01/24/2024   HGBA1C 6.1 (A) 09/22/2023   HGBA1C 6.1 (A) 05/24/2023   HGBA1C 5.6 02/12/2023   HGBA1C 6.4 (A) 10/09/2022   HGBA1C 6.2 (A) 06/10/2022   HGBA1C 6.6 (A) 02/06/2022   HGBA1C 7.3 (A) 10/06/2021   HGBA1C 7.3 (A) 07/30/2021   HGBA1C 8.9 (A) 05/28/2021   Pt is on a regimen of: - Metformin  ER 1000 >> 500 mg 2x a day, with meals -decreased due to diarrhea >> back on 1000 mg 2x a day - Farxiga  5 mg daily in am  - Lantus  120 >> 140  >> 100 units daily (thighs) >> 120 units daily - splitting the dose did not help >> 100 >> 120 units daily - Ozempic  0.25 >> 0.5 >> 1 mg weekly - tried a higher dose - diarrhea, belching (Mylanta did not help) >> 2 mg weekly >> Mounjaro  [place] approved. She was  on Lantus  before >> sugars high later in the day. She was previously on Humulin  NPH 150 units daily. Mounjaro  was not approved for her in 01/2024 unless she tried and failed Trulicity and Victoza.  Pt checks her sugars 4x a day and they are:  Previously:  Previously:   Lowest sugar was 56 >> ... 69; she has hypoglycemia awareness at 70.  Highest sugar was 266 >> ... 200s. She developed skin irritation from the freestyle libre CGM.  Glucometer: True Metrix  - no CKD, last BUN/creatinine:  Lab Results  Component Value Date   BUN 11 05/22/2023   BUN 13 03/01/2023   CREATININE 0.62 05/22/2023   CREATININE 0.76 03/01/2023   Lab Results  Component Value Date   MICRALBCREAT 13 09/22/2023   MICRALBCREAT 30 (H) 03/01/2023   MICRALBCREAT 25 03/16/2022   MICRALBCREAT 14 03/13/2021   MICRALBCREAT 231 (H) 12/26/2019  She is on lisinopril  10 mg daily.  - + HL; last set of lipids: Lab Results  Component Value Date   CHOL 86 (L) 03/01/2023   HDL 37 (L) 03/01/2023   LDLCALC 29 03/01/2023   LDLDIRECT 95.5 12/02/2019   TRIG 103 03/01/2023   CHOLHDL 2.3 03/01/2023  She is on Lipitor 80 mg daily and Tricor  48 mg daily.  - last eye exam was 09/03/2022. No DR. Needs a new exam.  - +  numbness and tingling in her feet.  She is on Neurontin  300 mg x 2 at bedtime.  Last foot exam 01/24/2024.  She has a history of PCOS, HTN, OSA, GERD, fibroids, s/p TAH. She has diarrhea immediately after she eats.  She was in the emergency room with severe diarrhea 05/22/2023.  ROS: + see HPI  Past Medical History:  Diagnosis Date   Anemia    Blood transfusion without reported diagnosis    Fibroids, submucosal 05/03/2013   GERD (gastroesophageal reflux disease)    Hyperlipidemia    Hypertension    PCOS (polycystic ovarian syndrome)    PONV (postoperative nausea and vomiting)     super tight airway as per pt   Sleep apnea    Type II diabetes mellitus (HCC)    Past Surgical History:  Procedure  Laterality Date   ABDOMINAL HYSTERECTOMY N/A 06/13/2013   Procedure: HYSTERECTOMY ABDOMINAL and removal of vaginal skin tag;  Surgeon: Alm JAYSON Cook, MD;  Location: [facility];  Service: Gynecology;  Laterality: N/A;   APPENDECTOMY     BALLOON DILATION N/A 08/27/2020   Procedure: BALLOON DILATION;  Surgeon: Abran Norleen SAILOR, MD;  Location: [facility];  Service: Endoscopy;  Laterality: N/A;   CESAREAN SECTION     3x   CHOLECYSTECTOMY     CHOLECYSTECTOMY  1991   COLONOSCOPY WITH PROPOFOL  N/A 08/27/2020   Procedure: COLONOSCOPY WITH PROPOFOL ;  Surgeon: Abran Norleen SAILOR, MD;  Location: [facility];  Service: Endoscopy;  Laterality: N/A;   ESOPHAGOGASTRODUODENOSCOPY (EGD) WITH PROPOFOL  N/A 08/27/2020   Procedure: ESOPHAGOGASTRODUODENOSCOPY (EGD) WITH PROPOFOL ;  Surgeon: Abran Norleen SAILOR, MD;  Location: [facility];  Service: Endoscopy;  Laterality: N/A;   POLYPECTOMY  08/27/2020   Procedure: POLYPECTOMY;  Surgeon: Abran Norleen SAILOR, MD;  Location: [facility];  Service: Endoscopy;;   Social History   Socioeconomic History   Marital status: Divorced    Spouse name: Not on file   Number of children: 5   Years of education: Not on file   Highest education level: Not on file  Occupational History   Not on file  Tobacco Use   Smoking status: Never   Smokeless tobacco: Never  Vaping Use   Vaping status: Never Used  Substance and Sexual Activity   Alcohol use: No   Drug use: No   Sexual activity: Not Currently  Other Topics Concern   Not on file  Social History Narrative   Not on file   Social Drivers of Health   Tobacco Use: Low Risk (01/24/2024)   Patient History    Smoking Tobacco Use: Never    Smokeless Tobacco Use: Never    Passive Exposure: Not on file  Financial Resource Strain: Medium Risk (03/01/2023)   Overall Financial Resource Strain (CARDIA)    Difficulty of Paying Living Expenses: Somewhat hard  Food Insecurity: Food Insecurity Present (03/01/2023)   Hunger Vital Sign    Worried  About Running Out of Food in the Last Year: Sometimes true    Ran Out of Food in the Last Year: Sometimes true  Transportation Needs: No Transportation Needs (03/01/2023)   PRAPARE - Administrator, Civil Service (Medical): No    Lack of Transportation (Non-Medical): No  Physical Activity: Sufficiently Active (03/01/2023)   Exercise Vital Sign    Days of Exercise per Week: 4 days    Minutes of Exercise per Session: 40 min  Stress: No Stress Concern Present (03/01/2023)   [employer] of Occupational Health -  Occupational Stress Questionnaire    Feeling of Stress : Not at all  Social Connections: Moderately Integrated (03/01/2023)   Social Connection and Isolation Panel    Frequency of Communication with Friends and Family: Three times a week    Frequency of Social Gatherings with Friends and Family: Twice a week    Attends Religious Services: More than 4 times per year    Active Member of Clubs or Organizations: Yes    Attends Banker Meetings: More than 4 times per year    Marital Status: Divorced  Intimate Partner Violence: Not At Risk (03/01/2023)   Humiliation, Afraid, Rape, and Kick questionnaire    Fear of Current or Ex-Partner: No    Emotionally Abused: No    Physically Abused: No    Sexually Abused: No  Depression (PHQ2-9): Medium Risk (11/01/2023)   Depression (PHQ2-9)    PHQ-2 Score: 5  Alcohol Screen: Low Risk (03/01/2023)   Alcohol Screen    Last Alcohol Screening Score (AUDIT): 0  Housing: Low Risk (03/01/2023)   Housing    Last Housing Risk Score: 0  Utilities: At Risk (03/01/2023)   AHC Utilities    Threatened with loss of utilities: Yes  Health Literacy: Adequate Health Literacy (03/01/2023)   B1300 Health Literacy    Frequency of need for help with medical instructions: Never   Current Outpatient Medications on File Prior to Visit  Medication Sig Dispense Refill   Accu-Chek Softclix Lancets lancets use one lancet to check blood  glucose three times a day 100 each 6   aspirin  EC 81 MG tablet Take 1 tablet (81 mg total) by mouth daily. Swallow whole. (Patient taking differently: Take 81 mg by mouth in the morning. Swallow whole.) 100 tablet 2   atorvastatin  (LIPITOR) 80 MG tablet TAKE 1 TABLET BY MOUTH DAILY 90 tablet 0   blood glucose meter kit and supplies KIT Dispense based on patient and insurance preference. Use up to four times daily as directed. (FOR ICD-9 250.00, 250.01). 1 each 0   Blood Glucose Monitoring Suppl (ACCU-CHEK GUIDE) w/Device KIT use to check blood glucose three times a day 1 kit 0   Cholecalciferol (VITAMIN D-3) 125 MCG (5000 UT) TABS Take 5,000 Units by mouth in the morning.     Continuous Glucose Sensor (FREESTYLE LIBRE 3 PLUS SENSOR) MISC Inject 1 Device into the skin continuous. Change every 15 days 6 each 3   dapagliflozin  propanediol (FARXIGA ) 5 MG TABS tablet Take 1 tablet (5 mg total) by mouth daily. 90 tablet 3   diphenoxylate -atropine  (LOMOTIL ) 2.5-0.025 MG tablet Take 1 tablet by mouth 4 (four) times daily as needed for diarrhea or loose stools. 12 tablet 0   fenofibrate  (TRICOR ) 48 MG tablet TAKE 1 TABLET BY MOUTH DAILY 90 tablet 1   gabapentin  (NEURONTIN ) 300 MG capsule TAKE THREE CAPSULES BY MOUTH EVERY NIGHT AT BEDTIME 90 capsule 6   glucose blood (ACCU-CHEK GUIDE TEST) test strip Use to check blood sugar 3 times daily. 100 each 6   insulin  glargine (LANTUS ) 100 unit/mL SOPN Inject 120 Units into the skin daily. 90 mL 3   Insulin  Pen Needle (TECHLITE PLUS PEN NEEDLES) 32G X 4 MM MISC 1 each by Does not apply route 3 (three) times daily. 300 each 6   lisinopril  (ZESTRIL ) 10 MG tablet Take 1 tablet (10 mg total) by mouth daily. 90 tablet 3   loratadine  (CLARITIN ) 10 MG tablet TAKE 1 TABLET BY MOUTH DAILY 30 tablet 6  metFORMIN  (GLUCOPHAGE -XR) 500 MG 24 hr tablet Take 4 tablets (2,000 mg total) by mouth daily. 360 tablet 3   metoprolol  tartrate (LOPRESSOR ) 25 MG tablet TAKE 1 TABLET BY  MOUTH 2 TIMES A DAY 180 tablet 1   Multiple Vitamins-Minerals (MULTIVITAMIN WITH MINERALS) tablet Take 1 tablet by mouth daily.     ondansetron  (ZOFRAN -ODT) 4 MG disintegrating tablet Take 1 tablet (4 mg total) by mouth every 8 (eight) hours as needed for nausea or vomiting. 10 tablet 0   pantoprazole  (PROTONIX ) 40 MG tablet TAKE 1 TABLET BY MOUTH 2 TIMES A DAY BEFORE A MEAL 180 tablet 2   ramelteon  (ROZEREM ) 8 MG tablet TAKE 1 TABLET BY MOUTH EVERY NIGHT AT BEDTIME STOP MELATONIN AND DECREASE TRAZODONE  TO 100MG  AT BEDTIME 30 tablet 1   Semaglutide , 2 MG/DOSE, (OZEMPIC , 2 MG/DOSE,) 8 MG/3ML SOPN INJECT 2MG  UNDER THE SKIN ONCE WEEKLY 6 mL 2   tirzepatide  (MOUNJARO ) 5 MG/0.5ML Pen Inject 5 mg into the skin once a week. 2 mL 3   traZODone  (DESYREL ) 100 MG tablet TAKE 2 TABLETS BY MOUTH AT BEDTIME 68 tablet 0   No current facility-administered medications on file prior to visit.   Allergies  Allergen Reactions   Wound Dressing Adhesive Other (See Comments)    Redness/irritation   Codeine Nausea And Vomiting    Pt is able to take percocet & vicodin   Latex Rash    irritation   Family History  Problem Relation Age of Onset   Depression Mother    Diabetes Mother    Arthritis Mother    Heart disease Mother    Learning disabilities Mother    Mental illness Mother    Liver cancer Mother        metastatic from colon??   Arthritis Father    Prostate cancer Father        met to esophagus   Colon polyps Sister    Cancer Maternal Aunt        x 2 Aunts   Breast cancer Maternal Grandmother    PE: LMP 03/13/2013  Wt Readings from Last 3 Encounters:  01/24/24 218 lb 3.2 oz (99 kg)  11/01/23 217 lb (98.4 kg)  09/22/23 218 lb 3.2 oz (99 kg)   Constitutional:  in NAD  The physical exam was not performed (virtual visit).  ASSESSMENT: 1. DM2, insulin -dependent, uncontrolled, with complications - PN  2. HL  PLAN:  1. Patient with longstanding, uncontrolled, type 2 diabetes, on oral  antidiabetic regimen with metformin  and SGLT2 inhibitor along with long-acting insulin  and weekly GLP-1 receptor agonist.  She previously inquired about switching from Ozempic  to Mounjaro  but this was not approved by her insurance unless she tried Victoza and Trulicity.  In that case, I advised her to remain on Ozempic .  Of note, she has a history of an exaggerated gastrocolic reflex and I did recommend to see GI for this.  We initially decreased metformin  but her diarrhea improved and she increased it back to the full dose. - At last visit, sugars were worse, more stable overnight but increasing after breakfast and fluctuating around the upper limit of normal after this meal, decreasing only after 12 AM.  We discussed about the need to change her diet as she was eating quite a lot of carbs but we also increase her Lantus  dose.  We tried to switch from Ozempic  to Mounjaro  but, as mentioned above, she ended up not being able to start this.  I refilled her  prescriptions at last visit. CGM interpretation: -At today's visit, we reviewed her CGM downloads: It appears that 80% of values are in target range (goal >70%), while 19% are higher than 180 (goal <25%), and 1% are lower than 70 (goal <4%).  The calculated average blood sugar is 154.  The projected HbA1c for the next 3 months (GMI) is 7.0%. -Reviewing the CGM trends, sugars appear to be overall improved from last visit, with higher blood sugars after dinner, but otherwise with sugars fluctuating mainly within the upper half of the target range.  She is taking a high dose of insulin , 120 units daily and I would not recommend to increase this.  Will also continue metformin  and Farxiga .  However, she was not able to switch from Ozempic  to Mounjaro  since last visit due to insurance coverage.  However, Mounjaro  was just approved for her so I sent a new prescription to her pharmacy for a month, advising her to get in touch with me if she tolerates it well so we can  increase the dose - I suggested to:  Patient Instructions  Please continue: - Metformin  ER 1000 mg 2x a day - Farxiga  5 mg before b'fast - Lantus  120 units daily in am   Please change from Ozempic  to: - Mounjaro  5 mg weekly - let me know if we can increase the dose.  You can try the following for neuropathy: - alpha-lipoic acid 600 mg twice a day  Please return in 3-4 months.   - we will check HbA1c at next visit. - advised to check sugars at different times of the day - 4x a day, rotating check times - advised for yearly eye exams >> she is not UTD - return to clinic in 3-4 months  2. HL - Latest lipid panel was reviewed from 02/2023: Fractions at goal with the exception of the low HDL: Lab Results  Component Value Date   CHOL 86 (L) 03/01/2023   HDL 37 (L) 03/01/2023   LDLCALC 29 03/01/2023   LDLDIRECT 95.5 12/02/2019   TRIG 103 03/01/2023   CHOLHDL 2.3 03/01/2023  -She continues on Lipitor 80 mg daily and Tricor  48 mg daily with good tolerance - She is due for another lipid panel -she just establish care with Dr. Remonia and will have a lipid panel at next visit with him  Lela Fendt, MD PhD [facility] Endocrinology

## 2024-06-15 NOTE — Patient Instructions (Addendum)
 Please continue: - Metformin  ER 1000 mg 2x a day - Farxiga  5 mg before b'fast - Lantus  120 units daily in am   Please change from Ozempic  to: - Mounjaro  5 mg weekly - let me know if we can increase the dose.  You can try the following for neuropathy: - alpha-lipoic acid 600 mg twice a day  Please return in 3-4 months.

## 2024-06-16 ENCOUNTER — Encounter: Payer: Self-pay | Admitting: Internal Medicine

## 2024-06-20 ENCOUNTER — Other Ambulatory Visit (HOSPITAL_COMMUNITY): Payer: Self-pay

## 2024-06-20 ENCOUNTER — Telehealth: Payer: Self-pay
